# Patient Record
Sex: Female | Born: 1940 | Race: White | Hispanic: No | Smoking: Former smoker
Health system: Southern US, Community
[De-identification: ages and names within clinical notes are randomized; demographics above are authoritative.]

## PROBLEM LIST (undated history)

## (undated) DIAGNOSIS — I509 Heart failure, unspecified: Secondary | ICD-10-CM

## (undated) DIAGNOSIS — Z973 Presence of spectacles and contact lenses: Secondary | ICD-10-CM

## (undated) DIAGNOSIS — T8859XA Other complications of anesthesia, initial encounter: Secondary | ICD-10-CM

## (undated) DIAGNOSIS — K219 Gastro-esophageal reflux disease without esophagitis: Secondary | ICD-10-CM

## (undated) DIAGNOSIS — E119 Type 2 diabetes mellitus without complications: Secondary | ICD-10-CM

## (undated) DIAGNOSIS — K08109 Complete loss of teeth, unspecified cause, unspecified class: Secondary | ICD-10-CM

## (undated) DIAGNOSIS — K5792 Diverticulitis of intestine, part unspecified, without perforation or abscess without bleeding: Secondary | ICD-10-CM

## (undated) DIAGNOSIS — E039 Hypothyroidism, unspecified: Secondary | ICD-10-CM

## (undated) DIAGNOSIS — M7581 Other shoulder lesions, right shoulder: Secondary | ICD-10-CM

## (undated) DIAGNOSIS — L900 Lichen sclerosus et atrophicus: Secondary | ICD-10-CM

## (undated) DIAGNOSIS — I340 Nonrheumatic mitral (valve) insufficiency: Secondary | ICD-10-CM

## (undated) DIAGNOSIS — M1990 Unspecified osteoarthritis, unspecified site: Secondary | ICD-10-CM

## (undated) DIAGNOSIS — T4145XA Adverse effect of unspecified anesthetic, initial encounter: Secondary | ICD-10-CM

## (undated) DIAGNOSIS — M109 Gout, unspecified: Secondary | ICD-10-CM

## (undated) DIAGNOSIS — J449 Chronic obstructive pulmonary disease, unspecified: Secondary | ICD-10-CM

## (undated) DIAGNOSIS — Z972 Presence of dental prosthetic device (complete) (partial): Secondary | ICD-10-CM

## (undated) DIAGNOSIS — I10 Essential (primary) hypertension: Secondary | ICD-10-CM

## (undated) HISTORY — PX: KIDNEY SURGERY: SHX687

## (undated) HISTORY — PX: LAPAROTOMY: SHX154

## (undated) HISTORY — PX: TONSILLECTOMY: SUR1361

## (undated) HISTORY — PX: CHOLECYSTECTOMY: SHX55

## (undated) HISTORY — DX: Type 2 diabetes mellitus without complications: E11.9

## (undated) HISTORY — DX: Lichen sclerosus et atrophicus: L90.0

---

## 1965-07-02 HISTORY — PX: ABDOMINAL HYSTERECTOMY: SHX81

## 1972-07-02 HISTORY — PX: BREAST EXCISIONAL BIOPSY: SUR124

## 2004-07-02 HISTORY — PX: JOINT REPLACEMENT: SHX530

## 2006-07-02 HISTORY — PX: JOINT REPLACEMENT: SHX530

## 2010-07-02 HISTORY — PX: CORRECTION OVERLAPPING TOES: SHX6615

## 2013-07-02 HISTORY — PX: HERNIA REPAIR: SHX51

## 2014-03-31 DIAGNOSIS — K5790 Diverticulosis of intestine, part unspecified, without perforation or abscess without bleeding: Secondary | ICD-10-CM | POA: Insufficient documentation

## 2014-03-31 DIAGNOSIS — I10 Essential (primary) hypertension: Secondary | ICD-10-CM | POA: Insufficient documentation

## 2014-03-31 DIAGNOSIS — E039 Hypothyroidism, unspecified: Secondary | ICD-10-CM | POA: Insufficient documentation

## 2014-03-31 DIAGNOSIS — M109 Gout, unspecified: Secondary | ICD-10-CM | POA: Insufficient documentation

## 2014-03-31 DIAGNOSIS — K219 Gastro-esophageal reflux disease without esophagitis: Secondary | ICD-10-CM | POA: Insufficient documentation

## 2014-03-31 DIAGNOSIS — M8580 Other specified disorders of bone density and structure, unspecified site: Secondary | ICD-10-CM | POA: Insufficient documentation

## 2014-06-20 ENCOUNTER — Inpatient Hospital Stay: Payer: Self-pay | Admitting: Surgery

## 2014-06-20 LAB — URINALYSIS, COMPLETE
BACTERIA: NONE SEEN
BILIRUBIN, UR: NEGATIVE
Blood: NEGATIVE
Glucose,UR: NEGATIVE mg/dL (ref 0–75)
Hyaline Cast: 2
Nitrite: NEGATIVE
PH: 6 (ref 4.5–8.0)
Protein: 100
RBC,UR: 5 /HPF (ref 0–5)
Specific Gravity: 1.021 (ref 1.003–1.030)
Squamous Epithelial: 2
WBC UR: 27 /HPF (ref 0–5)

## 2014-06-20 LAB — COMPREHENSIVE METABOLIC PANEL
ANION GAP: 10 (ref 7–16)
Albumin: 2.4 g/dL — ABNORMAL LOW (ref 3.4–5.0)
Alkaline Phosphatase: 122 U/L — ABNORMAL HIGH
BUN: 6 mg/dL — ABNORMAL LOW (ref 7–18)
Bilirubin,Total: 0.8 mg/dL (ref 0.2–1.0)
CHLORIDE: 101 mmol/L (ref 98–107)
Calcium, Total: 8.2 mg/dL — ABNORMAL LOW (ref 8.5–10.1)
Co2: 26 mmol/L (ref 21–32)
Creatinine: 1.05 mg/dL (ref 0.60–1.30)
EGFR (African American): >60
GFR CALC NON AF AMER: 54 — AB
Glucose: 222 mg/dL — ABNORMAL HIGH (ref 65–99)
Osmolality: 278 (ref 275–301)
Potassium: 2.7 mmol/L — ABNORMAL LOW (ref 3.5–5.1)
SGOT(AST): 19 U/L (ref 15–37)
SGPT (ALT): 24 U/L
Sodium: 137 mmol/L (ref 136–145)
Total Protein: 6.7 g/dL (ref 6.4–8.2)

## 2014-06-20 LAB — CBC
HCT: 36.9 % (ref 35.0–47.0)
HGB: 11.8 g/dL — AB (ref 12.0–16.0)
MCH: 30.1 pg (ref 26.0–34.0)
MCHC: 32 g/dL (ref 32.0–36.0)
MCV: 94 fL (ref 80–100)
PLATELETS: 281 10*3/uL (ref 150–440)
RBC: 3.92 10*6/uL (ref 3.80–5.20)
RDW: 13.8 % (ref 11.5–14.5)
WBC: 21.2 10*3/uL — ABNORMAL HIGH (ref 3.6–11.0)

## 2014-06-20 LAB — LIPASE, BLOOD: LIPASE: 107 U/L (ref 73–393)

## 2014-06-21 LAB — CBC WITH DIFFERENTIAL/PLATELET
BASOS ABS: 0.1 10*3/uL (ref 0.0–0.1)
Basophil %: 0.3 %
Eosinophil #: 0 10*3/uL (ref 0.0–0.7)
Eosinophil %: 0.2 %
HCT: 32.8 % — ABNORMAL LOW (ref 35.0–47.0)
HGB: 10.8 g/dL — ABNORMAL LOW (ref 12.0–16.0)
LYMPHS ABS: 0.3 10*3/uL — AB (ref 1.0–3.6)
LYMPHS PCT: 1.8 %
MCH: 31.1 pg (ref 26.0–34.0)
MCHC: 32.8 g/dL (ref 32.0–36.0)
MCV: 95 fL (ref 80–100)
MONO ABS: 1.5 x10 3/mm — AB (ref 0.2–0.9)
Monocyte %: 8.4 %
NEUTROS PCT: 89.3 %
Neutrophil #: 16.2 10*3/uL — ABNORMAL HIGH (ref 1.4–6.5)
PLATELETS: 236 10*3/uL (ref 150–440)
RBC: 3.47 10*6/uL — ABNORMAL LOW (ref 3.80–5.20)
RDW: 14 % (ref 11.5–14.5)
WBC: 18.1 10*3/uL — AB (ref 3.6–11.0)

## 2014-06-21 LAB — BASIC METABOLIC PANEL
Anion Gap: 9 (ref 7–16)
BUN: 10 mg/dL (ref 7–18)
CHLORIDE: 105 mmol/L (ref 98–107)
CREATININE: 1.1 mg/dL (ref 0.60–1.30)
Calcium, Total: 7.7 mg/dL — ABNORMAL LOW (ref 8.5–10.1)
Co2: 24 mmol/L (ref 21–32)
EGFR (African American): >60
EGFR (Non-African Amer.): 52 — ABNORMAL LOW
Glucose: 175 mg/dL — ABNORMAL HIGH (ref 65–99)
Osmolality: 279 (ref 275–301)
Potassium: 3.4 mmol/L — ABNORMAL LOW (ref 3.5–5.1)
SODIUM: 138 mmol/L (ref 136–145)

## 2014-06-21 LAB — PROTIME-INR
INR: 1.4
Prothrombin Time: 16.9 secs — ABNORMAL HIGH (ref 11.5–14.7)

## 2014-06-21 LAB — APTT: Activated PTT: 35.4 secs (ref 23.6–35.9)

## 2014-06-21 LAB — CLOSTRIDIUM DIFFICILE(ARMC)

## 2014-06-23 LAB — BASIC METABOLIC PANEL
ANION GAP: 6 — AB (ref 7–16)
BUN: 8 mg/dL (ref 7–18)
CHLORIDE: 110 mmol/L — AB (ref 98–107)
Calcium, Total: 7.9 mg/dL — ABNORMAL LOW (ref 8.5–10.1)
Co2: 24 mmol/L (ref 21–32)
Creatinine: 0.94 mg/dL (ref 0.60–1.30)
EGFR (African American): >60
EGFR (Non-African Amer.): >60
GLUCOSE: 140 mg/dL — AB (ref 65–99)
Osmolality: 280 (ref 275–301)
POTASSIUM: 3.8 mmol/L (ref 3.5–5.1)
SODIUM: 140 mmol/L (ref 136–145)

## 2014-06-23 LAB — CBC WITH DIFFERENTIAL/PLATELET
BASOS ABS: 0 10*3/uL (ref 0.0–0.1)
BASOS PCT: 0.3 %
EOS ABS: 0.2 10*3/uL (ref 0.0–0.7)
Eosinophil %: 1.8 %
HCT: 29.6 % — ABNORMAL LOW (ref 35.0–47.0)
HGB: 9.7 g/dL — ABNORMAL LOW (ref 12.0–16.0)
Lymphocyte #: 0.9 10*3/uL — ABNORMAL LOW (ref 1.0–3.6)
Lymphocyte %: 9.9 %
MCH: 31 pg (ref 26.0–34.0)
MCHC: 32.8 g/dL (ref 32.0–36.0)
MCV: 95 fL (ref 80–100)
Monocyte #: 1 x10 3/mm — ABNORMAL HIGH (ref 0.2–0.9)
Monocyte %: 11.8 %
Neutrophil #: 6.6 10*3/uL — ABNORMAL HIGH (ref 1.4–6.5)
Neutrophil %: 76.2 %
PLATELETS: 212 10*3/uL (ref 150–440)
RBC: 3.13 10*6/uL — ABNORMAL LOW (ref 3.80–5.20)
RDW: 14.1 % (ref 11.5–14.5)
WBC: 8.7 10*3/uL (ref 3.6–11.0)

## 2014-06-24 LAB — BASIC METABOLIC PANEL
Anion Gap: 9 (ref 7–16)
BUN: 8 mg/dL (ref 7–18)
Calcium, Total: 7.8 mg/dL — ABNORMAL LOW (ref 8.5–10.1)
Chloride: 106 mmol/L (ref 98–107)
Co2: 26 mmol/L (ref 21–32)
Creatinine: 0.88 mg/dL (ref 0.60–1.30)
EGFR (African American): >60
EGFR (Non-African Amer.): >60
Glucose: 125 mg/dL — ABNORMAL HIGH (ref 65–99)
OSMOLALITY: 281 (ref 275–301)
Potassium: 2.9 mmol/L — ABNORMAL LOW (ref 3.5–5.1)
Sodium: 141 mmol/L (ref 136–145)

## 2014-06-24 LAB — CBC WITH DIFFERENTIAL/PLATELET
Basophil #: 0 10*3/uL (ref 0.0–0.1)
Basophil %: 0.4 %
EOS PCT: 1.5 %
Eosinophil #: 0.1 10*3/uL (ref 0.0–0.7)
HCT: 33.2 % — ABNORMAL LOW (ref 35.0–47.0)
HGB: 10.8 g/dL — ABNORMAL LOW (ref 12.0–16.0)
LYMPHS ABS: 0.8 10*3/uL — AB (ref 1.0–3.6)
Lymphocyte %: 10.5 %
MCH: 30.9 pg (ref 26.0–34.0)
MCHC: 32.5 g/dL (ref 32.0–36.0)
MCV: 95 fL (ref 80–100)
Monocyte #: 0.9 x10 3/mm (ref 0.2–0.9)
Monocyte %: 12.1 %
Neutrophil #: 5.5 10*3/uL (ref 1.4–6.5)
Neutrophil %: 75.5 %
Platelet: 231 10*3/uL (ref 150–440)
RBC: 3.49 10*6/uL — ABNORMAL LOW (ref 3.80–5.20)
RDW: 14.1 % (ref 11.5–14.5)
WBC: 7.2 10*3/uL (ref 3.6–11.0)

## 2014-06-24 LAB — MAGNESIUM: MAGNESIUM: 1.6 mg/dL — AB

## 2014-06-24 LAB — IRON AND TIBC
IRON SATURATION: 25 %
Iron Bind.Cap.(Total): 134 ug/dL — ABNORMAL LOW (ref 250–450)
Iron: 34 ug/dL — ABNORMAL LOW (ref 50–170)
Unbound Iron-Bind.Cap.: 100 ug/dL

## 2014-06-24 LAB — FERRITIN: FERRITIN (ARMC): 577 ng/mL — AB (ref 8–388)

## 2014-06-25 LAB — CBC WITH DIFFERENTIAL/PLATELET
Basophil #: 0.1 10*3/uL (ref 0.0–0.1)
Basophil %: 0.7 %
EOS ABS: 0.2 10*3/uL (ref 0.0–0.7)
Eosinophil %: 2.1 %
HCT: 35.6 % (ref 35.0–47.0)
HGB: 11.5 g/dL — AB (ref 12.0–16.0)
LYMPHS ABS: 1.3 10*3/uL (ref 1.0–3.6)
Lymphocyte %: 17.7 %
MCH: 30.9 pg (ref 26.0–34.0)
MCHC: 32.3 g/dL (ref 32.0–36.0)
MCV: 96 fL (ref 80–100)
MONO ABS: 1 x10 3/mm — AB (ref 0.2–0.9)
Monocyte %: 12.5 %
Neutrophil #: 5.1 10*3/uL (ref 1.4–6.5)
Neutrophil %: 67 %
PLATELETS: 275 10*3/uL (ref 150–440)
RBC: 3.72 10*6/uL — AB (ref 3.80–5.20)
RDW: 14.2 % (ref 11.5–14.5)
WBC: 7.6 10*3/uL (ref 3.6–11.0)

## 2014-06-25 LAB — URINALYSIS, COMPLETE
BILIRUBIN, UR: NEGATIVE
Glucose,UR: NEGATIVE mg/dL (ref 0–75)
KETONE: NEGATIVE
NITRITE: NEGATIVE
PH: 7 (ref 4.5–8.0)
Protein: NEGATIVE
RBC,UR: 5 /HPF (ref 0–5)
Specific Gravity: 1.002 (ref 1.003–1.030)
WBC UR: 9 /HPF (ref 0–5)

## 2014-06-25 LAB — COMPREHENSIVE METABOLIC PANEL
ALK PHOS: 115 U/L
ALT: 37 U/L
ANION GAP: 8 (ref 7–16)
Albumin: 1.9 g/dL — ABNORMAL LOW (ref 3.4–5.0)
BILIRUBIN TOTAL: 0.4 mg/dL (ref 0.2–1.0)
BUN: 8 mg/dL (ref 7–18)
CHLORIDE: 108 mmol/L — AB (ref 98–107)
CREATININE: 0.91 mg/dL (ref 0.60–1.30)
Calcium, Total: 8.4 mg/dL — ABNORMAL LOW (ref 8.5–10.1)
Co2: 29 mmol/L (ref 21–32)
EGFR (African American): >60
EGFR (Non-African Amer.): >60
Glucose: 110 mg/dL — ABNORMAL HIGH (ref 65–99)
Osmolality: 288 (ref 275–301)
Potassium: 4.4 mmol/L (ref 3.5–5.1)
SGOT(AST): 47 U/L — ABNORMAL HIGH (ref 15–37)
Sodium: 145 mmol/L (ref 136–145)
TOTAL PROTEIN: 5.8 g/dL — AB (ref 6.4–8.2)

## 2014-06-27 DIAGNOSIS — I340 Nonrheumatic mitral (valve) insufficiency: Secondary | ICD-10-CM

## 2014-06-27 LAB — CBC WITH DIFFERENTIAL/PLATELET
BASOS ABS: 0.1 10*3/uL (ref 0.0–0.1)
BASOS PCT: 0.8 %
Basophil #: 0.1 10*3/uL (ref 0.0–0.1)
Basophil %: 0.5 %
Eosinophil #: 0.2 10*3/uL (ref 0.0–0.7)
Eosinophil #: 0.2 10*3/uL (ref 0.0–0.7)
Eosinophil %: 1.9 %
Eosinophil %: 1.6 %
HCT: 36 % (ref 35.0–47.0)
HCT: 35.2 % (ref 35.0–47.0)
HGB: 11.6 g/dL — AB (ref 12.0–16.0)
HGB: 11.2 g/dL — ABNORMAL LOW (ref 12.0–16.0)
Lymphocyte #: 1.5 10*3/uL (ref 1.0–3.6)
Lymphocyte #: 0.9 10*3/uL — ABNORMAL LOW (ref 1.0–3.6)
Lymphocyte %: 12.8 %
Lymphocyte %: 8.4 %
MCH: 30.5 pg (ref 26.0–34.0)
MCH: 30.3 pg (ref 26.0–34.0)
MCHC: 32.2 g/dL (ref 32.0–36.0)
MCHC: 31.8 g/dL — ABNORMAL LOW (ref 32.0–36.0)
MCV: 95 fL (ref 80–100)
MCV: 95 fL (ref 80–100)
MONO ABS: 1.1 x10 3/mm — AB (ref 0.2–0.9)
Monocyte #: 1.2 x10 3/mm — ABNORMAL HIGH (ref 0.2–0.9)
Monocyte %: 10.4 %
Monocyte %: 10.4 %
NEUTROS PCT: 79.1 %
Neutrophil #: 8.6 10*3/uL — ABNORMAL HIGH (ref 1.4–6.5)
Neutrophil #: 8.3 10*3/uL — ABNORMAL HIGH (ref 1.4–6.5)
Neutrophil %: 74.1 %
Platelet: 309 10*3/uL (ref 150–440)
Platelet: 308 10*3/uL (ref 150–440)
RBC: 3.8 10*6/uL (ref 3.80–5.20)
RBC: 3.7 10*6/uL — ABNORMAL LOW (ref 3.80–5.20)
RDW: 14.4 % (ref 11.5–14.5)
RDW: 14.3 % (ref 11.5–14.5)
WBC: 11.6 10*3/uL — ABNORMAL HIGH (ref 3.6–11.0)
WBC: 10.5 10*3/uL (ref 3.6–11.0)

## 2014-06-27 LAB — COMPREHENSIVE METABOLIC PANEL
ALT: 30 U/L
ANION GAP: 9 (ref 7–16)
AST: 26 U/L (ref 15–37)
Albumin: 2 g/dL — ABNORMAL LOW (ref 3.4–5.0)
Alkaline Phosphatase: 106 U/L
BUN: 9 mg/dL (ref 7–18)
Bilirubin,Total: 0.4 mg/dL (ref 0.2–1.0)
CALCIUM: 8.5 mg/dL (ref 8.5–10.1)
CREATININE: 0.94 mg/dL (ref 0.60–1.30)
Chloride: 103 mmol/L (ref 98–107)
Co2: 27 mmol/L (ref 21–32)
EGFR (African American): >60
GLUCOSE: 104 mg/dL — AB (ref 65–99)
Osmolality: 277 (ref 275–301)
Potassium: 3.9 mmol/L (ref 3.5–5.1)
SODIUM: 139 mmol/L (ref 136–145)
Total Protein: 6.2 g/dL — ABNORMAL LOW (ref 6.4–8.2)

## 2014-06-29 LAB — URINE CULTURE

## 2014-06-30 LAB — MAGNESIUM: Magnesium: 2.4 mg/dL

## 2014-07-02 LAB — CREATININE, SERUM
Creatinine: 0.91 mg/dL (ref 0.60–1.30)
EGFR (African American): >60
EGFR (Non-African Amer.): >60

## 2014-07-03 HISTORY — PX: COLECTOMY WITH COLOSTOMY CREATION/HARTMANN PROCEDURE: SHX6598

## 2014-07-03 LAB — PLATELET COUNT: PLATELETS: 273 10*3/uL (ref 150–440)

## 2014-07-04 LAB — CBC WITH DIFFERENTIAL/PLATELET
Basophil #: 0 10*3/uL (ref 0.0–0.1)
Basophil %: 0.1 %
EOS ABS: 0 10*3/uL (ref 0.0–0.7)
Eosinophil %: 0 %
HCT: 30.5 % — ABNORMAL LOW (ref 35.0–47.0)
HGB: 9.6 g/dL — ABNORMAL LOW (ref 12.0–16.0)
Lymphocyte #: 0.5 10*3/uL — ABNORMAL LOW (ref 1.0–3.6)
Lymphocyte %: 2.9 %
MCH: 30.1 pg (ref 26.0–34.0)
MCHC: 31.6 g/dL — AB (ref 32.0–36.0)
MCV: 95 fL (ref 80–100)
MONO ABS: 1.2 x10 3/mm — AB (ref 0.2–0.9)
Monocyte %: 7 %
NEUTROS PCT: 90 %
Neutrophil #: 15.2 10*3/uL — ABNORMAL HIGH (ref 1.4–6.5)
Platelet: 263 10*3/uL (ref 150–440)
RBC: 3.21 10*6/uL — ABNORMAL LOW (ref 3.80–5.20)
RDW: 14.8 % — ABNORMAL HIGH (ref 11.5–14.5)
WBC: 16.9 10*3/uL — ABNORMAL HIGH (ref 3.6–11.0)

## 2014-07-04 LAB — COMPREHENSIVE METABOLIC PANEL
ALBUMIN: 1.8 g/dL — AB (ref 3.4–5.0)
ALK PHOS: 82 U/L
ALT: 19 U/L
Anion Gap: 5 — ABNORMAL LOW (ref 7–16)
BILIRUBIN TOTAL: 0.4 mg/dL (ref 0.2–1.0)
BUN: 17 mg/dL (ref 7–18)
CO2: 28 mmol/L (ref 21–32)
CREATININE: 0.86 mg/dL (ref 0.60–1.30)
Calcium, Total: 8.3 mg/dL — ABNORMAL LOW (ref 8.5–10.1)
Chloride: 105 mmol/L (ref 98–107)
EGFR (African American): >60
EGFR (Non-African Amer.): >60
Glucose: 153 mg/dL — ABNORMAL HIGH (ref 65–99)
Osmolality: 280 (ref 275–301)
Potassium: 4.9 mmol/L (ref 3.5–5.1)
SGOT(AST): 27 U/L (ref 15–37)
SODIUM: 138 mmol/L (ref 136–145)
TOTAL PROTEIN: 5.8 g/dL — AB (ref 6.4–8.2)

## 2014-07-05 LAB — CBC WITH DIFFERENTIAL/PLATELET
BASOS ABS: 0 10*3/uL (ref 0.0–0.1)
Basophil %: 0.1 %
Eosinophil #: 0 10*3/uL (ref 0.0–0.7)
Eosinophil %: 0 %
HCT: 28.9 % — ABNORMAL LOW (ref 35.0–47.0)
HGB: 9 g/dL — ABNORMAL LOW (ref 12.0–16.0)
LYMPHS ABS: 0.6 10*3/uL — AB (ref 1.0–3.6)
Lymphocyte %: 3.6 %
MCH: 29.5 pg (ref 26.0–34.0)
MCHC: 31 g/dL — AB (ref 32.0–36.0)
MCV: 95 fL (ref 80–100)
Monocyte #: 0.9 x10 3/mm (ref 0.2–0.9)
Monocyte %: 4.9 %
NEUTROS ABS: 16.1 10*3/uL — AB (ref 1.4–6.5)
Neutrophil %: 91.4 %
Platelet: 251 10*3/uL (ref 150–440)
RBC: 3.04 10*6/uL — ABNORMAL LOW (ref 3.80–5.20)
RDW: 14.7 % — AB (ref 11.5–14.5)
WBC: 17.6 10*3/uL — AB (ref 3.6–11.0)

## 2014-07-05 LAB — BASIC METABOLIC PANEL
ANION GAP: 5 — AB (ref 7–16)
BUN: 13 mg/dL (ref 7–18)
CO2: 32 mmol/L (ref 21–32)
Calcium, Total: 8.4 mg/dL — ABNORMAL LOW (ref 8.5–10.1)
Chloride: 101 mmol/L (ref 98–107)
Creatinine: 0.75 mg/dL (ref 0.60–1.30)
Glucose: 105 mg/dL — ABNORMAL HIGH (ref 65–99)
Osmolality: 276 (ref 275–301)
Potassium: 4.3 mmol/L (ref 3.5–5.1)
SODIUM: 138 mmol/L (ref 136–145)

## 2014-07-05 LAB — MAGNESIUM: MAGNESIUM: 1.9 mg/dL

## 2014-07-05 LAB — PHOSPHORUS: Phosphorus: 2.7 mg/dL (ref 2.5–4.9)

## 2014-07-06 LAB — TPN PANEL
ALBUMIN: 1.8 g/dL — AB (ref 3.4–5.0)
Activated PTT: 41.9 secs — ABNORMAL HIGH (ref 23.6–35.9)
Alkaline Phosphatase: 116 U/L
Anion Gap: 6 — ABNORMAL LOW (ref 7–16)
BUN: 13 mg/dL (ref 7–18)
CHLORIDE: 98 mmol/L (ref 98–107)
CHOLESTEROL: 147 mg/dL (ref 0–200)
Calcium, Total: 8.1 mg/dL — ABNORMAL LOW (ref 8.5–10.1)
Co2: 35 mmol/L — ABNORMAL HIGH (ref 21–32)
Creatinine: 0.93 mg/dL (ref 0.60–1.30)
Glucose: 131 mg/dL — ABNORMAL HIGH (ref 65–99)
HGB: 9.7 g/dL — ABNORMAL LOW (ref 12.0–16.0)
INR: 1.3
Magnesium: 1.9 mg/dL
Osmolality: 279 (ref 275–301)
PROTHROMBIN TIME: 15.9 s — AB (ref 11.5–14.7)
Phosphorus: 2.5 mg/dL (ref 2.5–4.9)
Platelet: 272 10*3/uL (ref 150–440)
Potassium: 4 mmol/L (ref 3.5–5.1)
SGOT(AST): 122 U/L — ABNORMAL HIGH (ref 15–37)
Sodium: 139 mmol/L (ref 136–145)
Total Protein: 5.3 g/dL — ABNORMAL LOW (ref 6.4–8.2)
Triglycerides: 194 mg/dL (ref 0–200)
WBC: 14.7 10*3/uL — ABNORMAL HIGH (ref 3.6–11.0)

## 2014-07-06 LAB — CBC WITH DIFFERENTIAL/PLATELET
BASOS ABS: 0 10*3/uL (ref 0.0–0.1)
Basophil %: 0.2 %
EOS PCT: 0.2 %
Eosinophil #: 0 10*3/uL (ref 0.0–0.7)
HCT: 30.3 % — AB (ref 35.0–47.0)
LYMPHS ABS: 1 10*3/uL (ref 1.0–3.6)
Lymphocyte %: 6.8 %
MCH: 30.4 pg (ref 26.0–34.0)
MCHC: 32.2 g/dL (ref 32.0–36.0)
MCV: 94 fL (ref 80–100)
MONOS PCT: 5 %
Monocyte #: 0.7 x10 3/mm (ref 0.2–0.9)
NEUTROS ABS: 12.9 10*3/uL — AB (ref 1.4–6.5)
Neutrophil %: 87.8 %
RBC: 3.21 10*6/uL — AB (ref 3.80–5.20)
RDW: 14.8 % — ABNORMAL HIGH (ref 11.5–14.5)

## 2014-07-07 LAB — BASIC METABOLIC PANEL
Anion Gap: 7 (ref 7–16)
BUN: 15 mg/dL (ref 7–18)
CALCIUM: 8.1 mg/dL — AB (ref 8.5–10.1)
CHLORIDE: 103 mmol/L (ref 98–107)
Co2: 32 mmol/L (ref 21–32)
Creatinine: 0.74 mg/dL (ref 0.60–1.30)
EGFR (African American): >60
EGFR (Non-African Amer.): >60
Glucose: 148 mg/dL — ABNORMAL HIGH (ref 65–99)
Osmolality: 287 (ref 275–301)
Potassium: 3.5 mmol/L (ref 3.5–5.1)
SODIUM: 142 mmol/L (ref 136–145)

## 2014-07-07 LAB — MAGNESIUM: Magnesium: 2 mg/dL

## 2014-07-07 LAB — PHOSPHORUS: Phosphorus: 2.7 mg/dL (ref 2.5–4.9)

## 2014-07-08 LAB — BASIC METABOLIC PANEL
Anion Gap: 6 — ABNORMAL LOW (ref 7–16)
BUN: 16 mg/dL (ref 7–18)
CHLORIDE: 102 mmol/L (ref 98–107)
CO2: 31 mmol/L (ref 21–32)
Calcium, Total: 8 mg/dL — ABNORMAL LOW (ref 8.5–10.1)
Creatinine: 0.96 mg/dL (ref 0.60–1.30)
EGFR (African American): >60
EGFR (Non-African Amer.): >60
Glucose: 185 mg/dL — ABNORMAL HIGH (ref 65–99)
OSMOLALITY: 284 (ref 275–301)
POTASSIUM: 3.6 mmol/L (ref 3.5–5.1)
Sodium: 139 mmol/L (ref 136–145)

## 2014-07-08 LAB — PHOSPHORUS: Phosphorus: 3 mg/dL (ref 2.5–4.9)

## 2014-07-08 LAB — MAGNESIUM: Magnesium: 2.1 mg/dL

## 2014-07-09 LAB — BASIC METABOLIC PANEL
ANION GAP: 5 — AB (ref 7–16)
BUN: 18 mg/dL (ref 7–18)
CHLORIDE: 106 mmol/L (ref 98–107)
CREATININE: 0.75 mg/dL (ref 0.60–1.30)
Calcium, Total: 8.1 mg/dL — ABNORMAL LOW (ref 8.5–10.1)
Co2: 31 mmol/L (ref 21–32)
EGFR (Non-African Amer.): >60
Glucose: 192 mg/dL — ABNORMAL HIGH (ref 65–99)
Osmolality: 290 (ref 275–301)
Potassium: 3.8 mmol/L (ref 3.5–5.1)
Sodium: 142 mmol/L (ref 136–145)

## 2014-07-09 LAB — PHOSPHORUS: Phosphorus: 2.9 mg/dL (ref 2.5–4.9)

## 2014-07-09 LAB — MAGNESIUM: Magnesium: 2.2 mg/dL

## 2014-07-10 LAB — BASIC METABOLIC PANEL
Anion Gap: 6 — ABNORMAL LOW (ref 7–16)
BUN: 19 mg/dL — AB (ref 7–18)
CO2: 29 mmol/L (ref 21–32)
Calcium, Total: 8.6 mg/dL (ref 8.5–10.1)
Chloride: 103 mmol/L (ref 98–107)
Creatinine: 0.8 mg/dL (ref 0.60–1.30)
EGFR (Non-African Amer.): >60
Glucose: 223 mg/dL — ABNORMAL HIGH (ref 65–99)
Osmolality: 285 (ref 275–301)
Potassium: 3.9 mmol/L (ref 3.5–5.1)
SODIUM: 138 mmol/L (ref 136–145)

## 2014-07-10 LAB — CBC WITH DIFFERENTIAL/PLATELET
BASOS PCT: 0.5 %
Basophil #: 0.1 10*3/uL (ref 0.0–0.1)
EOS ABS: 0.5 10*3/uL (ref 0.0–0.7)
Eosinophil %: 4.4 %
HCT: 27.9 % — AB (ref 35.0–47.0)
HGB: 9 g/dL — AB (ref 12.0–16.0)
Lymphocyte #: 1.1 10*3/uL (ref 1.0–3.6)
Lymphocyte %: 9.1 %
MCH: 30.3 pg (ref 26.0–34.0)
MCHC: 32.2 g/dL (ref 32.0–36.0)
MCV: 94 fL (ref 80–100)
Monocyte #: 1.1 x10 3/mm — ABNORMAL HIGH (ref 0.2–0.9)
Monocyte %: 9.5 %
NEUTROS ABS: 9.1 10*3/uL — AB (ref 1.4–6.5)
NEUTROS PCT: 76.5 %
Platelet: 291 10*3/uL (ref 150–440)
RBC: 2.96 10*6/uL — ABNORMAL LOW (ref 3.80–5.20)
RDW: 15 % — ABNORMAL HIGH (ref 11.5–14.5)
WBC: 11.9 10*3/uL — ABNORMAL HIGH (ref 3.6–11.0)

## 2014-07-10 LAB — PHOSPHORUS: Phosphorus: 2.7 mg/dL (ref 2.5–4.9)

## 2014-07-10 LAB — MAGNESIUM: MAGNESIUM: 2.1 mg/dL

## 2014-07-11 LAB — BASIC METABOLIC PANEL
ANION GAP: 6 — AB (ref 7–16)
BUN: 25 mg/dL — ABNORMAL HIGH (ref 7–18)
CO2: 28 mmol/L (ref 21–32)
CREATININE: 0.97 mg/dL (ref 0.60–1.30)
Calcium, Total: 7.9 mg/dL — ABNORMAL LOW (ref 8.5–10.1)
Chloride: 99 mmol/L (ref 98–107)
EGFR (African American): >60
EGFR (Non-African Amer.): 60 — ABNORMAL LOW
Glucose: 403 mg/dL — ABNORMAL HIGH (ref 65–99)
OSMOLALITY: 288 (ref 275–301)
POTASSIUM: 5 mmol/L (ref 3.5–5.1)
Sodium: 133 mmol/L — ABNORMAL LOW (ref 136–145)

## 2014-07-11 LAB — CBC WITH DIFFERENTIAL/PLATELET
BASOS ABS: 0.1 10*3/uL (ref 0.0–0.1)
Basophil %: 0.4 %
Eosinophil #: 0 10*3/uL (ref 0.0–0.7)
Eosinophil %: 0.1 %
HCT: 29.5 % — AB (ref 35.0–47.0)
HGB: 9.3 g/dL — ABNORMAL LOW (ref 12.0–16.0)
Lymphocyte #: 0.6 10*3/uL — ABNORMAL LOW (ref 1.0–3.6)
Lymphocyte %: 3 %
MCH: 29.7 pg (ref 26.0–34.0)
MCHC: 31.5 g/dL — AB (ref 32.0–36.0)
MCV: 94 fL (ref 80–100)
Monocyte #: 1.6 x10 3/mm — ABNORMAL HIGH (ref 0.2–0.9)
Monocyte %: 8.5 %
Neutrophil #: 16.6 10*3/uL — ABNORMAL HIGH (ref 1.4–6.5)
Neutrophil %: 88 %
PLATELETS: 310 10*3/uL (ref 150–440)
RBC: 3.13 10*6/uL — AB (ref 3.80–5.20)
RDW: 15.1 % — AB (ref 11.5–14.5)
WBC: 18.9 10*3/uL — AB (ref 3.6–11.0)

## 2014-07-11 LAB — PHOSPHORUS: Phosphorus: 3.8 mg/dL (ref 2.5–4.9)

## 2014-07-11 LAB — MAGNESIUM: Magnesium: 2.1 mg/dL

## 2014-07-11 LAB — POTASSIUM: Potassium: 4.5 mmol/L (ref 3.5–5.1)

## 2014-07-12 LAB — CBC WITH DIFFERENTIAL/PLATELET
BASOS ABS: 0 10*3/uL (ref 0.0–0.1)
Basophil %: 0.4 %
EOS PCT: 1.8 %
Eosinophil #: 0.2 10*3/uL (ref 0.0–0.7)
HCT: 26 % — AB (ref 35.0–47.0)
HGB: 8.2 g/dL — ABNORMAL LOW (ref 12.0–16.0)
LYMPHS PCT: 8.3 %
Lymphocyte #: 1.1 10*3/uL (ref 1.0–3.6)
MCH: 29.3 pg (ref 26.0–34.0)
MCHC: 31.4 g/dL — ABNORMAL LOW (ref 32.0–36.0)
MCV: 93 fL (ref 80–100)
MONOS PCT: 9.8 %
Monocyte #: 1.3 x10 3/mm — ABNORMAL HIGH (ref 0.2–0.9)
Neutrophil #: 10.5 10*3/uL — ABNORMAL HIGH (ref 1.4–6.5)
Neutrophil %: 79.7 %
Platelet: 284 10*3/uL (ref 150–440)
RBC: 2.79 10*6/uL — AB (ref 3.80–5.20)
RDW: 14.9 % — AB (ref 11.5–14.5)
WBC: 13.2 10*3/uL — AB (ref 3.6–11.0)

## 2014-07-12 LAB — BASIC METABOLIC PANEL
Anion Gap: 3 — ABNORMAL LOW (ref 7–16)
BUN: 24 mg/dL — ABNORMAL HIGH (ref 7–18)
CALCIUM: 8.4 mg/dL — AB (ref 8.5–10.1)
CHLORIDE: 104 mmol/L (ref 98–107)
Co2: 32 mmol/L (ref 21–32)
Creatinine: 0.82 mg/dL (ref 0.60–1.30)
EGFR (African American): >60
GLUCOSE: 199 mg/dL — AB (ref 65–99)
OSMOLALITY: 287 (ref 275–301)
POTASSIUM: 4.5 mmol/L (ref 3.5–5.1)
SODIUM: 139 mmol/L (ref 136–145)

## 2014-07-12 LAB — COMPREHENSIVE METABOLIC PANEL
ALBUMIN: 1.4 g/dL — AB (ref 3.4–5.0)
Alkaline Phosphatase: 135 U/L — ABNORMAL HIGH
BILIRUBIN TOTAL: 0.4 mg/dL (ref 0.2–1.0)
SGOT(AST): 26 U/L (ref 15–37)
SGPT (ALT): 43 U/L
Total Protein: 5.7 g/dL — ABNORMAL LOW (ref 6.4–8.2)

## 2014-07-12 LAB — PHOSPHORUS: PHOSPHORUS: 3.1 mg/dL (ref 2.5–4.9)

## 2014-07-12 LAB — MAGNESIUM: Magnesium: 2 mg/dL

## 2014-07-13 LAB — PHOSPHORUS: Phosphorus: 3.2 mg/dL (ref 2.5–4.9)

## 2014-07-13 LAB — CBC WITH DIFFERENTIAL/PLATELET
BASOS PCT: 0.6 %
Basophil #: 0.1 10*3/uL (ref 0.0–0.1)
EOS PCT: 3.5 %
Eosinophil #: 0.4 10*3/uL (ref 0.0–0.7)
HCT: 24.9 % — AB (ref 35.0–47.0)
HGB: 7.8 g/dL — AB (ref 12.0–16.0)
LYMPHS PCT: 9.5 %
Lymphocyte #: 1.2 10*3/uL (ref 1.0–3.6)
MCH: 29.4 pg (ref 26.0–34.0)
MCHC: 31.2 g/dL — ABNORMAL LOW (ref 32.0–36.0)
MCV: 94 fL (ref 80–100)
MONOS PCT: 9.6 %
Monocyte #: 1.2 x10 3/mm — ABNORMAL HIGH (ref 0.2–0.9)
NEUTROS ABS: 9.9 10*3/uL — AB (ref 1.4–6.5)
Neutrophil %: 76.8 %
Platelet: 276 10*3/uL (ref 150–440)
RBC: 2.65 10*6/uL — AB (ref 3.80–5.20)
RDW: 15.2 % — ABNORMAL HIGH (ref 11.5–14.5)
WBC: 12.8 10*3/uL — ABNORMAL HIGH (ref 3.6–11.0)

## 2014-07-13 LAB — BASIC METABOLIC PANEL
ANION GAP: 6 — AB (ref 7–16)
BUN: 20 mg/dL — AB (ref 7–18)
CO2: 29 mmol/L (ref 21–32)
CREATININE: 0.84 mg/dL (ref 0.60–1.30)
Calcium, Total: 7.8 mg/dL — ABNORMAL LOW (ref 8.5–10.1)
Chloride: 104 mmol/L (ref 98–107)
Glucose: 181 mg/dL — ABNORMAL HIGH (ref 65–99)
Osmolality: 285 (ref 275–301)
Potassium: 4.3 mmol/L (ref 3.5–5.1)
Sodium: 139 mmol/L (ref 136–145)

## 2014-07-13 LAB — MAGNESIUM: Magnesium: 1.8 mg/dL

## 2014-07-14 LAB — BASIC METABOLIC PANEL
Anion Gap: 6 — ABNORMAL LOW (ref 7–16)
BUN: 18 mg/dL (ref 7–18)
CALCIUM: 8.2 mg/dL — AB (ref 8.5–10.1)
CO2: 28 mmol/L (ref 21–32)
Chloride: 101 mmol/L (ref 98–107)
Creatinine: 0.82 mg/dL (ref 0.60–1.30)
GLUCOSE: 184 mg/dL — AB (ref 65–99)
OSMOLALITY: 277 (ref 275–301)
Potassium: 4.1 mmol/L (ref 3.5–5.1)
SODIUM: 135 mmol/L — AB (ref 136–145)

## 2014-07-14 LAB — HEMOGLOBIN: HGB: 8 g/dL — ABNORMAL LOW (ref 12.0–16.0)

## 2014-07-14 LAB — MAGNESIUM: Magnesium: 1.8 mg/dL

## 2014-07-14 LAB — PHOSPHORUS: Phosphorus: 3.3 mg/dL

## 2014-07-15 LAB — BASIC METABOLIC PANEL
ANION GAP: 8 (ref 7–16)
BUN: 16 mg/dL (ref 7–18)
CREATININE: 0.75 mg/dL (ref 0.60–1.30)
Calcium, Total: 8.4 mg/dL — ABNORMAL LOW (ref 8.5–10.1)
Chloride: 103 mmol/L (ref 98–107)
Co2: 26 mmol/L (ref 21–32)
EGFR (African American): >60
Glucose: 179 mg/dL — ABNORMAL HIGH (ref 65–99)
Osmolality: 279 (ref 275–301)
Potassium: 4.1 mmol/L (ref 3.5–5.1)
Sodium: 137 mmol/L (ref 136–145)

## 2014-07-15 LAB — CBC WITH DIFFERENTIAL/PLATELET
Eosinophil: 3 %
HCT: 25.5 % — ABNORMAL LOW (ref 35.0–47.0)
HGB: 8.1 g/dL — ABNORMAL LOW (ref 12.0–16.0)
LYMPHS PCT: 8 %
MCH: 29.6 pg (ref 26.0–34.0)
MCHC: 31.7 g/dL — ABNORMAL LOW (ref 32.0–36.0)
MCV: 93 fL (ref 80–100)
MONOS PCT: 4 %
MYELOCYTE: 2 %
Metamyelocyte: 2 %
Platelet: 329 10*3/uL (ref 150–440)
RBC: 2.73 10*6/uL — ABNORMAL LOW (ref 3.80–5.20)
RDW: 14.9 % — AB (ref 11.5–14.5)
SEGMENTED NEUTROPHILS: 81 %
WBC: 14.4 10*3/uL — AB (ref 3.6–11.0)

## 2014-07-15 LAB — MAGNESIUM: Magnesium: 2 mg/dL

## 2014-07-15 LAB — PHOSPHORUS: Phosphorus: 3.3 mg/dL (ref 2.5–4.9)

## 2014-07-16 LAB — MAGNESIUM: Magnesium: 2.1 mg/dL

## 2014-07-16 LAB — BASIC METABOLIC PANEL
Anion Gap: 6 — ABNORMAL LOW (ref 7–16)
BUN: 21 mg/dL — AB (ref 7–18)
Calcium, Total: 8.2 mg/dL — ABNORMAL LOW (ref 8.5–10.1)
Chloride: 105 mmol/L (ref 98–107)
Co2: 27 mmol/L (ref 21–32)
Creatinine: 0.78 mg/dL (ref 0.60–1.30)
GLUCOSE: 179 mg/dL — AB (ref 65–99)
OSMOLALITY: 283 (ref 275–301)
Potassium: 4.3 mmol/L (ref 3.5–5.1)
Sodium: 138 mmol/L (ref 136–145)

## 2014-07-16 LAB — PHOSPHORUS: Phosphorus: 3.5 mg/dL (ref 2.5–4.9)

## 2014-07-18 LAB — MAGNESIUM: MAGNESIUM: 2.1 mg/dL

## 2014-07-18 LAB — CALCIUM: CALCIUM: 8.8 mg/dL (ref 8.5–10.1)

## 2014-07-18 LAB — PHOSPHORUS: Phosphorus: 4.6 mg/dL (ref 2.5–4.9)

## 2014-07-18 LAB — POTASSIUM: Potassium: 4.2 mmol/L (ref 3.5–5.1)

## 2014-07-18 LAB — SODIUM: Sodium: 137 mmol/L (ref 136–145)

## 2014-07-19 LAB — BASIC METABOLIC PANEL
ANION GAP: 5 — AB (ref 7–16)
BUN: 22 mg/dL — ABNORMAL HIGH (ref 7–18)
CO2: 27 mmol/L (ref 21–32)
CREATININE: 0.74 mg/dL (ref 0.60–1.30)
Calcium, Total: 8.5 mg/dL (ref 8.5–10.1)
Chloride: 104 mmol/L (ref 98–107)
Glucose: 126 mg/dL — ABNORMAL HIGH (ref 65–99)
Osmolality: 277 (ref 275–301)
POTASSIUM: 4.5 mmol/L (ref 3.5–5.1)
SODIUM: 136 mmol/L (ref 136–145)

## 2014-07-19 LAB — CBC WITH DIFFERENTIAL/PLATELET
BASOS ABS: 0.1 10*3/uL (ref 0.0–0.1)
Basophil %: 0.8 %
EOS ABS: 0.5 10*3/uL (ref 0.0–0.7)
Eosinophil %: 3.7 %
HCT: 25.5 % — ABNORMAL LOW (ref 35.0–47.0)
HGB: 8.1 g/dL — AB (ref 12.0–16.0)
Lymphocyte #: 1.7 10*3/uL (ref 1.0–3.6)
Lymphocyte %: 13.9 %
MCH: 28.6 pg (ref 26.0–34.0)
MCHC: 31.8 g/dL — ABNORMAL LOW (ref 32.0–36.0)
MCV: 90 fL (ref 80–100)
Monocyte #: 1.2 x10 3/mm — ABNORMAL HIGH (ref 0.2–0.9)
Monocyte %: 10 %
NEUTROS ABS: 8.8 10*3/uL — AB (ref 1.4–6.5)
Neutrophil %: 71.6 %
Platelet: 392 10*3/uL (ref 150–440)
RBC: 2.83 10*6/uL — AB (ref 3.80–5.20)
RDW: 15.7 % — ABNORMAL HIGH (ref 11.5–14.5)
WBC: 12.4 10*3/uL — ABNORMAL HIGH (ref 3.6–11.0)

## 2014-10-23 NOTE — H&P (Signed)
History of Present Illness 28 yowf, retired Therapist, sports, who had some LLQ abdominal pain a week ago, which mostly resolved. Her pain markedly worsened last PM, though. She has been having small, soft BMs every day for the last week, including one this AM. She had one episode of diarrhea Wednesday. She has also experienced some bronchitis Sx over the last week, but she denmies, fever, and any productive cough. She has had chills.   Past Med/Surgical Hx:  gout:   arthritis:   Hypothyroidism:   hypertension:   Diverticulosis:   Cholecystectomy:   right kidney surgery:   Tonsillectomy:   Hysterectomy - Total:   ALLERGIES:  IVP Dye: Anaphylaxis  Shellfish: Anaphylaxis  Morphine: Resp Arrest  Macrodantin: N/V/Diarrhea  Other -Explain in Comment: Hypotension  Family and Social History:  Family History Non-Contributory   Social History negative tobacco, negative ETOH, negative Illicit drugs, widowed since 2002, retired from rehab nursing 4 years ago, quit smoking 15 years ago, no family, she does have friends from church who can support her, however.   + Tobacco Prior (greater than 1 year)   Place of Living Home  lives alone   Review of Systems:  Fever/Chills Yes   Cough Yes   Sputum No   Abdominal Pain Yes   Diarrhea Yes   Constipation No   Nausea/Vomiting No   SOB/DOE No   Chest Pain No   Dysuria No   Tolerating PT Yes   Tolerating Diet Yes   Medications/Allergies Reviewed Medications/Allergies reviewed   Physical Exam:  GEN well developed, well nourished, no acute distress, obese   HEENT pink conjunctivae, PERRL, hearing intact to voice, moist oral mucosa, Oropharynx clear   NECK supple  trachea midline   RESP normal resp effort  clear BS  no use of accessory muscles   CARD regular rate  no murmur  no JVD  no Rub   ABD positive tenderness  obese, exquisite LLQ tenderness   GU superpubic tenderness   EXTR negative cyanosis/clubbing, negative edema   SKIN  normal to palpation, skin turgor good   NEURO cranial nerves intact, negative tremor, follows commands, motor/sensory function intact   PSYCH alert, A+O to time, place, person, good insight   Lab Results:  Hepatic:  20-Dec-15 08:15   Bilirubin, Total 0.8  Alkaline Phosphatase  122 (46-116 NOTE: New Reference Range 01/19/14)  SGPT (ALT) 24 (14-63 NOTE: New Reference Range 01/19/14)  SGOT (AST) 19  Total Protein, Serum 6.7  Albumin, Serum  2.4  Routine Chem:  20-Dec-15 08:15   Lipase 107 (Result(s) reported on 20 Jun 2014 at 08:40AM.)  Glucose, Serum  222  BUN  6  Creatinine (comp) 1.05  Sodium, Serum 137  Potassium, Serum  2.7  Chloride, Serum 101  CO2, Serum 26  Calcium (Total), Serum  8.2  Osmolality (calc) 278  eGFR (African American) >60  eGFR (Non-African American)  54 (eGFR values <39m/min/1.73 m2 may be an indication of chronic kidney disease (CKD). Calculated eGFR, using the MRDR Study equation, is useful in  patients with stable renal function. The eGFR calculation will not be reliable in acutely ill patients when serum creatinine is changing rapidly. It is not useful in patients on dialysis. The eGFR calculation may not be applicable to patients at the low and high extremes of body sizes, pregnant women, and vegetarians.)  Anion Gap 10  Routine UA:  20-Dec-15 08:15   Color (UA) Amber  Clarity (UA) Hazy  Glucose (UA) Negative  Bilirubin (UA) Negative  Ketones (UA) 1+  Specific Gravity (UA) 1.021  Blood (UA) Negative  pH (UA) 6.0  Protein (UA) 100 mg/dL  Nitrite (UA) Negative  Leukocyte Esterase (UA) Trace (Result(s) reported on 20 Jun 2014 at 08:32AM.)  RBC (UA) 5 /HPF  WBC (UA) 27 /HPF  Bacteria (UA) NONE SEEN  Epithelial Cells (UA) 2 /HPF  Mucous (UA) PRESENT  Hyaline Cast (UA) 2 /LPF (Result(s) reported on 20 Jun 2014 at 08:32AM.)  Routine Hem:  20-Dec-15 08:15   WBC (CBC)  21.2  RBC (CBC) 3.92  Hemoglobin (CBC)  11.8  Hematocrit (CBC)  36.9  Platelet Count (CBC) 281 (Result(s) reported on 20 Jun 2014 at 08:41AM.)  MCV 94  MCH 30.1  MCHC 32.0  RDW 13.8   Radiology Results: LabUnknown:    20-Dec-15 10:42, CT Abdomen and Pelvis Without Contrast  PACS Image  CT:  CT Abdomen and Pelvis Without Contrast  REASON FOR EXAM:    (1) leukocytosis, llq pain; (2) leukocytosis, llq   pain;    NOTE: Nursing to Give  COMMENTS:       PROCEDURE: CT  - CT ABDOMEN AND PELVIS W0  - Jun 20 2014 10:42AM     CLINICAL DATA:  Abdominal pain for 1 week.  Brown urine.    EXAM:  CT ABDOMEN AND PELVIS WITHOUT CONTRAST    TECHNIQUE:  Multidetector CT imaging of the abdomen and pelvis was performed  following the standard protocol without IV contrast.  COMPARISON:  None.    FINDINGS:  Right basilar densities are most compatible with mild atelectasis.  No evidence for pleural effusions.    Unenhanced CT was performed per clinician order. Lack of IV contrast  limits sensitivity and specificity, especially for evaluation of  abdominal/pelvic solid viscera.    Gallbladder has been removed. No gross abnormality to the liver,  spleen, pancreas or adrenal glands. Negative for kidney stones or  hydronephrosis. No gross abnormality to the stomach or small bowel.    Small lymph nodes in the periaortic region are nonspecific.There is  extensive inflammation centered around the sigmoid colon with  extraluminal gas. There is a poorly defined gas-filled collection  between the sigmoid colon and the urinary bladder on sequence 2,  image 77. This collection roughly measures 4.2 x 3.3 cm and likely  represents an abscess. Additional gas-filled collections anterior to  the sigmoid colon are compatible with developing abscess  collections. These anterior collections are poorly defined and  mostly contain air.    There is no significant free fluid in the pelvis. Uterus has been  removed. No gross abnormality to the adnexal  regions.    Degenerative changes at the pubic symphysis. Degenerative facet  disease in lower lumbar spine. There appears to be a large Schmorl's  nodealong the inferior endplate of L2. There is endplate sclerosis  in the adjacent L3 vertebral body and marked disc space narrowing at  this level. There is no significant soft tissue swelling at the  level of L2-L3.     IMPRESSION:  Inflammation centered around the sigmoid colon with extraluminal gas  collections. Findings are most compatible with sigmoid  diverticulitis and pericolonic abscess collections. The most  well-formed collection is situated between the sigmoid colon and  urinary bladder.    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    These results were called by telephone at the time of interpretation  on 06/20/2014 at 11:03 am to Dr. Herbie Baltimore  Corky Downs , who verbally  acknowledged these results.      Electronically Signed    By: Markus Daft M.D.    On: 06/20/2014 11:06         Verified By: Burman Riis, M.D.,    Assessment/Admission Diagnosis Contained, perforated, sigmoid diverticulitis, with developing phlegmon Hyperglycemia Hypokalemia   Plan Admit, IVF, IV ABx Correct K+ Monitor BSs Clear liquid diet; Lactulose Pt understands that if she worsens clinically, or she develops an abscess that is undrainable, she may need a Hartmann's procedure, but that, ideally, her diverticulitis can be treated medically, such that she can undergo a one stage sigmoid colectomy.   Electronic Signatures: Consuela Mimes (MD)  (Signed 20-Dec-15 11:54)  Authored: CHIEF COMPLAINT and HISTORY, PAST MEDICAL/SURGIAL HISTORY, ALLERGIES, HOME MEDICATIONS, FAMILY AND SOCIAL HISTORY, REVIEW OF SYSTEMS, PHYSICAL EXAM, LABS, Radiology, ASSESSMENT AND PLAN   Last Updated: 20-Dec-15 11:54 by Consuela Mimes (MD)

## 2014-10-23 NOTE — Consult Note (Signed)
PATIENT NAME:  Martha Chung, Martha Chung MR#:  [id number] DATE OF BIRTH:  [birth year]  DATE OF CONSULTATION:  06/23/2014  REFERRING PHYSICIAN:   CONSULTING PHYSICIAN:  Ena Dawley. Clent Ridges, MD  CONSULTING PHYSICIAN:  Dr. Egbert Garibaldi.    REASON FOR CONSULTATION: Shortness of breath.   HISTORY OF PRESENT ILLNESS: This 74 year old Caucasian woman with past medical history of congestive heart failure, hypertension, was initially admitted on December 20 to the surgical service for treatment of contained perforated sigmoid diverticulitis with developing phlegmon. She is being treated conservatively with antibiotics in the hopes that she can avoid a Hartman procedure. Currently doing well with no fever, decreased leukocytosis and decreased abdominal pain. She has noted over the past few days increasing shortness of breath which has required increasing amounts of supplemental oxygen to maintain oxygen saturations. She is now on 3 liters via nasal cannula. At home she is not on supplemental oxygen. She reports that she has a history of congestive heart failure, does not know if this is systolic or diastolic. Reports that she has had a recent 2-D echocardiogram and stress test by her cardiologist in [place]. Hospitalist services are asked to follow for medical management.   PAST MEDICAL HISTORY:  1.  Congestive heart failure, unknown whether this is systolic or diastolic.  2.  Hypertension.  3.  Diverticulitis.  4.  Gout.  5.  Hypothyroidism.  6.  Osteoarthritis.  7.  Obesity.   PAST SURGICAL HISTORY:  1.  Cholecystectomy.  2.  Right kidney surgery.  3.  Tonsillectomy.  4.  Hysterectomy total.   ALLERGIES: IV DYE, SHELLFISH, MORPHINE, MACRODANTIN.   HOME MEDICATIONS:  1. Metoprolol tartrate 50 mg twice a day.  2. Meloxicam 7.5 mg twice a day.  3. Levothyroxine 75 mcg 1 tablet once a day.  4. Lasix 80 mg 1 tablet once a day.  5. Allopurinol 300 mg 1 tablet once a day.   SOCIAL HISTORY: The patient has just  moved to [place] from [place] 4 months ago. She lives alone. She does not have any family in the area. She does not smoke cigarettes or drink alcohol.   FAMILY MEDICAL HISTORY: Negative for heart disease, diabetes, and stroke.   REVIEW OF SYSTEMS: Negative for fevers. Positive for chills, positive for subjective weight gain, positive for shortness of breath. No coughing, sputum production, orthopnea, paroxysmal nocturnal dyspnea, hemoptysis, no nausea, vomiting, diarrhea, no dysuria or frequency, no joint pain or swelling, no focal numbness or weakness, no vision, change, no headache, no chest pain, palpitations or syncope.   PHYSICAL EXAMINATION:  VITAL SIGNS: Temperature 98.1, pulse 81, respirations 26, blood pressure 162/74, oxygenation 94% on 3 liters via nasal cannula.  GENERAL: No acute distress.  HEENT: Pupils equal, round, and reactive to light, conjunctivae clear, extraocular motion intact, mucous membranes pink and moist, good dentition, posterior oropharynx clear, no exudate, erythema, or edema.  NECK: No cervical lymphadenopathy, trachea is midline.  PULMONARY: There are bibasilar crackles about 1/4 of the way up the lung fields, good air movement, mild respiratory distress.  CARDIOVASCULAR: Regular rate and rhythm. No murmurs, rubs, or gallops, no peripheral edema., peripheral pulses are 2 +.  ABDOMEN: Obese, nontender, nondistended, no guarding, no hepatosplenomegaly, bowel sounds are normal.  MUSCULOSKELETAL: No joint effusions, range of motion normal, strength 5 out of 5 throughout.  NEUROLOGIC: Cranial nerves II through XII grossly intact. Sensation and strength intact. PSYCHIATRIC: The patient alert and oriented with good insight into her clinical condition, no uncontrolled anxiety or  depression.   LABORATORY DATA: Sodium 140, potassium 3.8, chloride 110, bicarbonate 24, BUN 8, creatinine 0.94, glucose 140. LFTs normal with the exception of a decreased serum albumin at 2.4  and an elevated alkaline phosphatase.   White blood cells 8.7, hemoglobin 9.7, hematocrit 29.6, platelets 212,000, MCV 95. Clostridium difficile is negative. UA is positive with 27 white blood cells per high-powered field.   IMAGING: CT scan of the abdomen December 20 shows inflammation centered around the sigmoid colon with extraluminal gas collections. Findings most compatible with sigmoid diverticulitis and pericolonic abscess collection. Most well formed collection is situated between the sigmoid colon and urinary bladder.   Chest x-ray on 06/20/2014 shows no active cardiopulmonary disease.   ASSESSMENT AND PLAN:  1.  Acute respiratory failure with hypoxia: I think this is likely due to a congestive heart failure exacerbation in the setting of increased fluid intake and missing several doses of Lasix. I will get a chest x-ray. She does not have leukocytosis or fever to suggest new infection. Agree with restarting Lasix at 80 mg daily. We will start low sodium diet. We will check daily weights, monitor Is and Os. If additional diuresis is needed could add 20 mg of Lasix IV tomorrow.  2.  Congestive heart failure: I have requested records from her former cardiologist. This will be useful in case she needs surgery. She reports that she recently had a stress test which was normal and a 2-D echocardiogram which showed heart failure. She is not sure if this is systolic or diastolic.   3.  Urinary tract infection: The patient is on clindamycin as well as Rocephin which should cover most common causes of urinary tract infection. I will not order a urine culture at this time as she has been on antibiotics for several days now.  4.  Perforated diverticula colitis with abscess: Per primary team. Currently on antibiotics and seems to be doing very well with decreasing pain, leukocytosis, and fever.  5.  Hypertension: Blood pressure is elevated today in the setting of volume overload. I suspect that this will  improve with diuresis.  6.  Anemia: She has had progressive anemia. I will add on iron studies. I suspect that the anemia is due to dilution with volume overload.   Thank you for this consultation. We will continue to follow along in the care of this patient.   TIME SPENT ON CONSULTATION: 40 minutes.     ____________________________ Ena Dawley. Clent Ridges, MD cpw:bu D: 06/23/2014 19:22:22 ET T: 06/23/2014 19:31:57 ET JOB#: [id number]  cc: Santina Evans P. Clent Ridges, MD, <Dictator> Gale Journey MD ELECTRONICALLY SIGNED 07/01/2014 23:32

## 2014-10-23 NOTE — Consult Note (Signed)
Brief Consult Note: Diagnosis: diverticulitis with microperf, esbl on UCX.   Patient was seen by consultant.   Consult note dictated.   Recommend further assessment or treatment.   Orders entered.   Comments: Cont meropenem Recheck ua and ucx If clinically improving could transition to orals or send home on IV meropenem.  Electronic Signatures: Dierdre Harness (MD)  (Signed 30-Dec-15 15:15)  Authored: Brief Consult Note   Last Updated: 30-Dec-15 15:15 by Dierdre Harness (MD)

## 2014-10-25 LAB — SURGICAL PATHOLOGY

## 2014-10-27 NOTE — Consult Note (Signed)
PATIENT NAME:  Martha Chung, INFANTINO MR#:  [id number] DATE OF BIRTH:  07-10-1940  DATE OF CONSULTATION:  06/30/2014  REFERRING PHYSICIAN:  Cristal Deer A. Lundquist, MD  CONSULTING PHYSICIAN:  Stann Mainland. 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 She was admitted December 20th to the surgical service with left lower quadrant abdominal pain for 1 week, markedly worsening the day prior to admission. She was found on CT scan to have a contained perforated sigmoid diverticulitis with developing phlegmon. The patient was treated with IV antibiotics. Blood cultures have been negative. She has defervesced. Her white count, which was initially quite elevated, has normalized. However, her abdominal pain has persisted. Follow-up CT showed actually some mild improvement of her findings. We are consulted for further antibiotic management. Of note, the patient had a urine culture done December 25th, which grew 25,000 colonies of many ESBL Escherichia coli, as well as 10,000 colonies of Candida. Urinalysis at that time only showed 9 white cells.   PAST MEDICAL HISTORY:  1. Prior diverticulosis and diverticulitis.  2. Gout.  3. Arthritis.  4. Hypothyroidism.  5. Hypertension.   PAST SURGICAL HISTORY: Cholecystectomy, right kidney surgery, tonsillectomy, hysterectomy.   SOCIAL HISTORY: She moved to [place] from [place]. She lives alone, does not have any family in the area, does not smoke or drink. She is a retired Astronomer.   FAMILY HISTORY: Noncontributory.   ALLERGIES: SHE IS ALLERGIC TO IV DYE SHELLFISH, MORPHINE, AND MACRODANTIN.   REVIEW OF SYSTEMS: Eleven systems reviewed and negative except as per HPI.    ANTIBIOTICS SINCE ADMISSION: Include ceftriaxone,  December 20th through the 26th; clindamycin given December 19th through the 27th; fluconazole, December 26th through the 30th; meropenem, begun December 26th through the 30th.   PHYSICAL EXAMINATION: VITAL SIGNS: Temperature is 98.1, pulse 73, blood pressure 115/66, respirations 18, sats 95% on room air.  GENERAL: She is obese, lying in bed, in no acute distress.  HEENT: Pupils equal, round, reactive to light and accommodation. Extraocular movements are intact. Sclerae are anicteric. Oropharynx is clear. Her neck is supple.  HEART: Regular.  LUNGS: Clear.  ABDOMEN: Obese, soft, tender to palpation, especially left lower quadrant.  EXTREMITIES: No clubbing, cyanosis or edema.  NEUROLOGIC: She is alert and oriented x 3, grossly nonfocal neuro exam.   DIAGNOSTIC DATA: Micro data. Urine culture December 25th showed an ESBL Escherichia coli 25,000 colony-forming units, sensitive to Macrobid, gentamicin, imipenem, cefoxitin, and ertapenem. There were also 10,000 colonies of Candida albicans. Clostridium difficile done December 21st was negative. White blood count on admission was 21,000. By the 25th, it was down to 7.6 and currently, it is 10.5, hemoglobin 11.2, platelets 308. Urinalysis December 25th showed 9 white cells. Clostridium difficile was negative. Renal function is normal with a creatinine 0.94. LFTs are normal, except low albumin at 2.0. Imaging: CT of his abdomen done December 20th showed inflammation centered around the sigmoid colon with extraluminal gas collections compatible with sigmoid diverticulitis pericolonic abscess collection. There is multilevel degenerative disease in the lumbar spine. Follow-up CT September 29th, shows acute sigmoid diverticulitis persisting, but decreased in size of pelvic abscesses.   IMPRESSION: A 74 year old female with history of recurrent diverticulosis and issues in the past with diverticulitis, admitted with perforated sigmoid diverticulitis. She has been  treated with IV  antibiotics. Initially, was on ceftriaxone and clindamycin, and her white blood count improved to normal from 20,000 and her fevers improved. When she grew an ESBL Escherichia coli in her urine, she was switched to meropenem. Clinically, she has had very slow improvement; however, I do not think this is due to failure of antibiotic coverage, more to the inflammation in the area. She may eventually need surgery.   RECOMMENDATIONS: 1.  We could either continue her on IV antibiotics as an outpatient, when she is ready to go, with meropenem; however, this would be q. 8. I would not suggest ertapenem in this case, since it will not cover enterococcus.  2.  Another option, if she continues to improve, has a normal white count, and no fevers, would be to treat her with oral Cipro and Flagyl for a prolonged course to allow healing prior to surgery as an outpatient. If she continues to improve in terms of symptoms, this would be my preferred option to avoid the issues with home IV antibiotics and risk of the PICC line.  3.  Thank you for the consult. I will be glad to follow with you    ____________________________ Stann Mainland. Sampson Goon, MD dpf:mw D: 07/01/2014 09:38:38 ET T: 07/01/2014 11:04:42 ET JOB#: [id number]  cc: Stann Mainland. Sampson Goon, MD, <Dictator> Arby Dahir Sampson Goon MD ELECTRONICALLY SIGNED 07/04/2014 21:28

## 2014-10-31 NOTE — Consult Note (Signed)
PATIENT NAME:  Martha Chung, Martha Chung MR#:  [id number] DATE OF BIRTH:  Apr 16, 1941  DATE OF CONSULTATION:  07/10/2014  REFERRING PHYSICIAN:     Loraine Leriche A. Egbert Garibaldi, MD CONSULTING PHYSICIAN:  Duane Lope. Judithann Sheen, MD  REASON FOR CONSULTATION: Postoperative medical management and vent management.   HISTORY OF PRESENT ILLNESS: The patient is a 74 year old female admitted 2 to 3 weeks ago with a diverticular abscess requiring colectomy and colostomy. She was taken back to the OR today for colostomy revision. In the Postanesthesia Care Unit, the patient was unable to wean from the ventilator. She is now admitted to the Intensive Care Unit on the ventilator for further care and evaluation.   PAST MEDICAL HISTORY:  1.  Diverticular abscess, status post colectomy with colostomy.  2.  Status post colostomy revision.  3.  Obesity.  4.  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  5.  Recent infection with ESBL.  6.  Depression.  7.  Hypokalemia.  8.  Chronic diastolic congestive heart failure.   MEDICATIONS:  1.  Dilaudid 1 mg IV q. 4 hours p.r.n.  2.  Lisinopril 2.5 mg p.o. daily.  3.  Remeron 30 mg p.o. at bedtime.  4.  Zofran 4 mg IV q. 4 hours p.r.n.  5.  Sliding scale insulin as directed.  6.  Lovenox 40 mg subcutaneous b.i.d.   ALLERGIES: IVP DYE, MACRODANTIN, MORPHINE, AND SHELLFISH.   SOCIAL HISTORY: Negative for alcohol or tobacco abuse.   FAMILY HISTORY: Negative for heart disease, diabetes, stroke.   REVIEW OF SYSTEMS: Unable to obtain due to the patient's intubation and sedation.   PHYSICAL EXAMINATION:  GENERAL: The patient is critically ill-appearing, on the ventilator.  VITAL SIGNS: Currently remarkable for a blood pressure of 150/66 with a heart rate of 110, respiratory rate of 23, saturation 93% on the vent, temperature 98.9.  HEENT: Pupils are equal. Oropharynx is clear.  NECK: Supple without JVD. No adenopathy.  LUNGS: Reveal basilar rales.  CARDIAC: Rapid rate with a regular  rhythm. Normal S1, S2.  ABDOMEN: Soft and nontender. No organomegaly or masses were appreciated.  EXTREMITIES: Revealed 1+ edema.  NEUROLOGIC: Grossly nonfocal.   ASSESSMENT:  1.  Acute respiratory failure requiring mechanical ventilation.  2.  Postoperative from colostomy revision due to diverticular abscess.  3.  Obesity.  4.  Type 2 diabetes.  5.  Recent extended-spectrum beta-lactamase infection.  6.  History of colovaginal fistula.   PLAN: The patient will be maintained on the vent. We will give IV Lasix once now. We will rest her overnight and followup blood gas and chest x-ray in the morning. We will consider a breathing trial in the morning to see if extubation is possible. We will consult Dr. Belia Heman,  pulmonology in the morning for this reason. We will follow her sugars with Accu-Cheks q. 6 hours and use sliding scale insulin as needed as she is currently n.p.o. Followup routine labs in the morning.   Thank you for the consultation. We will continue to follow this patient with you while in the hospital.    ____________________________ Duane Lope. Judithann Sheen, MD jds:ts D: 07/10/2014 17:21:23 ET T: 07/10/2014 17:39:45 ET JOB#: [id number]  cc: Duane Lope. Judithann Sheen, MD, <Dictator> Felicita Nuncio Rodena Medin MD ELECTRONICALLY SIGNED 07/10/2014 20:46

## 2014-10-31 NOTE — Op Note (Signed)
PATIENT NAME:  Martha Chung, Martha Chung MR#:  [id number] DATE OF BIRTH:  August 31, 1940  DATE OF PROCEDURE:  07/03/2014  PREOPERATIVE DIAGNOSIS: Sigmoid diverticulitis with abscess, colovaginal fistula.   POSTOPERATIVE DIAGNOSIS: Sigmoid diverticulitis with abscess, colovaginal fistula.   PROCEDURE PERFORMED: Sigmoid colectomy with end colostomy and Hartman pouch.   ANESTHESIA: General.   ESTIMATED BLOOD LOSS: 200 mL.   COMPLICATIONS: None.   SPECIMENS: Colon.   INDICATION FOR SURGERY: [name] is a pleasant 74 year old who has persistent abdominal pain and now has begun having stool out of her vagina with a recent diagnosis of diverticulitis with abscess. She was brought to the operating room suite for definitive treatment of colovaginal fistula and diverticulitis with abscess.   DETAILS OF PROCEDURE: Informed consent was obtained. [name] was brought to the operating room suite. She was induced. Endotracheal tube was placed, general anesthesia was administered. Her abdomen was prepped and draped in standard surgical fashion. A timeout was then performed, correctly identifying the patient name, operative site, and procedure to be performed. A midline incision was made and was deepened down to the fascia. The fascia was incised. The peritoneum was entered. The left colon was mobilized up to the splenic flexure. There was a large area of inflammation with purulence down in the pelvis. This was freed up. I was not able to find a specific tract, but did find a lot of abscess cavity. Was not able to close her vaginal opening or identify it. When the sigmoid colon was mobilized, the colon was transected proximally to the inflamed area with a GIA 75 and distally, the contour stapler. Harmonic scalpel was used to transect the sigmoid mesocolon. The specimen was sent off. Next, the abdomen was irrigated with large amounts of saline. The sigmoid stump was tagged with 3-0 Prolene. The ostomy was then brought out  through her lower quadrant ostomy site with some tension that appeared to be viable. Next, the drapes were changed and the setup was changed and the wound was closed with a looped #1 PDS, ran from both directions. The wound was then stapled and there were Penrose drain sutured into the incision to allow it to drain. Next, a dressing was placed on the midline wound and her colostomy was then matured with 4-0 Monocryl sutures. The ostomy appliance was placed on the wound. The patient was then awoken, extubated, and brought to the postanesthesia care unit. There were no immediate complications. Needle, sponge, and instrument counts were correct at the end of the procedure.    ____________________________ Si Raider. Raford Brissett, MD cal:mw D: 07/09/2014 13:01:51 ET T: 07/09/2014 13:44:20 ET JOB#: [id number]  cc: Cristal Deer A. Ida Uppal, MD, <Dictator> Jarvis Newcomer MD ELECTRONICALLY SIGNED 07/13/2014 20:17

## 2014-10-31 NOTE — Discharge Summary (Signed)
PATIENT NAME:  Martha Chung, Martha Chung MR#:  [id number] DATE OF BIRTH:  1940-08-28  DATE OF ADMISSION:  06/20/2014 DATE OF DISCHARGE:  07/20/2014   PRINCIPLE DIAGNOSIS: Perforated sigmoid diverticulitis with phlegmon.   OTHER DIAGNOSES:  1.  Gout. 2.  Hypothyroidism. 3.  Hypertension. 4.  Status post cholecystectomy.  5.  Status post right kidney surgery. 6.  Status post total hysterectomy. 7.  Status post tonsillectomy. 8.  Necrotic postoperative ostomy.   PRINCIPAL PROCEDURES PERFORMED DURING THIS ADMISSION: On 07/03/2014 Hartmann's procedure, 07/10/2014 laparotomy with an ostomy revision and wound VAC placement.   HOSPITAL COURSE: The patient was admitted to the hospital and given IV antibiotics and initially n.p.o. and clear liquid diet, but she never progressed and therefore was taken to the Operating Room where she underwent the above-mentioned procedure. Ultimately, she developed a superficial wound dehiscence, and her colostomy was necrotic and therefore she required the second above-mentioned procedure and postoperative from that she has done well and initially her nutrition was cared for with total parenteral nutrition, but this was converted to a diet which she was tolerating and her ostomy was functioning and she was not requiring any analgesics for pain or any other controlled substances.  At the time of discharge to a skilled nursing facility, her medications were as follows: Metoprolol 50 mg b.i.d., levothyroxine 75 mcg daily, allopurinol 300 mg daily, Pepcid 20 mg b.i.d., vitamin A and D topical ointment (zinc oxide plus nystatin) b.i.d., Lantus insulin 13 units subcutaneous daily, Remeron 15 mg at bedtime.  She was scheduled to return to see Dr. Juliann Pulse in a week or 2 in follow-up and to call the office in the interim for any surgical problems.      ____________________________ Claude Manges, MD wfm:DT D: 07/20/2014 16:33:42 ET T: 07/20/2014 17:14:28  ET JOB#: [id number]  cc: Claude Manges, MD, <Dictator> Claude Manges MD ELECTRONICALLY SIGNED 07/23/2014 13:53

## 2014-10-31 NOTE — Op Note (Signed)
PATIENT NAME:  Martha Chung, Martha Chung MR#:  [id number] DATE OF BIRTH:  Oct 06, 1940  DATE OF PROCEDURE:  07/10/2014  PREOPERATIVE DIAGNOSIS:  Retracted necrotic and detached end colostomy.   POSTOPERATIVE DIAGNOSES:  Retracted necrotic and detached end colostomy with ischemic colon.   PROCEDURES PERFORMED: 1. Reopen recent laparotomy.  2. Partial colectomy.  3. Mobilization of hepatic flexure.  4. Construction of new end colostomy in the right upper quadrant.  5. Initial placement of wound VAC assisted closure device.   SURGEON:  Atreus Hasz A. Egbert Garibaldi, MD   ASSISTANT:  Scrub tech  ANESTHESIA:  General endotracheal.   FINDINGS:  As described above.   ESTIMATED BLOOD LOSS:  100 mL.   DRAINS:  None.   LAP and needle count correct x2.    DESCRIPTION OF PROCEDURE:  With informed consent obtained from the patient, she was brought to the operating room and positioned supine. General endotracheal anesthesia was induced. Percutaneous monitoring lines and an A line was placed by anesthesia. The patient's abdomen was widely prepped and draped, utilizing chlorhexidine solution. A timeout was observed.   Existing staples were removed. The abdomen was entered carefully and division of PDS sutures allowed entrance into the abdomen. In the left mid abdomen was the ostomy. Gentle dissection revealed adhesions of the omentum to the small bowel, which were taken down with both sharp and finger fracture technique with no evidence of serosal injury. The colostomy was encircled with blunt technique and, in doing so, it was obvious at this point that the colostomy was ischemic, necrotic, all the way down to the abdominal fascia. A small amount of stool was spilled at this point and then immediately aspirated.   The omentum was then taken off the transverse colon after the end of the bowel was stapled with a GIA 75 stapler. Remaining attachments of omentum to the mesentery of the splenic flexure were taken down with cautery  and LigaSure apparatus. The portion of the ostomy that had been brought up was unusable. In addition, the distal transverse colon and what appeared to be the splenic flexure was markedly contracted and foreshortened due to an inflammatory postoperative process in the left upper quadrant. I elected to transect the mid transverse colon, preserving the middle colic artery in the process and resecting approximately 8 inches of unusable colon, utilizing the LigaSure. The specimen was handed off the field.   The hepatic flexure was then delivered by dividing lateral attachments utilizing electrocautery and LigaSure apparatus.  Omentum was reflected off the mesocolon in avascular plane as well.   The decision at this point was made to place a new ostomy site in the right upper quadrant. Skin incision was fashioned. A cruciate incision was made in the anterior and posterior fascia with division of a few muscle fibers, but the rectus muscle was split bluntly. The colon was somewhat edematous; however, removing some epiploic fat and the division of a few branches of the mesentery allowed the colon to be brought up through the separate ostomy incision. Two stitches were then placed of 3-0 silk through the peritoneum and epiploic fat on the abdominal side and several stitches on the anterior fascia, connecting the fascia to the seromuscular layer of the bowel. The ostomy lay without any undue tension or twisting. At this point, the abdomen was copiously irrigated with several liters of warm normal saline and with lap count correct x2, the fascia was reapproximated utilizing looped running #1 PDS suture. The ostomy site was closed with interrupted  simple and figure-of-eight #1 Vicryl sutures as well. Subcutaneous tissues were irrigated. Skin staples used to reapproximate the skin edges. The ostomy site was left open. At this point, the ostomy was matured, utilizing 3-0 chromic suture. The ostomy was viable.   A wound VAC was  applied in the following manner with Ioban covering the entire abdomen, opening the Ioban over the incision in the left lower quadrant ostomy site. Adaptic was then placed over the incision. Black foam was placed into the ostomy site as well as over the abdominal incision and bridged. Track pad was applied. Negative pressure was obtained.   Ostomy appliance was then placed by cutting a hole in the Kimberly and obtaining a seal. At this point, the patient was taken directly to the intensive care unit on the ventilatory circuit by [facility] in stable condition.    ____________________________ Redge Gainer Egbert Garibaldi, MD mab:ap D: 07/11/2014 08:52:00 ET T: 07/11/2014 09:10:59 ET JOB#: [id number]  cc: Loraine Leriche A. Egbert Garibaldi, MD, <Dictator> Raynald Kemp MD ELECTRONICALLY SIGNED 07/11/2014 17:55

## 2014-10-31 NOTE — Consult Note (Signed)
Chief Complaint:  Subjective/Chief Complaint Pt s/p colostomy revision now sedated on vent. Consulted for medical and vent management.   VITAL SIGNS/ANCILLARY NOTES: **Vital Signs.:   09-Jan-16 00:07  Temperature Temperature (F) 98.5  Temperature Source oral  Pulse Pulse 94  Systolic BP Systolic BP 062  Diastolic BP (mmHg) Diastolic BP (mmHg) 67  Pulse Ox % Pulse Ox % 90  Oxygen Delivery 2L    07:29  Temperature Temperature (F) 98.6  Temperature Source oral  Pulse Pulse 78  Systolic BP Systolic BP 694  Diastolic BP (mmHg) Diastolic BP (mmHg) 68  Pulse Ox % Pulse Ox % 90  Oxygen Delivery 2L    16:43  Temperature Temperature (F) 98.9  Temperature Source oral  Pulse Pulse 854  Systolic BP Systolic BP 627  Diastolic BP (mmHg) Diastolic BP (mmHg) 99  Pulse Ox % Pulse Ox % 93  Oxygen Delivery Ventilator Assisted   Brief Assessment:  GEN critically ill appearing   Cardiac tachy   Respiratory crackles   Gastrointestinal details normal distended with hypoactive bowel sounds   EXTR positive edema   Lab Results: Routine Chem:  20-Dec-15 08:15   Glucose, Serum  222  BUN  6  Creatinine (comp) 1.05  Sodium, Serum 137  Potassium, Serum  2.7  Chloride, Serum 101  CO2, Serum 26  eGFR (Non-African American)  54 (eGFR values <52m/min/1.73 m2 may be an indication of chronic kidney disease (CKD). Calculated eGFR, using the MRDR Study equation, is useful in  patients with stable renal function. The eGFR calculation will not be reliable in acutely ill patients when serum creatinine is changing rapidly. It is not useful in patients on dialysis. The eGFR calculation may not be applicable to patients at the low and high extremes of body sizes, pregnant women, and vegetarians.)  21-Dec-15 05:24   Glucose, Serum  175  BUN 10  Creatinine (comp) 1.10  Sodium, Serum 138  Potassium, Serum  3.4  Chloride, Serum 105  CO2, Serum 24  eGFR (Non-African American)  52 (eGFR values  <612mmin/1.73 m2 may be an indication of chronic kidney disease (CKD). Calculated eGFR, using the MRDR Study equation, is useful in  patients with stable renal function. The eGFR calculation will not be reliable in acutely ill patients when serum creatinine is changing rapidly. It is not useful in patients on dialysis. The eGFR calculation may not be applicable to patients at the low and high extremes of body sizes, pregnant women, and vegetarians.)  23-Dec-15 04:18   Glucose, Serum  140  BUN 8  Creatinine (comp) 0.94  Sodium, Serum 140  Potassium, Serum 3.8  Chloride, Serum  110  CO2, Serum 24  eGFR (Non-African American) >60 (eGFR values <601min/1.73 m2 may be an indication of chronic kidney disease (CKD). Calculated eGFR, using the MRDR Study equation, is useful in  patients with stable renal function. The eGFR calculation will not be reliable in acutely ill patients when serum creatinine is changing rapidly. It is not useful in patients on dialysis. The eGFR calculation may not be applicable to patients at the low and high extremes of body sizes, pregnant women, and vegetarians.)  24-Dec-15 04:24   Glucose, Serum  125  BUN 8  Creatinine (comp) 0.88  Sodium, Serum 141  Potassium, Serum  2.9  Chloride, Serum 106  CO2, Serum 26  eGFR (Non-African American) >60 (eGFR values <38m71mn/1.73 m2 may be an indication of chronic kidney disease (CKD). Calculated eGFR, using the MRDR Study equation, is useful  in  patients with stable renal function. The eGFR calculation will not be reliable in acutely ill patients when serum creatinine is changing rapidly. It is not useful in patients on dialysis. The eGFR calculation may not be applicable to patients at the low and high extremes of body sizes, pregnant women, and vegetarians.)  25-Dec-15 04:28   Glucose, Serum  110  BUN 8  Creatinine (comp) 0.91  Sodium, Serum 145  Potassium, Serum 4.4  Chloride, Serum  108  CO2, Serum  29  eGFR (Non-African American) >60 (eGFR values <79m/min/1.73 m2 may be an indication of chronic kidney disease (CKD). Calculated eGFR, using the MRDR Study equation, is useful in  patients with stable renal function. The eGFR calculation will not be reliable in acutely ill patients when serum creatinine is changing rapidly. It is not useful in patients on dialysis. The eGFR calculation may not be applicable to patients at the low and high extremes of body sizes, pregnant women, and vegetarians.)  27-Dec-15 04:48   Glucose, Serum  104  BUN 9  Creatinine (comp) 0.94  Sodium, Serum 139  Potassium, Serum 3.9  Chloride, Serum 103  CO2, Serum 27  eGFR (Non-African American) >60 (eGFR values <670mmin/1.73 m2 may be an indication of chronic kidney disease (CKD). Calculated eGFR, using the MRDR Study equation, is useful in  patients with stable renal function. The eGFR calculation will not be reliable in acutely ill patients when serum creatinine is changing rapidly. It is not useful in patients on dialysis. The eGFR calculation may not be applicable to patients at the low and high extremes of body sizes, pregnant women, and vegetarians.)  01-Jan-16 05:22   Creatinine (comp) 0.91  eGFR (Non-African American) >60 (eGFR values <6090min/1.73 m2 may be an indication of chronic kidney disease (CKD). Calculated eGFR, using the MRDR Study equation, is useful in  patients with stable renal function. The eGFR calculation will not be reliable in acutely ill patients when serum creatinine is changing rapidly. It is not useful in patients on dialysis. The eGFR calculation may not be applicable to patients at the low and high extremes of body sizes, pregnant women, and vegetarians.)  03-Jan-16 08:25   Glucose, Serum  153  BUN 17  Creatinine (comp) 0.86  Sodium, Serum 138  Potassium, Serum 4.9  Chloride, Serum 105  CO2, Serum 28  eGFR (Non-African American) >60 (eGFR values <58m47mn/1.73  m2 may be an indication of chronic kidney disease (CKD). Calculated eGFR, using the MRDR Study equation, is useful in  patients with stable renal function. The eGFR calculation will not be reliable in acutely ill patients when serum creatinine is changing rapidly. It is not useful in patients on dialysis. The eGFR calculation may not be applicable to patients at the low and high extremes of body sizes, pregnant women, and vegetarians.)  04-Jan-16 08:27   Glucose, Serum  105  BUN 13  Creatinine (comp) 0.75  Sodium, Serum 138  Potassium, Serum 4.3  Chloride, Serum 101  CO2, Serum 32  eGFR (Non-African American) >60 (eGFR values <58mL53m/1.73 m2 may be an indication of chronic kidney disease (CKD). Calculated eGFR, using the MRDR Study equation, is useful in  patients with stable renal function. The eGFR calculation will not be reliable in acutely ill patients when serum creatinine is changing rapidly. It is not useful in patients on dialysis. The eGFR calculation may not be applicable to patients at the low and high extremes of body sizes, pregnant women, and vegetarians.)  05-Jan-16  04:56   Glucose, Serum  131  BUN 13  Creatinine (comp) 0.93  Sodium, Serum 139  Potassium, Serum 4.0  Chloride, Serum 98  CO2, Serum  35  eGFR (Non-African American) >60 (eGFR values <50m/min/1.73 m2 may be an indication of chronic kidney disease (CKD). Calculated eGFR, using the MRDR Study equation, is useful in  patients with stable renal function. The eGFR calculation will not be reliable in acutely ill patients when serum creatinine is changing rapidly. It is not useful in patients on dialysis. The eGFR calculation may not be applicable to patients at the low and high extremes of body sizes, pregnant women, and vegetarians.)  06-Jan-16 06:42   Glucose, Serum  148  BUN 15  Creatinine (comp) 0.74  Sodium, Serum 142  Potassium, Serum 3.5  Chloride, Serum 103  CO2, Serum 32  eGFR  (Non-African American) >60 (eGFR values <621mmin/1.73 m2 may be an indication of chronic kidney disease (CKD). Calculated eGFR, using the MRDR Study equation, is useful in  patients with stable renal function. The eGFR calculation will not be reliable in acutely ill patients when serum creatinine is changing rapidly. It is not useful in patients on dialysis. The eGFR calculation may not be applicable to patients at the low and high extremes of body sizes, pregnant women, and vegetarians.)  07-Jan-16 06:07   Glucose, Serum  185  BUN 16  Creatinine (comp) 0.96  Sodium, Serum 139  Potassium, Serum 3.6  Chloride, Serum 102  CO2, Serum 31  eGFR (Non-African American) >60 (eGFR values <6033min/1.73 m2 may be an indication of chronic kidney disease (CKD). Calculated eGFR, using the MRDR Study equation, is useful in  patients with stable renal function. The eGFR calculation will not be reliable in acutely ill patients when serum creatinine is changing rapidly. It is not useful in patients on dialysis. The eGFR calculation may not be applicable to patients at the low and high extremes of body sizes, pregnant women, and vegetarians.)  08-Jan-16 03:50   Glucose, Serum  192  BUN 18  Creatinine (comp) 0.75  Sodium, Serum 142  Potassium, Serum 3.8  Chloride, Serum 106  CO2, Serum 31  eGFR (Non-African American) >60 (eGFR values <45m40mn/1.73 m2 may be an indication of chronic kidney disease (CKD). Calculated eGFR, using the MRDR Study equation, is useful in  patients with stable renal function. The eGFR calculation will not be reliable in acutely ill patients when serum creatinine is changing rapidly. It is not useful in patients on dialysis. The eGFR calculation may not be applicable to patients at the low and high extremes of body sizes, pregnant women, and vegetarians.)  09-Jan-16 04:32   Glucose, Serum  223  BUN  19  Creatinine (comp) 0.80  Sodium, Serum 138  Potassium, Serum  3.9  Chloride, Serum 103  CO2, Serum 29  eGFR (Non-African American) >60 (eGFR values <45mL82m/1.73 m2 may be an indication of chronic kidney disease (CKD). Calculated eGFR, using the MRDR Study equation, is useful in  patients with stable renal function. The eGFR calculation will not be reliable in acutely ill patients when serum creatinine is changing rapidly. It is not useful in patients on dialysis. The eGFR calculation may not be applicable to patients at the low and high extremes of body sizes, pregnant women, and vegetarians.)  Routine Hem:  20-Dec-15 08:15   WBC (CBC)  21.2  Hemoglobin (CBC)  11.8  Platelet Count (CBC) 281 (Result(s) reported on 20 Jun 2014 at 08:41AM.)  21-Dec-15 05:24  WBC (CBC)  18.1  Hemoglobin (CBC)  10.8  Platelet Count (CBC) 236  23-Dec-15 04:18   WBC (CBC) 8.7  Hemoglobin (CBC)  9.7  Platelet Count (CBC) 212  24-Dec-15 04:24   WBC (CBC) 7.2  Hemoglobin (CBC)  10.8  Platelet Count (CBC) 231  25-Dec-15 04:28   WBC (CBC) 7.6  Hemoglobin (CBC)  11.5  Platelet Count (CBC) 275  27-Dec-15 04:48   WBC (CBC)  11.6  Hemoglobin (CBC)  11.6  Platelet Count (CBC) 309    10:11   WBC (CBC) 10.5  Hemoglobin (CBC)  11.2  Platelet Count (CBC) 308  02-Jan-16 05:00   Platelet Count (CBC) 273  03-Jan-16 08:25   WBC (CBC)  16.9  Hemoglobin (CBC)  9.6  Platelet Count (CBC) 263  04-Jan-16 08:27   WBC (CBC)  17.6  Hemoglobin (CBC)  9.0  Platelet Count (CBC) 251  05-Jan-16 04:56   WBC (CBC)  14.7 (Result(s) reported on 06 Jul 2014 at 05:43AM.)  Hemoglobin (CBC)  9.7 (Result(s) reported on 06 Jul 2014 at 05:43AM.)  Platelet Count (CBC) 272 (Result(s) reported on 06 Jul 2014 at 05:43AM.)  09-Jan-16 04:32   WBC (CBC)  11.9  Hemoglobin (CBC)  9.0  Platelet Count (CBC) 291   Assessment/Plan:  Assessment/Plan:  Plan IV Lasix x 1 tonight. Rest on vent overnight. ABG and CXR in AM. Consult Dr. Mortimer Fries in AM. Follow sugars. NPO for now.   Electronic  Signatures: Idelle Crouch (MD)  (Signed 09-Jan-16 17:15)  Authored: Chief Complaint, VITAL SIGNS/ANCILLARY NOTES, Brief Assessment, Lab Results, Assessment/Plan   Last Updated: 09-Jan-16 17:15 by Idelle Crouch (MD)

## 2014-11-23 ENCOUNTER — Encounter: Payer: Self-pay | Admitting: *Deleted

## 2014-11-23 NOTE — Anesthesia Preprocedure Evaluation (Addendum)
Anesthesia Evaluation  Patient identified by MRN, date of birth, ID band Patient awake    Reviewed: Allergy & Precautions, NPO status , Patient's Chart, lab work & pertinent test results  Airway Mallampati: II  TM Distance: >3 FB Neck ROM: Full    Dental   Pulmonary COPDformer smoker,    Pulmonary exam normal       Cardiovascular hypertension, +CHF Normal cardiovascular exam Per report, had "heart failure" briefly after redux several years ago.  No issues since.  Occurred while living in [place]- no echo on file but per report, active with no symptoms, reaching 4 mets of functional capacity.   Neuro/Psych    GI/Hepatic GERD-  ,  Endo/Other  Hypothyroidism   Renal/GU      Musculoskeletal  (+) Arthritis -,   Abdominal   Peds  Hematology   Anesthesia Other Findings   Reproductive/Obstetrics                           Anesthesia Physical Anesthesia Plan  ASA: III  Anesthesia Plan: MAC   Post-op Pain Management:    Induction: Intravenous  Airway Management Planned: Nasal Cannula  Additional Equipment:   Intra-op Plan:   Post-operative Plan:   Informed Consent: I have reviewed the patients History and Physical, chart, labs and discussed the procedure including the risks, benefits and alternatives for the proposed anesthesia with the patient or authorized representative who has indicated his/her understanding and acceptance.     Plan Discussed with: CRNA  Anesthesia Plan Comments:         Anesthesia Quick Evaluation

## 2014-11-24 NOTE — Discharge Instructions (Signed)

## 2014-11-25 ENCOUNTER — Encounter
Admission: RE | Disposition: A | Discharge status: Home / Self Care | Payer: Self-pay | Source: Ambulatory Visit | Attending: Gastroenterology

## 2014-11-25 ENCOUNTER — Ambulatory Visit
Admission: RE | Admit: 2014-11-25 | Discharge: 2014-11-25 | Disposition: A | Discharge status: Home / Self Care | Payer: Medicare HMO | Source: Ambulatory Visit | Attending: Gastroenterology | Admitting: Gastroenterology

## 2014-11-25 ENCOUNTER — Other Ambulatory Visit: Payer: Self-pay | Admitting: Gastroenterology

## 2014-11-25 ENCOUNTER — Ambulatory Visit: Payer: Medicare HMO | Admitting: Anesthesiology

## 2014-11-25 DIAGNOSIS — K5730 Diverticulosis of large intestine without perforation or abscess without bleeding: Secondary | ICD-10-CM | POA: Insufficient documentation

## 2014-11-25 DIAGNOSIS — M1990 Unspecified osteoarthritis, unspecified site: Secondary | ICD-10-CM | POA: Insufficient documentation

## 2014-11-25 DIAGNOSIS — J449 Chronic obstructive pulmonary disease, unspecified: Secondary | ICD-10-CM | POA: Insufficient documentation

## 2014-11-25 DIAGNOSIS — I509 Heart failure, unspecified: Secondary | ICD-10-CM | POA: Insufficient documentation

## 2014-11-25 DIAGNOSIS — I10 Essential (primary) hypertension: Secondary | ICD-10-CM | POA: Diagnosis not present

## 2014-11-25 DIAGNOSIS — K635 Polyp of colon: Secondary | ICD-10-CM | POA: Diagnosis not present

## 2014-11-25 DIAGNOSIS — Z87891 Personal history of nicotine dependence: Secondary | ICD-10-CM | POA: Diagnosis not present

## 2014-11-25 DIAGNOSIS — E039 Hypothyroidism, unspecified: Secondary | ICD-10-CM | POA: Diagnosis not present

## 2014-11-25 DIAGNOSIS — K5792 Diverticulitis of intestine, part unspecified, without perforation or abscess without bleeding: Secondary | ICD-10-CM | POA: Diagnosis present

## 2014-11-25 DIAGNOSIS — Z791 Long term (current) use of non-steroidal anti-inflammatories (NSAID): Secondary | ICD-10-CM | POA: Diagnosis not present

## 2014-11-25 DIAGNOSIS — Z79899 Other long term (current) drug therapy: Secondary | ICD-10-CM | POA: Diagnosis not present

## 2014-11-25 DIAGNOSIS — K219 Gastro-esophageal reflux disease without esophagitis: Secondary | ICD-10-CM | POA: Diagnosis not present

## 2014-11-25 DIAGNOSIS — M109 Gout, unspecified: Secondary | ICD-10-CM | POA: Diagnosis not present

## 2014-11-25 HISTORY — DX: Other complications of anesthesia, initial encounter: T88.59XA

## 2014-11-25 HISTORY — DX: Adverse effect of unspecified anesthetic, initial encounter: T41.45XA

## 2014-11-25 HISTORY — DX: Gastro-esophageal reflux disease without esophagitis: K21.9

## 2014-11-25 HISTORY — DX: Essential (primary) hypertension: I10

## 2014-11-25 HISTORY — DX: Hypothyroidism, unspecified: E03.9

## 2014-11-25 HISTORY — DX: Heart failure, unspecified: I50.9

## 2014-11-25 HISTORY — DX: Complete loss of teeth, unspecified cause, unspecified class: Z97.2

## 2014-11-25 HISTORY — DX: Complete loss of teeth, unspecified cause, unspecified class: K08.109

## 2014-11-25 HISTORY — DX: Other shoulder lesions, right shoulder: M75.81

## 2014-11-25 HISTORY — DX: Presence of spectacles and contact lenses: Z97.3

## 2014-11-25 HISTORY — DX: Nonrheumatic mitral (valve) insufficiency: I34.0

## 2014-11-25 HISTORY — PX: POLYPECTOMY: SHX149

## 2014-11-25 HISTORY — DX: Gout, unspecified: M10.9

## 2014-11-25 HISTORY — DX: Unspecified osteoarthritis, unspecified site: M19.90

## 2014-11-25 HISTORY — DX: Diverticulitis of intestine, part unspecified, without perforation or abscess without bleeding: K57.92

## 2014-11-25 HISTORY — DX: Chronic obstructive pulmonary disease, unspecified: J44.9

## 2014-11-25 HISTORY — PX: COLONOSCOPY: SHX5424

## 2014-11-25 SURGERY — COLONOSCOPY
Anesthesia: Monitor Anesthesia Care | Wound class: Contaminated

## 2014-11-25 MED ORDER — LIDOCAINE HCL (CARDIAC) 20 MG/ML IV SOLN
INTRAVENOUS | Status: DC | PRN
  Administered 2014-11-25: 30 mg via INTRAVENOUS

## 2014-11-25 MED ORDER — PROPOFOL 10 MG/ML IV BOLUS
INTRAVENOUS | Status: DC | PRN
  Administered 2014-11-25: 30 mg via INTRAVENOUS
  Administered 2014-11-25: 70 mg via INTRAVENOUS
  Administered 2014-11-25: 100 mg via INTRAVENOUS
  Administered 2014-11-25: 80 mg via INTRAVENOUS
  Administered 2014-11-25: 20 mg via INTRAVENOUS

## 2014-11-25 MED ORDER — ACETAMINOPHEN 325 MG PO TABS: 325.0000 mg | ORAL_TABLET | ORAL | Status: DC | PRN

## 2014-11-25 MED ORDER — LACTATED RINGERS IV SOLN
INTRAVENOUS | Status: DC
  Administered 2014-11-25: 09:00:00 via INTRAVENOUS
  Administered 2014-11-25: 10 mL/h via INTRAVENOUS

## 2014-11-25 MED ORDER — ACETAMINOPHEN 160 MG/5ML PO SOLN: 325.0000 mg | ORAL | Status: DC | PRN

## 2014-11-25 MED ORDER — PROPOFOL 10 MG/ML IV BOLUS: INTRAVENOUS | Status: DC | PRN

## 2014-11-25 MED ORDER — ONDANSETRON HCL 4 MG/2ML IJ SOLN: 4.0000 mg | Freq: Once | INTRAMUSCULAR | Status: DC | PRN

## 2014-11-25 SURGICAL SUPPLY — 27 items
CANISTER SUCT 1200ML W/VALVE (MISCELLANEOUS) ×3 IMPLANT
FCP ESCP3.2XJMB 240X2.8X (MISCELLANEOUS)
FORCEPS BIOP RAD 4 LRG CAP 4 (CUTTING FORCEPS) ×3 IMPLANT
FORCEPS BIOP RJ4 240 W/NDL (MISCELLANEOUS)
FORCEPS ESCP3.2XJMB 240X2.8X (MISCELLANEOUS) IMPLANT
GOWN CVR UNV OPN BCK APRN NK (MISCELLANEOUS) ×4 IMPLANT
GOWN ISOL THUMB LOOP REG UNIV (MISCELLANEOUS) ×2
HEMOCLIP INSTINCT (CLIP) IMPLANT
INJECTOR VARIJECT VIN23 (MISCELLANEOUS) IMPLANT
KIT CO2 TUBING (TUBING) ×3 IMPLANT
KIT DEFENDO VALVE AND CONN (KITS) IMPLANT
KIT ENDO PROCEDURE OLY (KITS) ×3 IMPLANT
LIGATOR MULTIBAND 6SHOOTER MBL (MISCELLANEOUS) IMPLANT
MARKER SPOT ENDO TATTOO 5ML (MISCELLANEOUS) IMPLANT
PAD GROUND ADULT SPLIT (MISCELLANEOUS) IMPLANT
SNARE SHORT THROW 13M SML OVAL (MISCELLANEOUS) ×3 IMPLANT
SNARE SHORT THROW 30M LRG OVAL (MISCELLANEOUS) IMPLANT
SPOT EX ENDOSCOPIC TATTOO (MISCELLANEOUS)
SUCTION POLY TRAP 4CHAMBER (MISCELLANEOUS) IMPLANT
TRAP SUCTION POLY (MISCELLANEOUS) ×3 IMPLANT
TUBING CONN 6MMX3.1M (TUBING)
TUBING SUCTION CONN 0.25 STRL (TUBING) IMPLANT
UNDERPAD 30X60 958B10 (PK) (MISCELLANEOUS) IMPLANT
VALVE BIOPSY ENDO (VALVE) IMPLANT
VARIJECT INJECTOR VIN23 (MISCELLANEOUS)
WATER AUXILLARY (MISCELLANEOUS) IMPLANT
WATER STERILE IRR 500ML POUR (IV SOLUTION) ×3 IMPLANT

## 2014-11-25 NOTE — H&P (Signed)
[facility]  [address], [address] [place], [place] [address] Phone: [phone] Fax : [phone]  Primary Care Physician:  No PCP Per Patient Primary Gastroenterologist:  Dr. Servando Snare  Pre-Procedure History & Physical: HPI:  Martha Chung is a 74 y.o. female is here for an colonoscopy.   Past Medical History  Diagnosis Date  . Hypertension   . Diverticulitis   . Hypothyroidism   . Gout   . Arthritis     hands, feet, "all over"  . GERD (gastroesophageal reflux disease)   . CHF (congestive heart failure)     acute, after use of Redux  . Mitral regurgitation     after use of redux  . COPD (chronic obstructive pulmonary disease)     chronic bronchitis - no meds  . Wears contact lenses   . Full dentures     upper and lower  . Right rotator cuff tendonitis   . Complication of anesthesia     on vent after colostomy revision    Past Surgical History  Procedure Laterality Date  . Tonsillectomy    . Kidney surgery Right   . Cholecystectomy    . Colectomy with colostomy creation/hartmann procedure  07/03/14  . Laparotomy      construct new colostomy and place wound VAC  . Abdominal hysterectomy  1967    Prior to Admission medications   Medication Sig Start Date End Date Taking? Authorizing Provider  allopurinol (ZYLOPRIM) 100 MG tablet Take 100 mg by mouth daily. AM   Yes Historical Provider, MD  CALCIUM PO Take by mouth.   Yes Historical Provider, MD  clonazePAM (KLONOPIN) 0.5 MG tablet Take 0.5 mg by mouth at bedtime.   Yes Historical Provider, MD  famotidine (PEPCID) 20 MG tablet Take 20 mg by mouth 2 (two) times daily.   Yes Historical Provider, MD  furosemide (LASIX) 20 MG tablet Take 20 mg by mouth. AM   Yes Historical Provider, MD  levothyroxine (SYNTHROID, LEVOTHROID) 88 MCG tablet Take 88 mcg by mouth daily before breakfast.   Yes Historical Provider, MD  meloxicam (MOBIC) 7.5 MG tablet Take 7.5 mg by mouth 2 (two) times daily.   Yes Historical  Provider, MD  metoprolol (LOPRESSOR) 50 MG tablet Take 50 mg by mouth 2 (two) times daily.   Yes Historical Provider, MD  Multiple Vitamin (MULTIVITAMIN) capsule Take 1 capsule by mouth daily.   Yes Historical Provider, MD  POTASSIUM PO Take by mouth.   Yes Historical Provider, MD    Allergies as of 11/23/2014 - Review Complete 11/23/2014  Allergen Reaction Noted  . Ivp dye [iodinated diagnostic agents] Anaphylaxis 11/23/2014  . Morphine and related Anaphylaxis 11/23/2014  . Shellfish allergy Anaphylaxis 11/23/2014  . Macrodantin [nitrofurantoin] Nausea And Vomiting 11/23/2014  . Nubain [nalbuphine hcl] Other (See Comments) 11/23/2014    History reviewed. No pertinent family history.  History   Social History  . Marital Status: Unknown    Spouse Name: N/A  . Number of Children: N/A  . Years of Education: N/A   Occupational History  . Not on file.   Social History Main Topics  . Smoking status: Former Smoker    Quit date: 07/03/1999  . Smokeless tobacco: Not on file  . Alcohol Use: No  . Drug Use: Not on file  . Sexual Activity: Not on file   Other Topics Concern  . Not on file   Social History Narrative  . No narrative on file    Review  of Systems: See HPI, otherwise negative ROS  Physical Exam: BP 137/75 mmHg  Pulse 59  Temp(Src) 98.1 F (36.7 C) (Temporal)  Resp 16  Ht 5\' 3"  (1.6 m)  Wt 217 lb (98.431 kg)  BMI 38.45 kg/m2  SpO2 96% General:   Alert,  pleasant and cooperative in NAD Head:  Normocephalic and atraumatic. Neck:  Supple; no masses or thyromegaly. Lungs:  Clear throughout to auscultation.    Heart:  Regular rate and rhythm. Abdomen:  Soft, nontender and nondistended. Normal bowel sounds, without guarding, and without rebound.   Neurologic:  Alert and  oriented x4;  grossly normal neurologically.  Impression/Plan: Martha Chung is here for an colonoscopy to be performed for Diverticulitis  Risks, benefits, limitations, and alternatives  regarding  colonoscopy have been reviewed with the patient.  Questions have been answered.  All parties agreeable.   [facility], MD  11/25/2014, 8:38 AM

## 2014-11-25 NOTE — Op Note (Signed)
[facility] Patient Name: Martha Chung Procedure Date: 11/25/2014 9:06 AM MRN: [id number] Account #: [id number] Date of Birth: June 20, 1941 Admit Type: Outpatient Age: 74 Room: [facility] OR ROOM 01 Gender: Female Note Status: Finalized Procedure:         Colonoscopy Providers:         Midge Minium, MD Referring MD:      Teena Irani. Terance Hart, MD (Referring MD) Medicines:         Propofol per Anesthesia Complications:     No immediate complications. Procedure:         Pre-Anesthesia Assessment:                    - Prior to the procedure, a History and Physical was                     performed, and patient medications and allergies were                     reviewed. The patient's tolerance of previous anesthesia                     was also reviewed. The risks and benefits of the procedure                     and the sedation options and risks were discussed with the                     patient. All questions were answered, and informed consent                     was obtained. Prior Anticoagulants: The patient has taken                     no previous anticoagulant or antiplatelet agents. ASA                     Grade Assessment: II - A patient with mild systemic                     disease. After reviewing the risks and benefits, the                     patient was deemed in satisfactory condition to undergo                     the procedure.                    After obtaining informed consent, the colonoscope was                     passed under direct vision. Throughout the procedure, the                     patient's blood pressure, pulse, and oxygen saturations                     were monitored continuously. The Olympus Colonoscope                     PCF-160AL (S# [id number]) was introduced through the                     transverse colostomy and advanced to the the cecum,  identified by appendiceal orifice and ileocecal valve. The                      Olympus Colonoscope PCF-160AL (S# [id number]) was introduced                     through the anus and advanced to the the sigmoid colon to                     examine an anastomosis. This was the intended extent. The                     colonoscopy was performed without difficulty. The patient                     tolerated the procedure well. The quality of the bowel                     preparation was good. Findings:      The perianal and digital rectal examinations were normal.      Many small-mouthed diverticula were found in the sigmoid colon.      A 4 mm polyp was found in the ascending colon. The polyp was sessile.       The polyp was removed with a cold biopsy forceps. Resection and       retrieval were complete.      A few small-mouthed diverticula were found in the ascending colon. Impression:        - Diverticulosis in the sigmoid colon.                    - One 4 mm polyp in the ascending colon. Resected and                     retrieved.                    - Diverticulosis in the ascending colon.                    - Colostomy. Recommendation:    - Await pathology results.                    - Repeat colonoscopy in 5 years if polyp adenoma and 10                     years if hyperplastic Procedure Code(s): --- Professional ---                    [phone], Colonoscopy through stoma; with biopsy, single or                     multiple Diagnosis Code(s): --- Professional ---                    D12.2, Benign neoplasm of ascending colon                    K57.30, Diverticulosis of large intestine without                     perforation or abscess without bleeding CPT copyright 2014 American Medical Association. All rights reserved. The codes documented in this report are preliminary and upon coder review may  be revised to meet current compliance requirements. Midge Minium,  MD 11/25/2014 9:38:28 AM This report has been signed electronically. Number of Addenda:  0 Note Initiated On: 11/25/2014 9:06 AM Scope Withdrawal Time: 0 hours 12 minutes 44 seconds  Total Procedure Duration: 0 hours 17 minutes 19 seconds       [facility]

## 2014-11-25 NOTE — Transfer of Care (Signed)
Immediate Anesthesia Transfer of Care Note  Patient: Martha Chung  Procedure(s) Performed: Procedure(s): COLONOSCOPY (N/A) POLYPECTOMY INTESTINAL  Patient Location: PACU  Anesthesia Type: MAC  Level of Consciousness: awake, alert  and patient cooperative  Airway and Oxygen Therapy: Patient Spontanous Breathing and Patient connected to supplemental oxygen  Post-op Assessment: Post-op Vital signs reviewed, Patient's Cardiovascular Status Stable, Respiratory Function Stable, Patent Airway and No signs of Nausea or vomiting  Post-op Vital Signs: Reviewed and stable  Complications: No apparent anesthesia complications

## 2014-11-25 NOTE — Anesthesia Postprocedure Evaluation (Signed)
  Anesthesia Post-op Note  Patient: Martha Chung  Procedure(s) Performed: Procedure(s): COLONOSCOPY (N/A) POLYPECTOMY INTESTINAL  Anesthesia type:MAC  Patient location: PACU  Post pain: Pain level controlled  Post assessment: Post-op Vital signs reviewed, Patient's Cardiovascular Status Stable, Respiratory Function Stable, Patent Airway and No signs of Nausea or vomiting  Post vital signs: Reviewed and stable  Last Vitals:  Filed Vitals:   11/25/14 0936  BP: 98/45  Pulse: 66  Temp: 36.3 C  Resp:     Level of consciousness: awake, alert  and patient cooperative  Complications: No apparent anesthesia complications

## 2014-11-30 ENCOUNTER — Encounter: Payer: Self-pay | Admitting: Gastroenterology

## 2014-12-01 ENCOUNTER — Ambulatory Visit: Payer: Self-pay | Admitting: Surgery

## 2014-12-07 ENCOUNTER — Ambulatory Visit (INDEPENDENT_AMBULATORY_CARE_PROVIDER_SITE_OTHER): Payer: Medicare HMO | Admitting: Surgery

## 2014-12-07 ENCOUNTER — Encounter: Payer: Self-pay | Admitting: Surgery

## 2014-12-07 VITALS — BP 149/70 | HR 67 | Temp 97.9°F | Ht 63.0 in | Wt 225.0 lb

## 2014-12-07 DIAGNOSIS — K5720 Diverticulitis of large intestine with perforation and abscess without bleeding: Secondary | ICD-10-CM | POA: Diagnosis not present

## 2014-12-07 DIAGNOSIS — K5792 Diverticulitis of intestine, part unspecified, without perforation or abscess without bleeding: Secondary | ICD-10-CM | POA: Insufficient documentation

## 2014-12-07 NOTE — Patient Instructions (Signed)
Follow-up in [facility] Office the week of June 18th with Dr. Egbert Garibaldi.  Please call with any questions or concerns.

## 2014-12-07 NOTE — Progress Notes (Signed)
Subjective:     Patient ID: Martha Chung, female   DOB: [birth year], 74 y.o.   MRN: [id number]  HPI  74 year-old white female status post Hartman's procedure with revision of colostomy in January of this year (2016). She is interested in colostomy reversal. Recently, a colonoscopy was performed demonstrating no signs of malignancy. A hyperplastic polyp was noted. She had extensive diverticular changes in the remaining sigmoid colon. She would like to have surgery in late July or early August. She's having no current issues. She is getting more active at the [facility].  Review of Systems  Constitutional: Negative for chills, activity change and appetite change.  HENT: Negative.   Eyes: Negative.   Respiratory: Negative.   Gastrointestinal: Negative for nausea, vomiting, abdominal pain, constipation and abdominal distention.  Endocrine: Negative.   All other systems reviewed and are negative.      Objective:   Physical Exam  Constitutional: She is oriented to person, place, and time. She appears well-developed.  HENT:  Head: Normocephalic and atraumatic.  Eyes: Conjunctivae are normal. Pupils are equal, round, and reactive to light.  Neck: Normal range of motion. Neck supple. No thyromegaly present.  Pulmonary/Chest: Effort normal and breath sounds normal.  Abdominal:  Right sided ostomy present. No hernia present.  Neurological: She is alert and oriented to person, place, and time.  Skin: Skin is warm and dry.  Psychiatric: She has a normal mood and affect. Her behavior is normal. Judgment and thought content normal.       Assessment:     74 year-old white female with Candida diverticulitis and desire for colostomy reversal.    Plan:     We discussed in the office colostomy reversal with interest. Length of stay and recovery time to be done in early August. She is in agreement. We described wound infection ureteral injury and anastomotic dehiscence. I quoted her a 20% risk of  wound infection and a 25% risk of incisional hernia in the first year. I will see her back prior to surgery.

## 2014-12-08 ENCOUNTER — Telehealth: Payer: Self-pay

## 2014-12-08 NOTE — Telephone Encounter (Signed)
-----   Message from Midge Minium, MD sent at 12/03/2014 11:21 AM EDT ----- Let the patient know the polyp was hyperplastic and she needs a repeat colonoscopy in 10 years or sooner is she is having bleeding or other lower GI problems.

## 2014-12-08 NOTE — Telephone Encounter (Signed)
LVM for pt to return my call.

## 2014-12-16 NOTE — Telephone Encounter (Signed)
Pt notified of results. Added to recall list. 

## 2015-01-07 ENCOUNTER — Telehealth: Payer: Self-pay | Admitting: Surgery

## 2015-01-19 ENCOUNTER — Ambulatory Visit: Payer: Medicare HMO | Admitting: Surgery

## 2015-01-20 ENCOUNTER — Encounter: Payer: Self-pay | Admitting: Surgery

## 2015-01-20 ENCOUNTER — Ambulatory Visit (INDEPENDENT_AMBULATORY_CARE_PROVIDER_SITE_OTHER): Payer: Medicare HMO | Admitting: Surgery

## 2015-01-20 VITALS — BP 129/81 | HR 70 | Temp 97.8°F | Ht 63.0 in | Wt 223.0 lb

## 2015-01-20 DIAGNOSIS — K5720 Diverticulitis of large intestine with perforation and abscess without bleeding: Secondary | ICD-10-CM

## 2015-01-20 DIAGNOSIS — R1030 Lower abdominal pain, unspecified: Secondary | ICD-10-CM

## 2015-01-20 MED ORDER — BISACODYL 5 MG PO TBEC: 20.0000 mg | DELAYED_RELEASE_TABLET | Freq: Once | ORAL | Status: DC

## 2015-01-20 MED ORDER — POLYETHYLENE GLYCOL 3350 17 GM/SCOOP PO POWD: 1.0000 | Freq: Once | ORAL | Status: DC

## 2015-01-20 MED ORDER — FLEET ENEMA 7-19 GM/118ML RE ENEM: 1.0000 | ENEMA | Freq: Once | RECTAL | Status: DC

## 2015-01-20 NOTE — Patient Instructions (Addendum)
Please give your urine sample at [facility] today. We will call with results as soon as they are available.  We will arrange to have your Colostomy takedown completed on 01/31/15 at [facility]. Our office will call you with the details of this surgery and pre-op appointment.  Please call with any questions or concerns.  Please refer to your Surgery Prep Instructions. Your medicines have been sent over to the [facility] on [address].

## 2015-01-22 NOTE — Progress Notes (Signed)
Patient ID: Martha Chung, female   DOB: 10/29/1940, 74 y.o.   MRN: [id number]  Chief Complaint  Patient presents with  . Discussion    Colostomy Reversal    HPI Location, Quality, Duration, Severity, Timing, Context, Modifying Factors, Associated Signs and Symptoms.  Martha Chung is a 74 y.o. female.  who underwent a Hartmann's procedure in January of this year for perforated sigmoid diverticulitis. 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 She is now prepared for colostomy reversal.  A recent colonoscopy was unremarkable except for diverticulosis. She's had no interval issues with her ostomy.  Past Medical History  Diagnosis Date  . Hypertension   . Diverticulitis   . Hypothyroidism   . Gout   . Arthritis     hands, feet, "all over"  . GERD (gastroesophageal reflux disease)   . CHF (congestive heart failure)     acute, after use of Redux  . Mitral regurgitation     after use of redux  . COPD (chronic obstructive pulmonary disease)     chronic bronchitis - no meds  . Wears contact lenses   . Full dentures     upper and lower  . Right rotator cuff tendonitis   . Complication of anesthesia     on vent after colostomy revision  . Lichen sclerosus     Vaginal Area    Past Surgical History  Procedure Laterality Date  . Tonsillectomy    . Kidney surgery Right   . Cholecystectomy    . Colectomy with colostomy creation/hartmann procedure  07/03/14  . Laparotomy      construct new colostomy and place wound VAC  . Colonoscopy N/A 11/25/2014    Procedure: COLONOSCOPY;  Surgeon: Midge Minium, MD;  Location: [facility];  Service: Gastroenterology;  Laterality: N/A;  . Polypectomy  11/25/2014    Procedure: POLYPECTOMY INTESTINAL;  Surgeon: Midge Minium, MD;  Location: [facility];  Service: Gastroenterology;;  . Abdominal hysterectomy  1967   Partial    Family History  Problem Relation Age of Onset  . Hypertension Mother   . COPD Mother   . Heart disease Mother   . Cancer Mother     Breast  . Heart disease Father   . COPD Father   . Cancer Brother 53    Lung  . Cancer Daughter 7    Brain    Social History History  Substance Use Topics  . Smoking status: Former Smoker    Quit date: 07/03/1999  . Smokeless tobacco: Never Used  . Alcohol Use: No    Allergies  Allergen Reactions  . Ivp Dye [Iodinated Diagnostic Agents] Anaphylaxis  . Morphine And Related Anaphylaxis    Respiratory Arrest  . Shellfish Allergy Anaphylaxis  . Macrodantin [Nitrofurantoin] Nausea And Vomiting    And diarrhea  . Nubain [Nalbuphine Hcl] Other (See Comments)    BP "bottoms out"    Current Outpatient Prescriptions  Medication Sig Dispense Refill  . allopurinol (ZYLOPRIM) 100 MG tablet Take 100 mg by mouth daily. AM    . CALCIUM PO Take 600 mg by mouth 2 (two) times daily.     Marland Kitchen EPINEPHrine 0.3 mg/0.3 mL IJ SOAJ injection Inject 0.3 mg into the muscle as needed.    . famotidine (PEPCID) 20 MG tablet Take 20 mg by mouth 2 (two) times daily.    . furosemide (LASIX) 20 MG tablet Take  20 mg by mouth. AM    . levothyroxine (SYNTHROID, LEVOTHROID) 88 MCG tablet Take 75 mcg by mouth daily before breakfast.     . meloxicam (MOBIC) 7.5 MG tablet Take 7.5 mg by mouth 2 (two) times daily.    . metoprolol (LOPRESSOR) 50 MG tablet Take 50 mg by mouth 2 (two) times daily.    . Multiple Vitamin (MULTIVITAMIN) capsule Take 1 capsule by mouth daily.    Marland Kitchen POTASSIUM PO Take 20 mEq by mouth daily.     . bisacodyl (DULCOLAX) 5 MG EC tablet Take 4 tablets (20 mg total) by mouth once. Please follow surgery prep directions. 4 tablet 0  . polyethylene glycol powder (GLYCOLAX/MIRALAX) powder Take 255 g by mouth once. Please follow Surgery Prep Instructions given at appointment. 255 g 0  . sodium phosphate (FLEET) 7-19 GM/118ML ENEM Place 133 mLs (1 enema  total) rectally once. Please Follow Surgery Prep Instructions given at appointment. 1 Bottle 0   No current facility-administered medications for this visit.    Blood pressure 129/81, pulse 70, temperature 97.8 F (36.6 C), temperature source Oral, height  (1.6 m), weight 223 lb (101.152 kg).   Review of Systems  Constitutional: Negative for fever and chills.  Respiratory: Negative for cough.   Cardiovascular: Negative for chest pain.  Gastrointestinal: Negative for heartburn.  Genitourinary: Negative for dysuria, urgency and frequency.  Musculoskeletal: Negative.   Skin: Negative for rash.  Neurological: Negative.   Psychiatric/Behavioral: Negative.   All other systems reviewed and are negative.   Physical Exam  Constitutional: She is oriented to person, place, and time and well-developed, well-nourished, and in no distress. No distress.  HENT:  Head: Normocephalic.  Neck: Normal range of motion.  Cardiovascular: Normal rate and regular rhythm.   Pulmonary/Chest: Effort normal and breath sounds normal. No respiratory distress. She has no wheezes. She has no rales. She exhibits no tenderness.  Abdominal: Soft. There is no tenderness. There is no rebound and no guarding.  Right upper quadrant ostomy.  Musculoskeletal: Normal range of motion.  Neurological: She is oriented to person, place, and time.  Skin: Skin is warm and dry.  Psychiatric: Mood, memory, affect and judgment normal.     Data Reviewed Anoscopy report reviewed personally.  I have personally reviewed the patient's imaging, laboratory findings and medical records.    Assessment    Perforated sigmoid diverticulitis with desire for possible reversal.    Plan    I discussed with her in detail the procedure for reversal of ileostomy. I went over with her the risk of surgery including that of bleeding infection and anastomotic leak need for temporary loop diverting ileostomy and ureteral injury all her  questions were answered she wishes to proceed.       [name] MD, FACS 01/22/2015, 8:00 PM

## 2015-01-24 NOTE — Telephone Encounter (Signed)
Pt advised of pre op date/time and sx date. Sx: 01/31/15 with Dr Egbert Garibaldi, Dr Thelma Barge assisting.  Pre op: Office 01/25/15 @ 1:00pm.

## 2015-01-25 ENCOUNTER — Other Ambulatory Visit: Payer: Self-pay

## 2015-01-25 ENCOUNTER — Encounter
Admission: RE | Admit: 2015-01-25 | Discharge: 2015-01-25 | Disposition: A | Discharge status: Home / Self Care | Payer: Medicare HMO | Source: Ambulatory Visit | Attending: Surgery | Admitting: Surgery

## 2015-01-25 DIAGNOSIS — Z01812 Encounter for preprocedural laboratory examination: Secondary | ICD-10-CM | POA: Diagnosis present

## 2015-01-25 LAB — TYPE AND SCREEN
ABO/RH(D): A POS
ANTIBODY SCREEN: NEGATIVE

## 2015-01-25 LAB — BASIC METABOLIC PANEL
ANION GAP: 8 (ref 5–15)
BUN: 21 mg/dL — ABNORMAL HIGH (ref 6–20)
CHLORIDE: 102 mmol/L (ref 101–111)
CO2: 29 mmol/L (ref 22–32)
Calcium: 9 mg/dL (ref 8.9–10.3)
Creatinine, Ser: 1.03 mg/dL — ABNORMAL HIGH (ref 0.44–1.00)
GFR calc Af Amer: >60 mL/min (ref >60)
GFR calc non Af Amer: 52 mL/min — ABNORMAL LOW (ref >60)
Glucose, Bld: 108 mg/dL — ABNORMAL HIGH (ref 65–99)
POTASSIUM: 4.5 mmol/L (ref 3.5–5.1)
SODIUM: 139 mmol/L (ref 135–145)

## 2015-01-25 LAB — CBC
HEMATOCRIT: 39.4 % (ref 35.0–47.0)
HEMOGLOBIN: 13.2 g/dL (ref 12.0–16.0)
MCH: 30.9 pg (ref 26.0–34.0)
MCHC: 33.4 g/dL (ref 32.0–36.0)
MCV: 92.6 fL (ref 80.0–100.0)
Platelets: 172 10*3/uL (ref 150–440)
RBC: 4.25 MIL/uL (ref 3.80–5.20)
RDW: 15 % — ABNORMAL HIGH (ref 11.5–14.5)
WBC: 8.5 10*3/uL (ref 3.6–11.0)

## 2015-01-25 NOTE — Patient Instructions (Signed)
  Your procedure is scheduled on: Monday January 31, 2015. Report to Same Day Surgery. To find out your arrival time please call [phone] between 1PM - 3PM on Friday January 28, 2015.  Remember: Instructions that are not followed completely may result in serious medical risk, up to and including death, or upon the discretion of your surgeon and anesthesiologist your surgery may need to be rescheduled.    __x__ 1. Do not eat food or drink liquids after midnight. No gum chewing or hard candies.     ____ 2. No Alcohol for 24 hours before or after surgery.   ____ 3. Bring all medications with you on the day of surgery if instructed.    __x_ 4. Notify your doctor if there is any change in your medical condition     (cold, fever, infections).     Do not wear jewelry, make-up, hairpins, clips or nail polish.  Do not wear lotions, powders, or perfumes. You may wear deodorant.  Do not shave 48 hours prior to surgery. Men may shave face and neck.  Do not bring valuables to the hospital.    [facility] is not responsible for any belongings or valuables.               Contacts, dentures or bridgework may not be worn into surgery.  Leave your suitcase in the car. After surgery it may be brought to your room.  For patients admitted to the hospital, discharge time is determined by your treatment team.   Patients discharged the day of surgery will not be allowed to drive home.    Please read over the following fact sheets that you were given:   [facility] Preparing for Surgery  _x___ Take these medicines the morning of surgery with A SIP OF WATER:    1. famotidine (PEPCID)  2. levothyroxine (SYNTHROID, LEVOTHROID)  3. metoprolol (LOPRESSOR)   ____ Fleet Enema (as directed)   _x___ Use CHG Soap as directed  ____ Use inhalers on the day of surgery  ____ Stop metformin 2 days prior to surgery    ____ Take 1/2 of usual insulin dose the night before surgery and none on the morning of  surgery.   ____ Stop Coumadin/Plavix/aspirin on DOES NOT APPLY.  _x__ Stop Anti-inflammatories Mobic on today.    ____ Stop supplements until after surgery.    ____ Bring C-Pap to the hospital.

## 2015-01-25 NOTE — Pre-Procedure Instructions (Signed)
No preop orders written or in epic found for pt PAT visit.  Called Angie at Dr. Molinda Bailiff office, verbal order received to do T+S.

## 2015-01-26 ENCOUNTER — Telehealth: Payer: Self-pay

## 2015-01-26 LAB — ABO/RH: ABO/RH(D): A POS

## 2015-01-26 MED ORDER — FLEET ENEMA 7-19 GM/118ML RE ENEM: 1.0000 | ENEMA | Freq: Once | RECTAL | Status: DC

## 2015-01-26 MED ORDER — POLYETHYLENE GLYCOL 3350 17 GM/SCOOP PO POWD: 1.0000 | Freq: Once | ORAL | Status: DC

## 2015-01-26 MED ORDER — BISACODYL 5 MG PO TBEC: 20.0000 mg | DELAYED_RELEASE_TABLET | Freq: Once | ORAL | Status: DC

## 2015-01-26 NOTE — Telephone Encounter (Signed)
Called patient and explained that medications have been sent to [facility] on [address] and to call with any further problems.

## 2015-01-26 NOTE — Telephone Encounter (Signed)
Patient called in and states that [facility] does not have prescriptions for Bowel Prep.  After looking into this, it looks like the medications were sent to [facility].  [facility] and explained situation to them. They said that medications have been mailed out this morning. I told them that the medications were ordered on 7/21 with intentions that they would arrive to patient by now as surgery is scheduled for 8/1. I explained that these would be sent to [facility] on [address] as patient will not receive in time for prep to be completed. I was given a number for mail order over ride [phone] at this time.  Medications sent to [facility] at this time.

## 2015-01-28 ENCOUNTER — Other Ambulatory Visit: Payer: Self-pay | Admitting: *Deleted

## 2015-01-30 NOTE — Addendum Note (Signed)
Addended by: [name] on: 01/30/2015 07:52 PM   Modules accepted: Orders

## 2015-01-31 ENCOUNTER — Inpatient Hospital Stay: Payer: Medicare HMO | Admitting: Anesthesiology

## 2015-01-31 ENCOUNTER — Encounter
Admission: RE | Disposition: A | Discharge status: Home / Self Care | Payer: Self-pay | Source: Ambulatory Visit | Attending: Surgery

## 2015-01-31 ENCOUNTER — Inpatient Hospital Stay
Admission: RE | Admit: 2015-01-31 | Discharge: 2015-02-07 | DRG: 330 | Disposition: A | Discharge status: Home / Self Care | Payer: Medicare HMO | Source: Ambulatory Visit | Attending: Surgery | Admitting: Surgery

## 2015-01-31 ENCOUNTER — Encounter: Payer: Self-pay | Admitting: *Deleted

## 2015-01-31 DIAGNOSIS — Z79899 Other long term (current) drug therapy: Secondary | ICD-10-CM

## 2015-01-31 DIAGNOSIS — M13849 Other specified arthritis, unspecified hand: Secondary | ICD-10-CM | POA: Diagnosis present

## 2015-01-31 DIAGNOSIS — Z9889 Other specified postprocedural states: Secondary | ICD-10-CM | POA: Diagnosis not present

## 2015-01-31 DIAGNOSIS — I10 Essential (primary) hypertension: Secondary | ICD-10-CM | POA: Diagnosis present

## 2015-01-31 DIAGNOSIS — Z91041 Radiographic dye allergy status: Secondary | ICD-10-CM

## 2015-01-31 DIAGNOSIS — Z8249 Family history of ischemic heart disease and other diseases of the circulatory system: Secondary | ICD-10-CM

## 2015-01-31 DIAGNOSIS — Z888 Allergy status to other drugs, medicaments and biological substances status: Secondary | ICD-10-CM | POA: Diagnosis not present

## 2015-01-31 DIAGNOSIS — J449 Chronic obstructive pulmonary disease, unspecified: Secondary | ICD-10-CM | POA: Diagnosis present

## 2015-01-31 DIAGNOSIS — Z933 Colostomy status: Secondary | ICD-10-CM | POA: Diagnosis not present

## 2015-01-31 DIAGNOSIS — Z801 Family history of malignant neoplasm of trachea, bronchus and lung: Secondary | ICD-10-CM

## 2015-01-31 DIAGNOSIS — Z90710 Acquired absence of both cervix and uterus: Secondary | ICD-10-CM | POA: Diagnosis not present

## 2015-01-31 DIAGNOSIS — Z87891 Personal history of nicotine dependence: Secondary | ICD-10-CM

## 2015-01-31 DIAGNOSIS — K5720 Diverticulitis of large intestine with perforation and abscess without bleeding: Secondary | ICD-10-CM | POA: Diagnosis not present

## 2015-01-31 DIAGNOSIS — M1990 Unspecified osteoarthritis, unspecified site: Secondary | ICD-10-CM | POA: Diagnosis present

## 2015-01-31 DIAGNOSIS — Z825 Family history of asthma and other chronic lower respiratory diseases: Secondary | ICD-10-CM

## 2015-01-31 DIAGNOSIS — M109 Gout, unspecified: Secondary | ICD-10-CM | POA: Diagnosis present

## 2015-01-31 DIAGNOSIS — Z808 Family history of malignant neoplasm of other organs or systems: Secondary | ICD-10-CM | POA: Diagnosis not present

## 2015-01-31 DIAGNOSIS — K660 Peritoneal adhesions (postprocedural) (postinfection): Secondary | ICD-10-CM | POA: Diagnosis present

## 2015-01-31 DIAGNOSIS — E039 Hypothyroidism, unspecified: Secondary | ICD-10-CM | POA: Diagnosis present

## 2015-01-31 DIAGNOSIS — Z803 Family history of malignant neoplasm of breast: Secondary | ICD-10-CM

## 2015-01-31 DIAGNOSIS — K219 Gastro-esophageal reflux disease without esophagitis: Secondary | ICD-10-CM | POA: Diagnosis present

## 2015-01-31 DIAGNOSIS — K5732 Diverticulitis of large intestine without perforation or abscess without bleeding: Secondary | ICD-10-CM | POA: Diagnosis present

## 2015-01-31 DIAGNOSIS — R630 Anorexia: Secondary | ICD-10-CM | POA: Diagnosis present

## 2015-01-31 DIAGNOSIS — Z9049 Acquired absence of other specified parts of digestive tract: Secondary | ICD-10-CM | POA: Diagnosis present

## 2015-01-31 HISTORY — PX: COLOSTOMY TAKEDOWN: SHX5783

## 2015-01-31 LAB — CBC
HEMATOCRIT: 39.4 % (ref 35.0–47.0)
HEMOGLOBIN: 12.6 g/dL (ref 12.0–16.0)
MCH: 30.2 pg (ref 26.0–34.0)
MCHC: 32.1 g/dL (ref 32.0–36.0)
MCV: 94 fL (ref 80.0–100.0)
PLATELETS: 167 10*3/uL (ref 150–440)
RBC: 4.19 MIL/uL (ref 3.80–5.20)
RDW: 14.9 % — ABNORMAL HIGH (ref 11.5–14.5)
WBC: 15.1 10*3/uL — AB (ref 3.6–11.0)

## 2015-01-31 LAB — CREATININE, SERUM
CREATININE: 0.76 mg/dL (ref 0.44–1.00)
GFR calc non Af Amer: >60 mL/min (ref >60)

## 2015-01-31 SURGERY — CLOSURE, COLOSTOMY
Anesthesia: General | Wound class: Clean Contaminated

## 2015-01-31 MED ORDER — SODIUM CHLORIDE 0.9 % IV SOLN
1.0000 g | INTRAVENOUS | Status: AC
  Administered 2015-01-31: 1 g via INTRAVENOUS
  Filled 2015-01-31: qty 1

## 2015-01-31 MED ORDER — LEVOTHYROXINE SODIUM 75 MCG PO TABS: 75.0000 ug | ORAL_TABLET | Freq: Every day | ORAL | Status: DC

## 2015-01-31 MED ORDER — NALOXONE HCL 0.4 MG/ML IJ SOLN
0.4000 mg | INTRAMUSCULAR | Status: DC | PRN
  Filled 2015-01-31: qty 1

## 2015-01-31 MED ORDER — DIPHENHYDRAMINE HCL 50 MG/ML IJ SOLN: 12.5000 mg | Freq: Four times a day (QID) | INTRAMUSCULAR | Status: DC | PRN

## 2015-01-31 MED ORDER — 0.9 % SODIUM CHLORIDE (POUR BTL) OPTIME
TOPICAL | Status: DC | PRN
  Administered 2015-01-31: 1500 mL

## 2015-01-31 MED ORDER — LIDOCAINE HCL (CARDIAC) 20 MG/ML IV SOLN
INTRAVENOUS | Status: DC | PRN
  Administered 2015-01-31: 100 mg via INTRAVENOUS

## 2015-01-31 MED ORDER — ENOXAPARIN SODIUM 40 MG/0.4ML ~~LOC~~ SOLN
40.0000 mg | Freq: Once | SUBCUTANEOUS | Status: AC
  Administered 2015-01-31: 40 mg via SUBCUTANEOUS
  Filled 2015-01-31: qty 0.4

## 2015-01-31 MED ORDER — ALLOPURINOL 100 MG PO TABS
100.0000 mg | ORAL_TABLET | Freq: Every day | ORAL | Status: DC
  Administered 2015-01-31 – 2015-02-07 (×8): 100 mg via ORAL
  Filled 2015-01-31 (×8): qty 1

## 2015-01-31 MED ORDER — PROMETHAZINE HCL 25 MG/ML IJ SOLN: 6.2500 mg | INTRAMUSCULAR | Status: DC | PRN

## 2015-01-31 MED ORDER — FENTANYL CITRATE (PF) 100 MCG/2ML IJ SOLN
25.0000 ug | INTRAMUSCULAR | Status: DC | PRN
  Administered 2015-01-31 (×4): 25 ug via INTRAVENOUS

## 2015-01-31 MED ORDER — DEXAMETHASONE SODIUM PHOSPHATE 4 MG/ML IJ SOLN
INTRAMUSCULAR | Status: DC | PRN
  Administered 2015-01-31: 5 mg via INTRAVENOUS

## 2015-01-31 MED ORDER — PROPOFOL 10 MG/ML IV BOLUS
INTRAVENOUS | Status: DC | PRN
  Administered 2015-01-31: 120 mg via INTRAVENOUS

## 2015-01-31 MED ORDER — FAMOTIDINE 20 MG PO TABS
20.0000 mg | ORAL_TABLET | Freq: Once | ORAL | Status: DC
  Filled 2015-01-31: qty 1

## 2015-01-31 MED ORDER — MIDAZOLAM HCL 2 MG/2ML IJ SOLN
INTRAMUSCULAR | Status: DC | PRN
  Administered 2015-01-31: 1 mg via INTRAVENOUS

## 2015-01-31 MED ORDER — FENTANYL 10 MCG/ML IV SOLN
INTRAVENOUS | Status: DC
  Administered 2015-01-31: 15:00:00 via INTRAVENOUS
  Administered 2015-01-31: 60 ug via INTRAVENOUS
  Administered 2015-02-01 (×2): 90 ug via INTRAVENOUS
  Filled 2015-01-31 (×2): qty 50

## 2015-01-31 MED ORDER — LEVOTHYROXINE SODIUM 75 MCG PO TABS
75.0000 ug | ORAL_TABLET | Freq: Every day | ORAL | Status: DC
  Administered 2015-02-01 – 2015-02-07 (×7): 75 ug via ORAL
  Filled 2015-01-31 (×7): qty 1

## 2015-01-31 MED ORDER — ENOXAPARIN SODIUM 40 MG/0.4ML ~~LOC~~ SOLN
40.0000 mg | SUBCUTANEOUS | Status: DC
  Administered 2015-01-31 – 2015-02-03 (×4): 40 mg via SUBCUTANEOUS
  Filled 2015-01-31 (×4): qty 0.4

## 2015-01-31 MED ORDER — HYDROMORPHONE HCL 1 MG/ML IJ SOLN
INTRAMUSCULAR | Status: DC | PRN
Start: 2015-01-31 — End: 2015-01-31
  Administered 2015-01-31: .4 mg via INTRAVENOUS

## 2015-01-31 MED ORDER — ALVIMOPAN 12 MG PO CAPS
12.0000 mg | ORAL_CAPSULE | Freq: Two times a day (BID) | ORAL | Status: DC
  Administered 2015-02-01 – 2015-02-02 (×3): 12 mg via ORAL
  Filled 2015-01-31 (×4): qty 1

## 2015-01-31 MED ORDER — SODIUM CHLORIDE 0.9 % IJ SOLN: 9.0000 mL | INTRAMUSCULAR | Status: DC | PRN

## 2015-01-31 MED ORDER — ALVIMOPAN 12 MG PO CAPS
12.0000 mg | ORAL_CAPSULE | Freq: Once | ORAL | Status: AC
  Administered 2015-01-31: 12 mg via ORAL

## 2015-01-31 MED ORDER — DIPHENHYDRAMINE HCL 12.5 MG/5ML PO ELIX
12.5000 mg | ORAL_SOLUTION | Freq: Four times a day (QID) | ORAL | Status: DC | PRN
  Filled 2015-01-31: qty 5

## 2015-01-31 MED ORDER — FENTANYL CITRATE (PF) 100 MCG/2ML IJ SOLN
INTRAMUSCULAR | Status: AC
  Administered 2015-01-31: 25 ug via INTRAVENOUS
  Filled 2015-01-31: qty 2

## 2015-01-31 MED ORDER — PROPOFOL 10 MG/ML IV BOLUS: INTRAVENOUS | Status: DC | PRN

## 2015-01-31 MED ORDER — GLYCOPYRROLATE 0.2 MG/ML IJ SOLN
INTRAMUSCULAR | Status: DC | PRN
  Administered 2015-01-31: .8 mg via INTRAVENOUS

## 2015-01-31 MED ORDER — PROMETHAZINE HCL 25 MG PO TABS
12.5000 mg | ORAL_TABLET | Freq: Four times a day (QID) | ORAL | Status: DC | PRN
  Administered 2015-02-05 – 2015-02-06 (×2): 12.5 mg via ORAL
  Filled 2015-01-31 (×2): qty 1

## 2015-01-31 MED ORDER — METHYLENE BLUE 1 % INJ SOLN
INTRAMUSCULAR | Status: AC
  Filled 2015-01-31: qty 10

## 2015-01-31 MED ORDER — METOPROLOL TARTRATE 50 MG PO TABS
50.0000 mg | ORAL_TABLET | Freq: Two times a day (BID) | ORAL | Status: DC
  Administered 2015-02-02 – 2015-02-07 (×9): 50 mg via ORAL
  Filled 2015-01-31 (×11): qty 1

## 2015-01-31 MED ORDER — ALVIMOPAN 12 MG PO CAPS
ORAL_CAPSULE | ORAL | Status: AC
  Filled 2015-01-31: qty 1

## 2015-01-31 MED ORDER — DEXTROSE IN LACTATED RINGERS 5 % IV SOLN
INTRAVENOUS | Status: DC
  Administered 2015-01-31 (×2): via INTRAVENOUS
  Administered 2015-02-01: 1 mL via INTRAVENOUS
  Administered 2015-02-01 – 2015-02-06 (×9): via INTRAVENOUS

## 2015-01-31 MED ORDER — LACTATED RINGERS IV SOLN
INTRAVENOUS | Status: DC
  Administered 2015-01-31: 08:00:00 via INTRAVENOUS

## 2015-01-31 MED ORDER — ONDANSETRON HCL 4 MG/2ML IJ SOLN
INTRAMUSCULAR | Status: DC | PRN
  Administered 2015-01-31: 4 mg via INTRAVENOUS

## 2015-01-31 MED ORDER — PHENYLEPHRINE HCL 10 MG/ML IJ SOLN
INTRAMUSCULAR | Status: DC | PRN
Start: 2015-01-31 — End: 2015-01-31
  Administered 2015-01-31 (×8): 100 ug via INTRAVENOUS

## 2015-01-31 MED ORDER — ACETAMINOPHEN 650 MG RE SUPP: 650.0000 mg | Freq: Four times a day (QID) | RECTAL | Status: DC | PRN

## 2015-01-31 MED ORDER — FENTANYL CITRATE (PF) 100 MCG/2ML IJ SOLN
INTRAMUSCULAR | Status: DC | PRN
  Administered 2015-01-31: 100 ug via INTRAVENOUS

## 2015-01-31 MED ORDER — ROCURONIUM BROMIDE 100 MG/10ML IV SOLN
INTRAVENOUS | Status: DC | PRN
  Administered 2015-01-31 (×3): 10 mg via INTRAVENOUS
  Administered 2015-01-31: 40 mg via INTRAVENOUS

## 2015-01-31 MED ORDER — ALUM & MAG HYDROXIDE-SIMETH 200-200-20 MG/5ML PO SUSP: 30.0000 mL | Freq: Four times a day (QID) | ORAL | Status: DC | PRN

## 2015-01-31 MED ORDER — EPHEDRINE SULFATE 50 MG/ML IJ SOLN
INTRAMUSCULAR | Status: DC | PRN
  Administered 2015-01-31 (×2): 10 mg via INTRAVENOUS
  Administered 2015-01-31: 5 mg via INTRAVENOUS

## 2015-01-31 MED ORDER — ACETAMINOPHEN 325 MG PO TABS
650.0000 mg | ORAL_TABLET | Freq: Four times a day (QID) | ORAL | Status: DC | PRN
Start: 2015-01-31 — End: 2015-02-07
  Filled 2015-01-31: qty 2

## 2015-01-31 MED ORDER — NEOSTIGMINE METHYLSULFATE 10 MG/10ML IV SOLN
INTRAVENOUS | Status: DC | PRN
  Administered 2015-01-31: 4 mg via INTRAVENOUS

## 2015-01-31 MED ORDER — ONDANSETRON HCL 4 MG/2ML IJ SOLN
4.0000 mg | Freq: Four times a day (QID) | INTRAMUSCULAR | Status: DC | PRN
  Administered 2015-01-31 – 2015-02-01 (×3): 4 mg via INTRAVENOUS
  Filled 2015-01-31 (×3): qty 2

## 2015-01-31 SURGICAL SUPPLY — 61 items
CANISTER SUCT 1200ML W/VALVE (MISCELLANEOUS) ×2 IMPLANT
CANISTER SUCT 3000ML PPV (MISCELLANEOUS) ×2 IMPLANT
CATH TRAY 16F METER LATEX (MISCELLANEOUS) ×2 IMPLANT
DRAIN PENROSE 5/8X18 LTX STRL (WOUND CARE) ×2 IMPLANT
DRAPE INCISE IOBAN 66X45 STRL (DRAPES) ×2 IMPLANT
DRAPE LAP W/FLUID (DRAPES) IMPLANT
DRAPE LAPAROTOMY 100X77 ABD (DRAPES) ×2 IMPLANT
DRAPE LEGGINS SURG 28X43 STRL (DRAPES) ×2 IMPLANT
DRAPE TABLE BACK 80X90 (DRAPES) ×2 IMPLANT
DRAPE UNDER BUTTOCK W/FLU (DRAPES) ×2 IMPLANT
DRAPE UTILITY 15X26 TOWEL STRL (DRAPES) ×4 IMPLANT
DRSG OPSITE POSTOP 4X14 (GAUZE/BANDAGES/DRESSINGS) ×2 IMPLANT
DRSG OPSITE POSTOP 4X6 (GAUZE/BANDAGES/DRESSINGS) ×2 IMPLANT
ELECT BLADE 6.5 EXT (BLADE) ×2 IMPLANT
ELECT CAUTERY BLADE 6.4 (BLADE) ×2 IMPLANT
EVICEL 5ML SEALNT HUMAN B (Miscellaneous) ×2 IMPLANT
GAUZE SPONGE 4X4 12PLY STRL (GAUZE/BANDAGES/DRESSINGS) IMPLANT
GLOVE BIO SURGEON STRL SZ7.5 (GLOVE) ×20 IMPLANT
GLOVE INDICATOR 8.0 STRL GRN (GLOVE) ×10 IMPLANT
GOWN STRL REUS W/ TWL LRG LVL3 (GOWN DISPOSABLE) ×6 IMPLANT
GOWN STRL REUS W/ TWL XL LVL3 (GOWN DISPOSABLE) ×3 IMPLANT
GOWN STRL REUS W/TWL LRG LVL3 (GOWN DISPOSABLE) ×6
GOWN STRL REUS W/TWL XL LVL3 (GOWN DISPOSABLE) ×3
HANDLE YANKAUER SUCT BULB TIP (MISCELLANEOUS) IMPLANT
LABEL OR SOLS (LABEL) ×2 IMPLANT
LIGASURE 5MM LAPAROSCOPIC (INSTRUMENTS) ×2 IMPLANT
NS IRRIG 1000ML POUR BTL (IV SOLUTION) ×2 IMPLANT
PACK BASIN MAJOR ARMC (MISCELLANEOUS) ×2 IMPLANT
PACK COLON CLEAN CLOSURE (MISCELLANEOUS) ×2 IMPLANT
PAD GROUND ADULT SPLIT (MISCELLANEOUS) ×2 IMPLANT
RETAINER VISCERA MED (MISCELLANEOUS) ×2 IMPLANT
SEPRAFILM MEMBRANE 5X6 (MISCELLANEOUS) ×4 IMPLANT
SOL PREP PVP 2OZ (MISCELLANEOUS) ×2
SOLUTION PREP PVP 2OZ (MISCELLANEOUS) ×1 IMPLANT
SPONGE LAP 18X18 5 PK (GAUZE/BANDAGES/DRESSINGS) ×2 IMPLANT
STAPLER PROXIMATE 75MM BLUE (STAPLE) ×2 IMPLANT
STAPLER SKIN PROX 35W (STAPLE) ×2 IMPLANT
STRIP CLOSURE SKIN 1/2X4 (GAUZE/BANDAGES/DRESSINGS) IMPLANT
SUT CHROMIC 3 0 SH 27 (SUTURE) IMPLANT
SUT ETHILON 3-0 FS-10 30 BLK (SUTURE)
SUT ETHILON 4-0 (SUTURE)
SUT ETHILON 4-0 FS2 18XMFL BLK (SUTURE)
SUT MAXON ABS #0 GS21 30IN (SUTURE) IMPLANT
SUT NYLON 2-0 (SUTURE) IMPLANT
SUT PDS AB 0 CT1 27 (SUTURE) IMPLANT
SUT PDS AB 1 TP1 96 (SUTURE) ×4 IMPLANT
SUT SILK 2 0 SH (SUTURE) ×2 IMPLANT
SUT SILK 3 0 SH 30 (SUTURE) ×38 IMPLANT
SUT SILK 3-0 (SUTURE) ×2 IMPLANT
SUT SILK 3-0 SH-1 18XCR BRD (SUTURE)
SUT VIC AB 1 CT1 36 (SUTURE) IMPLANT
SUT VIC AB 1 CTX 27 (SUTURE) ×8 IMPLANT
SUT VIC AB 3-0 SH 27 (SUTURE) ×1
SUT VIC AB 3-0 SH 27X BRD (SUTURE) ×1 IMPLANT
SUT VIC AB 4-0 FS2 27 (SUTURE) IMPLANT
SUT VICRYL PLUS ABS 0 54 (SUTURE) ×2 IMPLANT
SUTURE EHLN 3-0 FS-10 30 BLK (SUTURE) IMPLANT
SUTURE ETHLN 4-0 FS2 18XMF BLK (SUTURE) IMPLANT
SUTURE SILK 3-0 SH-1 18XCR BRD (SUTURE) IMPLANT
SYR BULB IRRIG 60ML STRL (SYRINGE) ×2 IMPLANT
WATER STERILE IRR 1000ML POUR (IV SOLUTION) IMPLANT

## 2015-01-31 NOTE — Anesthesia Procedure Notes (Signed)
Procedure Name: Intubation Date/Time: 01/31/2015 7:40 AM Performed by: Almeta Monas Pre-anesthesia Checklist: Patient identified, Emergency Drugs available, Suction available, Patient being monitored and Timeout performed Patient Re-evaluated:Patient Re-evaluated prior to inductionOxygen Delivery Method: Circle system utilized Preoxygenation: Pre-oxygenation with 100% oxygen Intubation Type: IV induction Ventilation: Mask ventilation without difficulty and Oral airway inserted - appropriate to patient size Laryngoscope Size: Mac and 3 Grade View: Grade I Tube type: Oral Tube size: 7.0 mm Number of attempts: 1 Airway Equipment and Method: Stylet and Oral airway Placement Confirmation: ETT inserted through vocal cords under direct vision,  positive ETCO2,  CO2 detector and breath sounds checked- equal and bilateral Secured at: 19 cm Tube secured with: Tape Dental Injury: Teeth and Oropharynx as per pre-operative assessment

## 2015-01-31 NOTE — Transfer of Care (Signed)
Immediate Anesthesia Transfer of Care Note  Patient: Martha Chung  Procedure(s) Performed: Procedure(s): COLOSTOMY TAKEDOWN, partial colectomy (N/A)  Patient Location: PACU  Anesthesia Type:General  Level of Consciousness: awake, alert  and oriented  Airway & Oxygen Therapy: Patient Spontanous Breathing  Post-op Assessment: Report given to RN and Post -op Vital signs reviewed and stable  Post vital signs: Reviewed and stable  Last Vitals:  Filed Vitals:   01/31/15 1134  BP: 140/58  Pulse: 62  Temp: 36.3 C  Resp: 26    Complications: No apparent anesthesia complications

## 2015-01-31 NOTE — Progress Notes (Signed)
Attempt to contact both sons with numbers in chart unsuccessful.  Either VM full or not accepting calls.  Will try back later.

## 2015-01-31 NOTE — H&P (View-Only) (Signed)
Patient ID: Martha Chung, female   DOB: 10/29/1940, 74 y.o.   MRN: [id number]  Chief Complaint  Patient presents with  . Discussion    Colostomy Reversal    HPI Location, Quality, Duration, Severity, Timing, Context, Modifying Factors, Associated Signs and Symptoms.  Martha Chung is a 74 y.o. female.  who underwent a Hartmann's procedure in January of this year for perforated sigmoid diverticulitis. 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 She is now prepared for colostomy reversal.  A recent colonoscopy was unremarkable except for diverticulosis. She's had no interval issues with her ostomy.  Past Medical History  Diagnosis Date  . Hypertension   . Diverticulitis   . Hypothyroidism   . Gout   . Arthritis     hands, feet, "all over"  . GERD (gastroesophageal reflux disease)   . CHF (congestive heart failure)     acute, after use of Redux  . Mitral regurgitation     after use of redux  . COPD (chronic obstructive pulmonary disease)     chronic bronchitis - no meds  . Wears contact lenses   . Full dentures     upper and lower  . Right rotator cuff tendonitis   . Complication of anesthesia     on vent after colostomy revision  . Lichen sclerosus     Vaginal Area    Past Surgical History  Procedure Laterality Date  . Tonsillectomy    . Kidney surgery Right   . Cholecystectomy    . Colectomy with colostomy creation/hartmann procedure  07/03/14  . Laparotomy      construct new colostomy and place wound VAC  . Colonoscopy N/A 11/25/2014    Procedure: COLONOSCOPY;  Surgeon: Midge Minium, MD;  Location: [facility];  Service: Gastroenterology;  Laterality: N/A;  . Polypectomy  11/25/2014    Procedure: POLYPECTOMY INTESTINAL;  Surgeon: Midge Minium, MD;  Location: [facility];  Service: Gastroenterology;;  . Abdominal hysterectomy  1967   Partial    Family History  Problem Relation Age of Onset  . Hypertension Mother   . COPD Mother   . Heart disease Mother   . Cancer Mother     Breast  . Heart disease Father   . COPD Father   . Cancer Brother 53    Lung  . Cancer Daughter 7    Brain    Social History History  Substance Use Topics  . Smoking status: Former Smoker    Quit date: 07/03/1999  . Smokeless tobacco: Never Used  . Alcohol Use: No    Allergies  Allergen Reactions  . Ivp Dye [Iodinated Diagnostic Agents] Anaphylaxis  . Morphine And Related Anaphylaxis    Respiratory Arrest  . Shellfish Allergy Anaphylaxis  . Macrodantin [Nitrofurantoin] Nausea And Vomiting    And diarrhea  . Nubain [Nalbuphine Hcl] Other (See Comments)    BP "bottoms out"    Current Outpatient Prescriptions  Medication Sig Dispense Refill  . allopurinol (ZYLOPRIM) 100 MG tablet Take 100 mg by mouth daily. AM    . CALCIUM PO Take 600 mg by mouth 2 (two) times daily.     Marland Kitchen EPINEPHrine 0.3 mg/0.3 mL IJ SOAJ injection Inject 0.3 mg into the muscle as needed.    . famotidine (PEPCID) 20 MG tablet Take 20 mg by mouth 2 (two) times daily.    . furosemide (LASIX) 20 MG tablet Take  20 mg by mouth. AM    . levothyroxine (SYNTHROID, LEVOTHROID) 88 MCG tablet Take 75 mcg by mouth daily before breakfast.     . meloxicam (MOBIC) 7.5 MG tablet Take 7.5 mg by mouth 2 (two) times daily.    . metoprolol (LOPRESSOR) 50 MG tablet Take 50 mg by mouth 2 (two) times daily.    . Multiple Vitamin (MULTIVITAMIN) capsule Take 1 capsule by mouth daily.    . POTASSIUM PO Take 20 mEq by mouth daily.     . bisacodyl (DULCOLAX) 5 MG EC tablet Take 4 tablets (20 mg total) by mouth once. Please follow surgery prep directions. 4 tablet 0  . polyethylene glycol powder (GLYCOLAX/MIRALAX) powder Take 255 g by mouth once. Please follow Surgery Prep Instructions given at appointment. 255 g 0  . sodium phosphate (FLEET) 7-19 GM/118ML ENEM Place 133 mLs (1 enema  total) rectally once. Please Follow Surgery Prep Instructions given at appointment. 1 Bottle 0   No current facility-administered medications for this visit.    Blood pressure 129/81, pulse 70, temperature 97.8 F (36.6 C), temperature source Oral, height 5' 3" (1.6 m), weight 223 lb (101.152 kg).   Review of Systems  Constitutional: Negative for fever and chills.  Respiratory: Negative for cough.   Cardiovascular: Negative for chest pain.  Gastrointestinal: Negative for heartburn.  Genitourinary: Negative for dysuria, urgency and frequency.  Musculoskeletal: Negative.   Skin: Negative for rash.  Neurological: Negative.   Psychiatric/Behavioral: Negative.   All other systems reviewed and are negative.   Physical Exam  Constitutional: She is oriented to person, place, and time and well-developed, well-nourished, and in no distress. No distress.  HENT:  Head: Normocephalic.  Neck: Normal range of motion.  Cardiovascular: Normal rate and regular rhythm.   Pulmonary/Chest: Effort normal and breath sounds normal. No respiratory distress. She has no wheezes. She has no rales. She exhibits no tenderness.  Abdominal: Soft. There is no tenderness. There is no rebound and no guarding.  Right upper quadrant ostomy.  Musculoskeletal: Normal range of motion.  Neurological: She is oriented to person, place, and time.  Skin: Skin is warm and dry.  Psychiatric: Mood, memory, affect and judgment normal.     Data Reviewed Anoscopy report reviewed personally.  I have personally reviewed the patient's imaging, laboratory findings and medical records.    Assessment    Perforated sigmoid diverticulitis with desire for possible reversal.    Plan    I discussed with her in detail the procedure for reversal of ileostomy. I went over with her the risk of surgery including that of bleeding infection and anastomotic leak need for temporary loop diverting ileostomy and ureteral injury all her  questions were answered she wishes to proceed.       Lovetta Condie MD, FACS 01/22/2015, 8:00 PM      

## 2015-01-31 NOTE — Interval H&P Note (Signed)
History and Physical Interval Note:  01/31/2015 7:24 AM  Martha Chung  has presented today for surgery, with the diagnosis of LOWER ABDOMINAL PAIN  The various methods of treatment have been discussed with the patient and family. After consideration of risks, benefits and other options for treatment, the patient has consented to  Procedure(s): COLOSTOMY TAKEDOWN (N/A) as a surgical intervention .  The patient's history has been reviewed, patient examined, no change in status, stable for surgery.  I have reviewed the patient's chart and labs.  Questions were answered to the patient's satisfaction.     [name]   Date of Initial H&P: 01/22/2015  History reviewed, patient examined, no change in status, stable for surgery.

## 2015-01-31 NOTE — Anesthesia Postprocedure Evaluation (Signed)
  Anesthesia Post-op Note  Patient: Martha Chung  Procedure(s) Performed: Procedure(s): COLOSTOMY TAKEDOWN, partial colectomy (N/A)  Anesthesia type:General ETT  Patient location: PACU  Post pain: Pain level controlled  Post assessment: Post-op Vital signs reviewed, Patient's Cardiovascular Status Stable, Respiratory Function Stable, Patent Airway and No signs of Nausea or vomiting  Post vital signs: Reviewed and stable  Last Vitals:  Filed Vitals:   01/31/15 1239  BP: 101/46  Pulse: 50  Temp:   Resp: 18    Level of consciousness: awake, alert  and patient cooperative  Complications: No apparent anesthesia complications

## 2015-01-31 NOTE — Progress Notes (Signed)
Patient ID: Martha Chung, female   DOB: [birth year], 74 y.o.   MRN: [id number]   Explained operative findings Comfortable with fentanyl PCA  Urine green c/w prior methylene blue administration. Stable pos-op course thus far. Ice chips for now.

## 2015-01-31 NOTE — Op Note (Signed)
01/31/2015  11:28 AM  PATIENT:  Martha Chung  74 y.o. female  PRE-OPERATIVE DIAGNOSIS:  s/p perforated sigmoid diverticulitis  POST-OPERATIVE DIAGNOSIS:  s/p perforated sigmoid diverticulitis  PROCEDURE:  Procedure(s): COLOSTOMY TAKEDOWN, partial colectomy (N/A)  SURGEON:  Surgeon(s) and Role:    * [name], MD - Primary  ASSISTANTS: Hulda Marin, MD FACS  ANESTHESIA: general  SPECIMEN: portion of colon  EBL: 100 cc  Description of procedure:    The patient in supine position general endotracheal anesthesia was induced. She was padded and positioned dorsal lithotomy.   Foley catheter was placed.   Patient's existing colostomy was closed to the skin utilizing a #0 silk suture. The abdomen was sterilely prepped and draped with chlor prep solution and the Betadine on the perineum.   Timeout was observed.   The prior midline skin incision was reopened sharply through muscular fascial layers with electrocautery. I immediately encountered a large amount of omental adhesions as well as small bowel adhesions to the anterior abdominal wall which were taken down with sharp dissection. No enterotomies were noted. A self-retaining abdominal wall retractor was placed. The small bowel was then packed into the upper abdomen with laparotomy tapes. The distal staple line was identified. The remaining colon was very thickened. It was taken down off the left pelvic sidewall utilizing blunt technique sharp dissection and point cautery. The staple line was incised and the distal colon was opened. A stapled anastomosis was not feasible due to the thickened nature of the bowel. The patient was given methylene blue at this point in time and a clean laparotomy tape was placed into the pelvis.    An elliptical incision at the mucocutaneous junction of the right sided colostomy site was fashion with scalpel and the ostomy was then delivered into the abdominal cavity utilizing both cautery and sharp  dissection. Further dissection along the hepatic flexure liberated further omental attachments. The portion of the colon transversing the abdominal wall and fascia was amputated and submitted to pathology as specimen dividing it with a GIA-75 stapler. The intervening mesentery was sequentially clamped cut and tied utilizing #000 Vicryl suture.   A handsewn single layer end to end anastomosis was fashioned between the two sections of intestine utilizing #000 silk suture. A posterior single layer was fashioned and sequentially tied. An anterior layer was sequentially placed and then tied. 5 cc's of fibrin glue was instilled over the staple line and omentum was draped over the entire area.   With the anastomosis appeared in good order lap and needle count was correct 2 the abdomen was copiously irrigated.  The ostomy fascial defect was reapproximated utilizing figure-of-eight #0 Vicryl suture along the posterior fascial layer. The deep space of the wound was obliterated with similar #000 Vicryls suture.   Looped #1 PDS suture was used to reapproximate fascial edges from the extremes of the wound. Skin edges were closed with skin stapler with a 1 inch Penrose in the subcutaneous fat. Sterile dressings were then applied. The patient was then returned supine extubated and taken to the recovery room in stable condition.

## 2015-01-31 NOTE — Anesthesia Preprocedure Evaluation (Signed)
Anesthesia Evaluation  Patient identified by MRN, date of birth, ID band Patient awake    Reviewed: Allergy & Precautions, H&P , NPO status , Patient's Chart, lab work & pertinent test results, reviewed documented beta blocker date and time   History of Anesthesia Complications Negative for: history of anesthetic complications  Airway Mallampati: II  TM Distance: >3 FB Neck ROM: full    Dental  (+) Edentulous Upper, Edentulous Lower, Upper Dentures, Lower Dentures   Pulmonary neg shortness of breath, neg sleep apnea, COPDneg recent URI, former smoker,  breath sounds clear to auscultation  Pulmonary exam normal       Cardiovascular Exercise Tolerance: Good hypertension, +CHF - CAD, - Past MI and - CABG negative cardio ROS  Rhythm:regular Rate:Normal     Neuro/Psych negative neurological ROS  negative psych ROS   GI/Hepatic Neg liver ROS, GERD-  Controlled,  Endo/Other  neg diabetesHypothyroidism   Renal/GU negative Renal ROS  negative genitourinary   Musculoskeletal   Abdominal   Peds  Hematology negative hematology ROS (+)   Anesthesia Other Findings Past Medical History:   Hypertension                                                 Diverticulitis                                               Hypothyroidism                                               Gout                                                         Arthritis                                                      Comment:hands, feet, "all over"   GERD (gastroesophageal reflux disease)                       CHF (congestive heart failure)                                 Comment:acute, after use of Redux   Mitral regurgitation                                           Comment:after use of redux   COPD (chronic obstructive pulmonary disease)                   Comment:chronic bronchitis - no meds   Wears  contact lenses                                          Full dentures                                                  Comment:upper and lower   Right rotator cuff tendonitis                                Lichen sclerosus                                               Comment:Vaginal Area   Complication of anesthesia                                     Comment:on vent after colostomy revision   Reproductive/Obstetrics negative OB ROS                             Anesthesia Physical Anesthesia Plan  ASA: III  Anesthesia Plan: General ETT   Post-op Pain Management:    Induction:   Airway Management Planned:   Additional Equipment:   Intra-op Plan:   Post-operative Plan:   Informed Consent: I have reviewed the patients History and Physical, chart, labs and discussed the procedure including the risks, benefits and alternatives for the proposed anesthesia with the patient or authorized representative who has indicated his/her understanding and acceptance.   Dental Advisory Given  Plan Discussed with: CRNA and Anesthesiologist  Anesthesia Plan Comments:         Anesthesia Quick Evaluation

## 2015-02-01 LAB — BASIC METABOLIC PANEL
ANION GAP: 6 (ref 5–15)
BUN: 10 mg/dL (ref 6–20)
CO2: 24 mmol/L (ref 22–32)
Calcium: 8 mg/dL — ABNORMAL LOW (ref 8.9–10.3)
Chloride: 110 mmol/L (ref 101–111)
Creatinine, Ser: 0.82 mg/dL (ref 0.44–1.00)
Glucose, Bld: 150 mg/dL — ABNORMAL HIGH (ref 65–99)
Potassium: 4.1 mmol/L (ref 3.5–5.1)
Sodium: 140 mmol/L (ref 135–145)

## 2015-02-01 LAB — CBC
HCT: 34.4 % — ABNORMAL LOW (ref 35.0–47.0)
Hemoglobin: 11.2 g/dL — ABNORMAL LOW (ref 12.0–16.0)
MCH: 30.6 pg (ref 26.0–34.0)
MCHC: 32.5 g/dL (ref 32.0–36.0)
MCV: 94.1 fL (ref 80.0–100.0)
Platelets: 154 10*3/uL (ref 150–440)
RBC: 3.65 MIL/uL — ABNORMAL LOW (ref 3.80–5.20)
RDW: 15.1 % — ABNORMAL HIGH (ref 11.5–14.5)
WBC: 13.5 10*3/uL — AB (ref 3.6–11.0)

## 2015-02-01 LAB — SURGICAL PATHOLOGY

## 2015-02-01 MED ORDER — IBUPROFEN 400 MG PO TABS
400.0000 mg | ORAL_TABLET | Freq: Four times a day (QID) | ORAL | Status: DC | PRN
  Administered 2015-02-01 – 2015-02-03 (×4): 400 mg via ORAL
  Filled 2015-02-01 (×5): qty 1

## 2015-02-01 MED ORDER — FAMOTIDINE 20 MG PO TABS
20.0000 mg | ORAL_TABLET | Freq: Every day | ORAL | Status: DC
  Administered 2015-02-01 – 2015-02-07 (×7): 20 mg via ORAL
  Filled 2015-02-01 (×7): qty 1

## 2015-02-01 MED ORDER — SODIUM CHLORIDE 0.9 % IV BOLUS (SEPSIS)
1000.0000 mL | Freq: Once | INTRAVENOUS | Status: AC
  Administered 2015-02-01: 1000 mL via INTRAVENOUS

## 2015-02-01 MED ORDER — LACTATED RINGERS IV BOLUS (SEPSIS)
500.0000 mL | Freq: Once | INTRAVENOUS | Status: AC
  Administered 2015-02-01: 500 mL via INTRAVENOUS

## 2015-02-01 MED ORDER — FENTANYL 10 MCG/ML IV SOLN
INTRAVENOUS | Status: DC
  Administered 2015-02-01: 15 ug via INTRAVENOUS
  Administered 2015-02-01: 112.5 ug via INTRAVENOUS
  Administered 2015-02-01: 45 ug via INTRAVENOUS
  Administered 2015-02-01 – 2015-02-02 (×3): 15 ug via INTRAVENOUS
  Administered 2015-02-02: 0 ug via INTRAVENOUS
  Administered 2015-02-02: 7.5 ug via INTRAVENOUS
  Administered 2015-02-02: 30 ug via INTRAVENOUS
  Administered 2015-02-02: 0 ug via INTRAVENOUS
  Filled 2015-02-01 (×2): qty 50

## 2015-02-01 NOTE — Progress Notes (Signed)
Patient ID: BRONNIE FANTA, female   DOB: 1940/07/17, 74 y.o.   MRN: [id number]   Postoperative day #1.  She states that she passed some flatus this morning. She did require some IV fluids last night.  Pain is well-controlled and the fentanyl PCA was cut in half of dose last night.  Filed Vitals:   02/01/15 0843 02/01/15 1038 02/01/15 1202 02/01/15 1206  BP:  106/50 108/43   Pulse:   94   Temp:   98.6 F (37 C)   TempSrc:   Oral   Resp: 27  25 22   Height:      Weight:      SpO2: 91%  96% 93%    CBC Latest Ref Rng 02/01/2015 01/31/2015 01/25/2015  WBC 3.6 - 11.0 K/uL 13.5(H) 15.1(H) 8.5  Hemoglobin 12.0 - 16.0 g/dL 11.2(L) 12.6 13.2  Hematocrit 35.0 - 47.0 % 34.4(L) 39.4 39.4  Platelets 150 - 440 K/uL 154 167 172    BMP Latest Ref Rng 02/01/2015 01/31/2015 01/25/2015  Glucose 65 - 99 mg/dL 902(I) - 097(D)  BUN 6 - 20 mg/dL 10 - 53(G)  Creatinine 0.44 - 1.00 mg/dL 9.92 4.26 8.34(H)  Sodium 135 - 145 mmol/L 140 - 139  Potassium 3.5 - 5.1 mmol/L 4.1 - 4.5  Chloride 101 - 111 mmol/L 110 - 102  CO2 22 - 32 mmol/L 24 - 29  Calcium 8.9 - 10.3 mg/dL 8.0(L) - 9.0  I/O last 3 completed shifts: In: 4201 [I.V.:4201] Out: 1075 [Urine:1050; Blood:25] Total I/O In: 1847 [I.V.:1847] Out: 300 [Urine:300]    Physical examination demonstrates a soft abdomen. Dressings intact. Lungs are clear. She is alert and oriented 4.  Impression doing well.  Plan out of bed to chair. Await return of bowel function. Continue intravenous fluids and Foley.

## 2015-02-01 NOTE — Progress Notes (Signed)
Called Dr. Excell Seltzer @ [phone] in regards to patient request for home medication- pepcid and mobic.  Doctor put in order for pepcid 20 mg daily and the other medication is not suitable for pt at this time.  Martha Chung  02/01/2015 10:45 PM

## 2015-02-01 NOTE — Progress Notes (Signed)
Called Dr. Excell Seltzer @ [phone] in regards to patient's low blood pressure.  Doctor ordered a 500 ml bolus of lactated ringers and cut the fentanyl pca dose in half.  Arturo Morton  02/01/2015  5:50 AM

## 2015-02-02 MED ORDER — KETOROLAC TROMETHAMINE 30 MG/ML IJ SOLN
30.0000 mg | Freq: Three times a day (TID) | INTRAMUSCULAR | Status: AC
  Administered 2015-02-02 – 2015-02-03 (×3): 30 mg via INTRAVENOUS
  Filled 2015-02-02 (×3): qty 1

## 2015-02-02 MED ORDER — FUROSEMIDE 20 MG PO TABS
20.0000 mg | ORAL_TABLET | Freq: Every day | ORAL | Status: DC
  Administered 2015-02-02: 20 mg via ORAL
  Filled 2015-02-02: qty 1

## 2015-02-02 MED ORDER — FUROSEMIDE 20 MG PO TABS
10.0000 mg | ORAL_TABLET | Freq: Every day | ORAL | Status: DC
  Administered 2015-02-03: 10 mg via ORAL
  Filled 2015-02-02: qty 1

## 2015-02-02 MED ORDER — FENTANYL CITRATE (PF) 100 MCG/2ML IJ SOLN: 12.5000 ug | INTRAMUSCULAR | Status: DC | PRN

## 2015-02-02 NOTE — Progress Notes (Signed)
Patient ID: Martha Chung, female   DOB: 1940/08/01, 74 y.o.   MRN: [id number] Surgery  POD 1  S/P status post takedown colostomy with partial colectomy.  She is passing more flatus. Her pain is well-controlled she is not using her fentanyl PCA. He is out of bed to a chair.  Filed Vitals:   02/02/15 0800 02/02/15 1057 02/02/15 1200 02/02/15 1557  BP:  120/60    Pulse:      Temp:      TempSrc:      Resp: 19  26 20   Height:      Weight:      SpO2: 98%  93% 93%    PE:   there some moderate amount of drainage on the lower aspect of the honeycomb dressing. Her abdomen is soft and nontender.    Affect is normal alert and oriented 4.  Labs  CBC Latest Ref Rng 02/01/2015 01/31/2015 01/25/2015  WBC 3.6 - 11.0 K/uL 13.5(H) 15.1(H) 8.5  Hemoglobin 12.0 - 16.0 g/dL 11.2(L) 12.6 13.2  Hematocrit 35.0 - 47.0 % 34.4(L) 39.4 39.4  Platelets 150 - 440 K/uL 154 167 172   CMP Latest Ref Rng 02/01/2015 01/31/2015 01/25/2015  Glucose 65 - 99 mg/dL 381(O) - 175(Z)  BUN 6 - 20 mg/dL 10 - 02(H)  Creatinine 0.44 - 1.00 mg/dL 8.52 7.78 2.42(P)  Sodium 135 - 145 mmol/L 140 - 139  Potassium 3.5 - 5.1 mmol/L 4.1 - 4.5  Chloride 101 - 111 mmol/L 110 - 102  CO2 22 - 32 mmol/L 24 - 29  Calcium 8.9 - 10.3 mg/dL 8.0(L) - 9.0  Total Protein 6.4-8.2 g/dL - - -  Total Bilirubin 0.2-1.0 mg/dL - - -  Alkaline Phos - - - -  AST 15-37 Unit/L - - -  ALT - - - -     IMP: Stable postoperative day #2.  Plan:  Discontinue Foley in morning. Clear liquids. Continue Entereg.

## 2015-02-02 NOTE — Progress Notes (Signed)
Foley Discontinued at 1645. Urine was yellow and clear. There were no complaints of pain or burning during foley removal. Caleen Essex, RN

## 2015-02-02 NOTE — Care Management (Signed)
Spoke with patient for discharge planning. Patient was open to [facility] previously and would like to continue if needed at discharge. May benefit for PT evaluation.  Colostomy reversal. Stated that she was doing well at home prior to admission. Needs PCP as Dr Chilton Si has retired. Prefers Female provider.  Remains on NPO and complains headache nothing seems to help. Will inform nurse. Needs PCP continue to follow.

## 2015-02-02 NOTE — Progress Notes (Signed)
Asked pt if she wanted to walk tonight but refused due to pain. Lennon Alstrom N  02/02/2015  4:04 AM

## 2015-02-02 NOTE — Care Management Important Message (Signed)
Important Message  Patient Details  Name: Martha Chung MRN: [id number] Date of Birth: Nov 05, 1940   Medicare Important Message Given:  Yes-second notification given    [name] 02/02/2015, 9:46 AM

## 2015-02-03 LAB — BASIC METABOLIC PANEL
Anion gap: 7 (ref 5–15)
BUN: 7 mg/dL (ref 6–20)
CALCIUM: 7.8 mg/dL — AB (ref 8.9–10.3)
CO2: 25 mmol/L (ref 22–32)
CREATININE: 0.71 mg/dL (ref 0.44–1.00)
Chloride: 110 mmol/L (ref 101–111)
GFR calc Af Amer: >60 mL/min (ref >60)
GFR calc non Af Amer: >60 mL/min (ref >60)
GLUCOSE: 110 mg/dL — AB (ref 65–99)
Potassium: 3.4 mmol/L — ABNORMAL LOW (ref 3.5–5.1)
SODIUM: 142 mmol/L (ref 135–145)

## 2015-02-03 LAB — CBC
HEMATOCRIT: 32.4 % — AB (ref 35.0–47.0)
Hemoglobin: 10.5 g/dL — ABNORMAL LOW (ref 12.0–16.0)
MCH: 31.1 pg (ref 26.0–34.0)
MCHC: 32.6 g/dL (ref 32.0–36.0)
MCV: 95.6 fL (ref 80.0–100.0)
Platelets: 121 10*3/uL — ABNORMAL LOW (ref 150–440)
RBC: 3.39 MIL/uL — ABNORMAL LOW (ref 3.80–5.20)
RDW: 15.1 % — ABNORMAL HIGH (ref 11.5–14.5)
WBC: 9.4 10*3/uL (ref 3.6–11.0)

## 2015-02-03 MED ORDER — ENOXAPARIN SODIUM 40 MG/0.4ML ~~LOC~~ SOLN
40.0000 mg | Freq: Two times a day (BID) | SUBCUTANEOUS | Status: DC
  Administered 2015-02-04 – 2015-02-07 (×7): 40 mg via SUBCUTANEOUS
  Filled 2015-02-03 (×7): qty 0.4

## 2015-02-03 MED ORDER — FUROSEMIDE 20 MG PO TABS
20.0000 mg | ORAL_TABLET | Freq: Every day | ORAL | Status: DC
  Administered 2015-02-04 – 2015-02-06 (×3): 20 mg via ORAL
  Filled 2015-02-03 (×3): qty 1

## 2015-02-03 MED ORDER — TRAMADOL HCL 50 MG PO TABS
50.0000 mg | ORAL_TABLET | Freq: Four times a day (QID) | ORAL | Status: DC | PRN
  Administered 2015-02-03 – 2015-02-06 (×4): 50 mg via ORAL
  Filled 2015-02-03 (×4): qty 1

## 2015-02-03 NOTE — Progress Notes (Signed)
[name], no noted distress. Slept well during shift. Patient voided x2 on shift with RN. Urine, yellow-greenish. Administered IV 22 g, right hand by Tresa Endo, RN. Staff will continue to monitor and meet needs. Patient had x2 BM 02/02/15.

## 2015-02-03 NOTE — Progress Notes (Signed)
Patient ID: Martha Chung, female   DOB: 01-18-1941, 74 y.o.   MRN: [id number]   Surgery  POD 3   S/P takedown colostomy with partial colectomy.  She is feeling much better. She's had 3 bowel movements. She has passed flatus. Pain is well-controlled with oral Toradol. She is hungry.  Filed Vitals:   02/03/15 0735 02/03/15 1324 02/03/15 1356 02/03/15 1607  BP: 129/50 129/39 120/70 157/64  Pulse: 89 83  85  Temp: 98.5 F (36.9 C) 97.9 F (36.6 C)  98.1 F (36.7 C)  TempSrc: Oral Oral  Oral  Resp: 20 18  20   Height:      Weight:      SpO2: 94% 95%  96%    PE:  Her abdomen is soft and nontender  her lungs are clear  wound was inspected.  Penrose drain remains in place with moderate drainage.  Labs  CBC Latest Ref Rng 02/03/2015 02/01/2015 01/31/2015  WBC 3.6 - 11.0 K/uL 9.4 13.5(H) 15.1(H)  Hemoglobin 12.0 - 16.0 g/dL 10.5(L) 11.2(L) 12.6  Hematocrit 35.0 - 47.0 % 32.4(L) 34.4(L) 39.4  Platelets 150 - 440 K/uL 121(L) 154 167   CMP Latest Ref Rng 02/03/2015 02/01/2015 01/31/2015  Glucose 65 - 99 mg/dL 111(B) 520(E) -  BUN 6 - 20 mg/dL 7 10 -  Creatinine 0.22 - 1.00 mg/dL 3.36 1.22 4.49  Sodium 135 - 145 mmol/L 142 140 -  Potassium 3.5 - 5.1 mmol/L 3.4(L) 4.1 -  Chloride 101 - 111 mmol/L 110 110 -  CO2 22 - 32 mmol/L 25 24 -  Calcium 8.9 - 10.3 mg/dL 7.8(L) 8.0(L) -  Total Protein 6.4-8.2 g/dL - - -  Total Bilirubin 0.2-1.0 mg/dL - - -  Alkaline Phos - - - -  AST 15-37 Unit/L - - -  ALT - - - -     IMP:  Stable postoperative day #3. Doing well.  Plan advance diet. Decrease IV fluids. Increase Lasix. Anticipate discharge home Monday.

## 2015-02-04 LAB — CBC
HEMATOCRIT: 29.5 % — AB (ref 35.0–47.0)
HEMOGLOBIN: 9.6 g/dL — AB (ref 12.0–16.0)
MCH: 30.7 pg (ref 26.0–34.0)
MCHC: 32.5 g/dL (ref 32.0–36.0)
MCV: 94.3 fL (ref 80.0–100.0)
Platelets: 152 10*3/uL (ref 150–440)
RBC: 3.13 MIL/uL — ABNORMAL LOW (ref 3.80–5.20)
RDW: 14.9 % — ABNORMAL HIGH (ref 11.5–14.5)
WBC: 8.6 10*3/uL (ref 3.6–11.0)

## 2015-02-04 LAB — BASIC METABOLIC PANEL
ANION GAP: 5 (ref 5–15)
BUN: 10 mg/dL (ref 6–20)
CO2: 31 mmol/L (ref 22–32)
Calcium: 8 mg/dL — ABNORMAL LOW (ref 8.9–10.3)
Chloride: 109 mmol/L (ref 101–111)
Creatinine, Ser: 0.81 mg/dL (ref 0.44–1.00)
GFR calc Af Amer: >60 mL/min (ref >60)
GFR calc non Af Amer: >60 mL/min (ref >60)
Glucose, Bld: 121 mg/dL — ABNORMAL HIGH (ref 65–99)
Potassium: 3.4 mmol/L — ABNORMAL LOW (ref 3.5–5.1)
Sodium: 145 mmol/L (ref 135–145)

## 2015-02-04 NOTE — Evaluation (Signed)
Physical Therapy Evaluation Patient Details Name: Martha Chung MRN: [id number] DOB: [birth year] Today's Date: 02/04/2015   History of Present Illness  Pt post-op colostomy takedown with partial colestomy, pt presents with abdomen weakness  Clinical Impression  Pt reports feeling better than previous days in hospital and reports no pain with any movements/ambulation, does report 6/10 pain abdomen pain with direct touch so avoided placing gait belt close to abdomen. Pt was able to stand and ambulate for 60ft with CGA/min assist using a RW for safety purposes, pt reported min fatigue in abdomen after ambulation.  Pt was able to complete seated therex with no UE/back support, reported abdomen fatigue at the end of session (HR at 100bpm after therex, 82bpm before). PT recommends attempting stairs in next session. Pt would benefit from skilled PT in order to improve with standing balance and increasing ambulation distance with less abdomen fatigue.      Follow Up Recommendations Home health PT (Could progress to outpatient)    Equipment Recommendations  Rolling walker with 5" wheels    Recommendations for Other Services       Precautions / Restrictions Precautions Precautions: Fall Restrictions Weight Bearing Restrictions: No      Mobility  Bed Mobility                  Transfers Overall transfer level: Needs assistance Equipment used: Rolling walker (2 wheeled) Transfers: Sit to/from Stand Sit to Stand: Min guard         General transfer comment: Min assist/RW used for safety purposes. Pt didn't require verbal cuing and displayed proper technique for standin (flexed knees/trunk/proper hand placement).   Ambulation/Gait Ambulation/Gait assistance: Min assist Ambulation Distance (Feet): 75 Feet Assistive device: Rolling walker (2 wheeled) Gait Pattern/deviations: WFL(Within Functional Limits);Step-through pattern     General Gait Details: Pt was [facility] with min  assist/RW for safety purposes.  Initially, pt ambulated outside of RW, but was easily corrected with verbal cuing.   Stairs            Wheelchair Mobility    Modified Rankin (Stroke Patients Only)       Balance Overall balance assessment: Needs assistance Sitting-balance support: No upper extremity supported;Feet unsupported Sitting balance-Leahy Scale: Good Sitting balance - Comments: Pt was able to complete alternating contralateral hip marches/shoulder flex with no UE/back support.    Standing balance support: Bilateral upper extremity supported;Single extremity supported;No upper extremity supported Standing balance-Leahy Scale: Good Standing balance comment: Pt was able to wash hands at sink with no UE support, min assist from PT for safety purposes.                              Pertinent Vitals/Pain Pain Assessment: 0-10 Pain Score: 0-No pain (0/10 resting and with therex, 6/10 with touch.) Pain Location: Abdomen Pain Descriptors / Indicators: Burning;Discomfort Pain Intervention(s):  (Abdomen pain didn't inhibit therex/abmulation)    Home Living Family/patient expects to be discharged to:: Private residence Living Arrangements: Alone Available Help at Discharge: Friend(s) ([employer]po[employer] that church members are available as discharge, but not on a consistent basis.  ) Type of Home: Apartment Home Access: Stairs to enter Entrance Stairs-Rails: None Entrance Stairs-Number of Steps: 1 step Home Layout: One level Home Equipment: None      Prior Function Level of Independence: Independent         Comments: required no assist device and was able to ambulate in community/at home  Hand Dominance        Extremity/Trunk Assessment   Upper Extremity Assessment: Overall WFL for tasks assessed (At least 4/5 for UE)           Lower Extremity Assessment: Overall WFL for tasks assessed (At least 4/5 for LE MMT)      Cervical / Trunk Assessment:  Normal  Communication   Communication: No difficulties  Cognition Arousal/Alertness: Awake/alert Behavior During Therapy: WFL for tasks assessed/performed Overall Cognitive Status: Within Functional Limits for tasks assessed                      General Comments General comments (skin integrity, edema, etc.): Pt didn't present with any additional drainage after therex/ambulation.     Exercises Other Exercises Other Exercises: Seated bilat hip marches/contralateral hip marches with shoulder flex/bilat shoulder abd with resisted isometric hip abd, 1 x 10 ( )  Other Exercises: Sitting/standing transfer at toilet with no UE assist, pt required min assist for safety purposes, x1      Assessment/Plan    PT Assessment Patient needs continued PT services  PT Diagnosis Abnormality of gait   PT Problem List Decreased strength;Decreased balance;Decreased mobility;Decreased activity tolerance;Decreased safety awareness  PT Treatment Interventions DME instruction;Gait training;Stair training;Functional mobility training;Therapeutic activities;Therapeutic exercise;Balance training;Patient/family education   PT Goals (Current goals can be found in the Care Plan section) Acute Rehab PT Goals Patient Stated Goal: to return to working out at [facility] PT Goal Formulation: With patient Time For Goal Achievement: 02/18/15 Potential to Achieve Goals: Good    Frequency Min 2X/week   Barriers to discharge Inaccessible home environment 1 step    Co-evaluation               End of Session Equipment Utilized During Treatment: Gait belt Activity Tolerance: Patient tolerated treatment well Patient left: in chair;with call bell/phone within reach (Notified RN about lack of chair alarm.) Nurse Communication: Mobility status         Time: 7829-5621 PT Time Calculation (min) (ACUTE ONLY): 38 min   Charges:         PT G CodesAngie Fava, SPT 02/04/2015 12:04  PM

## 2015-02-04 NOTE — Progress Notes (Signed)
Initial Nutrition Assessment       INTERVENTION:  Meals and snacks: Cater to pt preferences    NUTRITION DIAGNOSIS:   Inadequate oral intake related to altered GI function as evidenced by per patient/family report.    GOAL:   Patient will meet greater than or equal to 90% of their needs    MONITOR:    (Energy intake, Digestive system)  REASON FOR ASSESSMENT:   LOS    ASSESSMENT:      Pt s/p colostomy reversal  Past Medical History  Diagnosis Date  . Hypertension   . Diverticulitis   . Hypothyroidism   . Gout   . Arthritis     hands, feet, "all over"  . GERD (gastroesophageal reflux disease)   . CHF (congestive heart failure)     acute, after use of Redux  . Mitral regurgitation     after use of redux  . COPD (chronic obstructive pulmonary disease)     chronic bronchitis - no meds  . Wears contact lenses   . Full dentures     upper and lower  . Right rotator cuff tendonitis   . Lichen sclerosus     Vaginal Area  . Complication of anesthesia     on vent after colostomy revision    Current Nutrition: ate 2/3rd of half of grilled cheese sandwich for lunch today and few chips.  Ate most of breakfast this am per pt  Food/Nutrition-Related History: pt reports appetite has not been that good following surgery in Jan of this year   Medications: lasix, D5 at 51ml/hr  Electrolyte/Renal Profile and Glucose Profile:   Recent Labs Lab 02/01/15 0445 02/03/15 0442 02/04/15 0426  NA 140 142 145  K 4.1 3.4* 3.4*  CL 110 110 109  CO2 BUN CREATININE 0.82 0.71 0.81  CALCIUM 8.0* 7.8* 8.0*  GLUCOSE 150* 110* 121*     Last BM:8/05   Nutrition-Focused Physical Exam Findings: Nutrition-Focused physical exam completed. Findings are WDL for fat depletion, muscle depletion, and edema.     Weight Change: Initial weight loss during admission in December and Jan but has gained 20 pounds since that admission.      Diet Order:  Diet  regular Room service appropriate?: Yes; Fluid consistency:: Thin    Height:   Ht Readings from Last 1 Encounters:  02/04/15  (1.6 m)    Weight:   Wt Readings from Last 1 Encounters:  02/04/15 227 lb 8 oz (103.193 kg)    BMI:  Body mass index is 40.31 kg/(m^2).   EDUCATION NEEDS:   No education needs identified at this time  LOW Care Level  Kou Gucciardo B. Freida Busman, RD, LDN [phone] (pager)

## 2015-02-04 NOTE — Care Management Important Message (Signed)
Important Message  Patient Details  Name: Martha Chung MRN: [id number] Date of Birth: [birth year]   Medicare Important Message Given:  Yes-third notification given    [name] 02/04/2015, 10:17 AM

## 2015-02-05 NOTE — Progress Notes (Signed)
Patient ID: Martha Chung, female   DOB: 03/11/1941, 74 y.o.   MRN: [id number]   Surgery   She is without complaints  Filed Vitals:   02/04/15 2215 02/05/15 0110 02/05/15 0353 02/05/15 0835  BP: 141/64 138/52  132/56  Pulse: 95 77  78  Temp:  99 F (37.2 C)  98.4 F (36.9 C)  TempSrc:  Oral  Oral  Resp:    16  Height:      Weight:   102.558 kg (226 lb 1.6 oz)   SpO2:  93%  91%    PE:abd soft, wound clean  Labs  CBC Latest Ref Rng 02/04/2015 02/03/2015 02/01/2015  WBC 3.6 - 11.0 K/uL 8.6 9.4 13.5(H)  Hemoglobin 12.0 - 16.0 g/dL 6.6(M) 10.5(L) 11.2(L)  Hematocrit 35.0 - 47.0 % 29.5(L) 32.4(L) 34.4(L)  Platelets 150 - 440 K/uL 152 121(L) 154   CMP Latest Ref Rng 02/04/2015 02/03/2015 02/01/2015  Glucose 65 - 99 mg/dL 600(K) 599(H) 741(S)  BUN 6 - 20 mg/dL 10 7 10   Creatinine 0.44 - 1.00 mg/dL 2.39 5.32 0.23  Sodium 135 - 145 mmol/L 145 142 140  Potassium 3.5 - 5.1 mmol/L 3.4(L) 3.4(L) 4.1  Chloride 101 - 111 mmol/L 109 110 110  CO2 22 - 32 mmol/L 31 25 24   Calcium 8.9 - 10.3 mg/dL 8.0(L) 7.8(L) 8.0(L)  Total Protein 6.4-8.2 g/dL - - -  Total Bilirubin 0.2-1.0 mg/dL - - -  Alkaline Phos - - - -  AST 15-37 Unit/L - - -  ALT - - - -     IMP making good progress  Plan continue pain meds, mobilize, anticipate dc home with some home PT or [facility] RN.

## 2015-02-05 NOTE — Progress Notes (Signed)
Patient ID: Martha Chung, female   DOB: 08/25/1940, 74 y.o.   MRN: [id number]   Delayed entry  Patient doing well  No nausea or emesis  abd soft, wound clean, moderate drainage  Several BM's  Regular diet  Ambulate with PT.

## 2015-02-06 MED ORDER — TRAMADOL HCL 50 MG PO TABS: 50.0000 mg | ORAL_TABLET | Freq: Four times a day (QID) | ORAL | Status: DC | PRN

## 2015-02-06 NOTE — Progress Notes (Signed)
Patient ID: Martha Chung, female   DOB: 1941/05/12, 74 y.o.   MRN: [id number]   Surgery  POD 6   S/P reversal of colostomy partial colectomy  The patient denies any nausea vomiting. Pain seems well controlled on oral pain medications. She's had several bowel movements. She is tolerating a regular diet.  Filed Vitals:   02/05/15 2204 02/06/15 0131 02/06/15 0724 02/06/15 0821  BP: 121/52 116/54  112/53  Pulse: 84 63  82  Temp:  99 F (37.2 C)  98.8 F (37.1 C)  TempSrc:  Oral  Oral  Resp:  16  18  Height:      Weight:   102 kg (224 lb 13.9 oz)   SpO2:  88%  94%    PE:  Alert and oriented. No respiratory distress. Abdomen is soft and minimally tender. Penrose drain was removed with minimal drainage. Staples were left intact. There is no pus no cellulitis noted..   IMP:  She is doing very well. Anticipate discharge home with home health PT in the morning.  Plan  medical reconciliation form and home health orders have been written. Penrose drain was removed. Anticipate discharge on Monday follow-up on Thursday in the my office for staple removal.

## 2015-02-06 NOTE — Plan of Care (Signed)
Problem: Phase II Progression Outcomes Goal: Dressings dry/intact Outcome: Not Met (add Reason) Drainage from Penrose drain

## 2015-02-07 LAB — CREATININE, SERUM: CREATININE: 0.77 mg/dL (ref 0.44–1.00)

## 2015-02-07 NOTE — Discharge Instructions (Signed)
1.  Please contact your physician for any redness, swelling, discharge or bleeding from your incisions; pain uncontrolled by what you have been instructed to take; or fever greater than 100.4. °2.  Keep followup appointment as outlined above. °3.  Take medications as instructed. ° °

## 2015-02-07 NOTE — Care Management (Addendum)
Refferal placed with Becky Sax at [facility] . Packet faxed. Patient was open prior to this admission. Patient will need to call [facility] Medicare to change provider. Spoke with Elnita Maxwell at [facility]. Appointment will be made for Dr Oleta Mouse. Will contact patient today to discuss.

## 2015-02-07 NOTE — Progress Notes (Signed)
7 Days Post-Op   Subjective:  Feeling better this morning with less abdominal discomfort and no significant nausea. She is eating small amounts and is mildly anorexic but is having bowel movements.  Vital signs in last 24 hours: Temp:  [98.8 F (37.1 C)-99.2 F (37.3 C)] 99.2 F (37.3 C) (08/07 2355) Pulse Rate:  [74-83] 74 (08/07 2355) Resp:  [16-18] 18 (08/07 2355) BP: (112-144)/(42-53) 134/50 mmHg (08/07 2355) SpO2:  [94 %-99 %] 94 % (08/07 2355) Weight:  [102.422 kg (225 lb 12.8 oz)] 102.422 kg (225 lb 12.8 oz) (08/08 0500) Last BM Date: 02/07/15  Intake/Output from previous day: 08/07 0701 - 08/08 0700 In: 963 [I.V.:963] Out: 1900 [Urine:1900]  GI: Abdomen looks good with minimal drainage. There is no evidence for infection. Dressing was changed.  Lab Results:  CBC No results for input(s): WBC, HGB, HCT, PLT in the last 72 hours. CMP     Component Value Date/Time   NA 145 02/04/2015 0426   NA 136 07/19/2014 0243   K 3.4* 02/04/2015 0426   K 4.5 07/19/2014 0243   CL 109 02/04/2015 0426   CL 104 07/19/2014 0243   CO2 31 02/04/2015 0426   CO2 27 07/19/2014 0243   GLUCOSE 121* 02/04/2015 0426   GLUCOSE 126* 07/19/2014 0243   BUN 10 02/04/2015 0426   BUN 22* 07/19/2014 0243   CREATININE 0.77 02/07/2015 0511   CREATININE 0.74 07/19/2014 0243   CALCIUM 8.0* 02/04/2015 0426   CALCIUM 8.5 07/19/2014 0243   PROT 5.7* 07/12/2014 0404   ALBUMIN 1.4* 07/12/2014 0404   AST 26 07/12/2014 0404   ALT 43 07/12/2014 0404   ALKPHOS 135* 07/12/2014 0404   BILITOT 0.4 07/12/2014 0404   GFRNONAA >60 02/07/2015 0511   GFRAA >60 02/07/2015 0511   PT/INR No results for input(s): LABPROT, INR in the last 72 hours.  Studies/Results: No results found.  Assessment/Plan: She continues to improve. She feels as though she is comfortable being discharged today. We discussed bathing instructions and driving instructions. Discharge has been arranged. She is in agreement.

## 2015-02-07 NOTE — Care Management Important Message (Signed)
Important Message  Patient Details  Name: Martha Chung MRN: [id number] Date of Birth: February 16, 1941   Medicare Important Message Given:  Yes-fourth notification given    [name] 02/07/2015, 10:07 AM

## 2015-02-07 NOTE — Progress Notes (Signed)
Pt A and O x 4. VSS. Pt tolerating diet well. No complaints of pain or nausea. IV removed intact, prescriptions given. Pt voiced understanding of discharge instructions with no further questions. Abdominal dressing intact clean and dry. Pt discharged via wheelchair with axillary.

## 2015-02-10 ENCOUNTER — Telehealth: Payer: Self-pay

## 2015-02-10 ENCOUNTER — Ambulatory Visit (INDEPENDENT_AMBULATORY_CARE_PROVIDER_SITE_OTHER): Payer: Medicare HMO | Admitting: Surgery

## 2015-02-10 ENCOUNTER — Encounter: Payer: Self-pay | Admitting: Surgery

## 2015-02-10 DIAGNOSIS — K5720 Diverticulitis of large intestine with perforation and abscess without bleeding: Secondary | ICD-10-CM

## 2015-02-10 NOTE — Progress Notes (Signed)
Surgery clinic  She is postoperative day #10 status post colostomy takedown. She is doing well. Her main complaint is that his intermittent constipation. Over the last 48 hour she's had a substantial amount of drainage from her lower midline wound. It is not been purulent. There is been no fevers.  Physical examination: Staples were removed  right upper quadrant colostomy site is well-healed.  Midline site is well-healed. At the lower most Penrose drain exit site is an area that is open. This was opened widely. It was then debrided. No fascia is visible.  The area was packed with a 4 x 4 gauze ABDs and tape.  Impression doing well except for minor wound problems.  Plan we will contact her home health agency she will need assistance with this due to its location and the fact that she lives by herself. We will see her back in 1 week's time. I reassured her that this will likely get better with local wound care.

## 2015-02-10 NOTE — Telephone Encounter (Signed)
Called [facility] and spoke to [place], [place]. I told her that Dr. Egbert Garibaldi was ordering a home health nurse to go to patient's home and have her wound checked, cleaned and packed. Elnita Maxwell told me that she would write the order and then fax it to [place] for them to see the patient every other day/PRN. Patient was notified.

## 2015-02-10 NOTE — Patient Instructions (Signed)
Please call us if you have any questions or concerns. We will see you in one week.

## 2015-02-12 ENCOUNTER — Inpatient Hospital Stay
Admission: EM | Admit: 2015-02-12 | Discharge: 2015-03-03 | DRG: 908 | Disposition: A | Discharge status: Skilled Nursing Facility | Payer: Medicare HMO | Attending: Surgery | Admitting: Surgery

## 2015-02-12 ENCOUNTER — Encounter: Payer: Self-pay | Admitting: Emergency Medicine

## 2015-02-12 DIAGNOSIS — Z433 Encounter for attention to colostomy: Secondary | ICD-10-CM

## 2015-02-12 DIAGNOSIS — K660 Peritoneal adhesions (postprocedural) (postinfection): Secondary | ICD-10-CM | POA: Diagnosis present

## 2015-02-12 DIAGNOSIS — M1990 Unspecified osteoarthritis, unspecified site: Secondary | ICD-10-CM | POA: Insufficient documentation

## 2015-02-12 DIAGNOSIS — Z95828 Presence of other vascular implants and grafts: Secondary | ICD-10-CM

## 2015-02-12 DIAGNOSIS — E039 Hypothyroidism, unspecified: Secondary | ICD-10-CM | POA: Diagnosis present

## 2015-02-12 DIAGNOSIS — Z8249 Family history of ischemic heart disease and other diseases of the circulatory system: Secondary | ICD-10-CM

## 2015-02-12 DIAGNOSIS — R5082 Postprocedural fever: Secondary | ICD-10-CM

## 2015-02-12 DIAGNOSIS — Z90710 Acquired absence of both cervix and uterus: Secondary | ICD-10-CM | POA: Diagnosis not present

## 2015-02-12 DIAGNOSIS — N829 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, unspecified: Secondary | ICD-10-CM | POA: Diagnosis not present

## 2015-02-12 DIAGNOSIS — Z933 Colostomy status: Secondary | ICD-10-CM | POA: Diagnosis not present

## 2015-02-12 DIAGNOSIS — I10 Essential (primary) hypertension: Secondary | ICD-10-CM | POA: Diagnosis present

## 2015-02-12 DIAGNOSIS — K5780 Diverticulitis of intestine, part unspecified, with perforation and abscess without bleeding: Secondary | ICD-10-CM | POA: Diagnosis present

## 2015-02-12 DIAGNOSIS — J449 Chronic obstructive pulmonary disease, unspecified: Secondary | ICD-10-CM | POA: Diagnosis present

## 2015-02-12 DIAGNOSIS — T8183XA Persistent postprocedural fistula, initial encounter: Principal | ICD-10-CM | POA: Diagnosis present

## 2015-02-12 DIAGNOSIS — D72829 Elevated white blood cell count, unspecified: Secondary | ICD-10-CM | POA: Diagnosis present

## 2015-02-12 DIAGNOSIS — Z9049 Acquired absence of other specified parts of digestive tract: Secondary | ICD-10-CM | POA: Diagnosis present

## 2015-02-12 DIAGNOSIS — Z809 Family history of malignant neoplasm, unspecified: Secondary | ICD-10-CM

## 2015-02-12 DIAGNOSIS — Z825 Family history of asthma and other chronic lower respiratory diseases: Secondary | ICD-10-CM

## 2015-02-12 DIAGNOSIS — Z96653 Presence of artificial knee joint, bilateral: Secondary | ICD-10-CM | POA: Diagnosis present

## 2015-02-12 DIAGNOSIS — F4323 Adjustment disorder with mixed anxiety and depressed mood: Secondary | ICD-10-CM | POA: Diagnosis present

## 2015-02-12 DIAGNOSIS — Z6839 Body mass index (BMI) 39.0-39.9, adult: Secondary | ICD-10-CM

## 2015-02-12 DIAGNOSIS — Z9889 Other specified postprocedural states: Secondary | ICD-10-CM | POA: Diagnosis not present

## 2015-02-12 DIAGNOSIS — I509 Heart failure, unspecified: Secondary | ICD-10-CM | POA: Diagnosis present

## 2015-02-12 DIAGNOSIS — K9189 Other postprocedural complications and disorders of digestive system: Secondary | ICD-10-CM | POA: Diagnosis present

## 2015-02-12 DIAGNOSIS — Z87891 Personal history of nicotine dependence: Secondary | ICD-10-CM | POA: Diagnosis not present

## 2015-02-12 DIAGNOSIS — I340 Nonrheumatic mitral (valve) insufficiency: Secondary | ICD-10-CM | POA: Diagnosis present

## 2015-02-12 DIAGNOSIS — K5732 Diverticulitis of large intestine without perforation or abscess without bleeding: Secondary | ICD-10-CM | POA: Diagnosis present

## 2015-02-12 DIAGNOSIS — M109 Gout, unspecified: Secondary | ICD-10-CM | POA: Diagnosis present

## 2015-02-12 DIAGNOSIS — E669 Obesity, unspecified: Secondary | ICD-10-CM | POA: Diagnosis present

## 2015-02-12 DIAGNOSIS — R110 Nausea: Secondary | ICD-10-CM

## 2015-02-12 DIAGNOSIS — K219 Gastro-esophageal reflux disease without esophagitis: Secondary | ICD-10-CM | POA: Diagnosis present

## 2015-02-12 LAB — URINALYSIS COMPLETE WITH MICROSCOPIC (ARMC ONLY)
Bilirubin Urine: NEGATIVE
Glucose, UA: NEGATIVE mg/dL
KETONES UR: NEGATIVE mg/dL
NITRITE: NEGATIVE
PH: 7 (ref 5.0–8.0)
Protein, ur: 30 mg/dL — AB
SPECIFIC GRAVITY, URINE: 1.016 (ref 1.005–1.030)

## 2015-02-12 LAB — CBC
HEMATOCRIT: 32.6 % — AB (ref 35.0–47.0)
Hemoglobin: 10.6 g/dL — ABNORMAL LOW (ref 12.0–16.0)
MCH: 30 pg (ref 26.0–34.0)
MCHC: 32.6 g/dL (ref 32.0–36.0)
MCV: 92 fL (ref 80.0–100.0)
PLATELETS: 312 10*3/uL (ref 150–440)
RBC: 3.55 MIL/uL — AB (ref 3.80–5.20)
RDW: 14.6 % — AB (ref 11.5–14.5)
WBC: 10.6 10*3/uL (ref 3.6–11.0)

## 2015-02-12 LAB — BASIC METABOLIC PANEL
Anion gap: 8 (ref 5–15)
BUN: 12 mg/dL (ref 6–20)
CALCIUM: 7.8 mg/dL — AB (ref 8.9–10.3)
CO2: 28 mmol/L (ref 22–32)
CREATININE: 0.78 mg/dL (ref 0.44–1.00)
Chloride: 107 mmol/L (ref 101–111)
GFR calc Af Amer: >60 mL/min (ref >60)
GFR calc non Af Amer: >60 mL/min (ref >60)
GLUCOSE: 139 mg/dL — AB (ref 65–99)
Potassium: 2.8 mmol/L — CL (ref 3.5–5.1)
Sodium: 143 mmol/L (ref 135–145)

## 2015-02-12 MED ORDER — ONDANSETRON HCL 4 MG/2ML IJ SOLN
4.0000 mg | Freq: Four times a day (QID) | INTRAMUSCULAR | Status: DC | PRN
  Administered 2015-02-13 – 2015-02-22 (×5): 4 mg via INTRAVENOUS
  Filled 2015-02-12 (×7): qty 2

## 2015-02-12 MED ORDER — FAMOTIDINE 20 MG PO TABS
20.0000 mg | ORAL_TABLET | Freq: Two times a day (BID) | ORAL | Status: DC
  Administered 2015-02-12 – 2015-02-14 (×4): 20 mg via ORAL
  Filled 2015-02-12 (×4): qty 1

## 2015-02-12 MED ORDER — DEXTROSE 5 % IV SOLN
2.0000 g | INTRAVENOUS | Status: DC
  Administered 2015-02-13 – 2015-02-20 (×8): 2 g via INTRAVENOUS
  Filled 2015-02-12 (×12): qty 2

## 2015-02-12 MED ORDER — METRONIDAZOLE IN NACL 5-0.79 MG/ML-% IV SOLN
500.0000 mg | Freq: Three times a day (TID) | INTRAVENOUS | Status: DC
  Administered 2015-02-12 – 2015-03-01 (×50): 500 mg via INTRAVENOUS
  Filled 2015-02-12 (×55): qty 100

## 2015-02-12 MED ORDER — ENOXAPARIN SODIUM 40 MG/0.4ML ~~LOC~~ SOLN
40.0000 mg | SUBCUTANEOUS | Status: DC
  Administered 2015-02-12 – 2015-03-03 (×19): 40 mg via SUBCUTANEOUS
  Filled 2015-02-12 (×19): qty 0.4

## 2015-02-12 MED ORDER — ONDANSETRON 4 MG PO TBDP
4.0000 mg | ORAL_TABLET | Freq: Four times a day (QID) | ORAL | Status: DC | PRN
  Administered 2015-02-25: 4 mg via ORAL
  Filled 2015-02-12: qty 1

## 2015-02-12 MED ORDER — PANTOPRAZOLE SODIUM 40 MG IV SOLR
40.0000 mg | Freq: Every day | INTRAVENOUS | Status: DC
  Administered 2015-02-12 – 2015-03-02 (×19): 40 mg via INTRAVENOUS
  Filled 2015-02-12 (×19): qty 40

## 2015-02-12 MED ORDER — ACETAMINOPHEN 650 MG RE SUPP: 650.0000 mg | Freq: Four times a day (QID) | RECTAL | Status: DC | PRN

## 2015-02-12 MED ORDER — ACETAMINOPHEN 325 MG PO TABS: 650.0000 mg | ORAL_TABLET | Freq: Four times a day (QID) | ORAL | Status: DC | PRN

## 2015-02-12 MED ORDER — TRAMADOL HCL 50 MG PO TABS
100.0000 mg | ORAL_TABLET | Freq: Four times a day (QID) | ORAL | Status: DC | PRN
  Administered 2015-02-12 – 2015-02-21 (×7): 100 mg via ORAL
  Filled 2015-02-12 (×7): qty 2

## 2015-02-12 MED ORDER — METOPROLOL TARTRATE 50 MG PO TABS
50.0000 mg | ORAL_TABLET | Freq: Two times a day (BID) | ORAL | Status: DC
  Administered 2015-02-12 – 2015-03-01 (×31): 50 mg via ORAL
  Filled 2015-02-12 (×36): qty 1

## 2015-02-12 MED ORDER — ALLOPURINOL 100 MG PO TABS
100.0000 mg | ORAL_TABLET | Freq: Every morning | ORAL | Status: DC
  Administered 2015-02-12 – 2015-03-03 (×19): 100 mg via ORAL
  Filled 2015-02-12 (×19): qty 1

## 2015-02-12 MED ORDER — FUROSEMIDE 20 MG PO TABS
20.0000 mg | ORAL_TABLET | Freq: Every morning | ORAL | Status: DC
  Filled 2015-02-12: qty 1

## 2015-02-12 MED ORDER — MELOXICAM 7.5 MG PO TABS
7.5000 mg | ORAL_TABLET | Freq: Two times a day (BID) | ORAL | Status: DC
  Administered 2015-02-12 – 2015-02-14 (×4): 7.5 mg via ORAL
  Filled 2015-02-12 (×4): qty 1

## 2015-02-12 MED ORDER — LEVOTHYROXINE SODIUM 75 MCG PO TABS
75.0000 ug | ORAL_TABLET | Freq: Every day | ORAL | Status: DC
  Administered 2015-02-14 – 2015-03-03 (×18): 75 ug via ORAL
  Filled 2015-02-12 (×18): qty 1

## 2015-02-12 MED ORDER — POTASSIUM CHLORIDE 2 MEQ/ML IV SOLN
INTRAVENOUS | Status: DC
  Administered 2015-02-12 – 2015-02-14 (×4): via INTRAVENOUS
  Filled 2015-02-12 (×8): qty 1000

## 2015-02-12 MED ORDER — KCL IN DEXTROSE-NACL 20-5-0.45 MEQ/L-%-% IV SOLN
INTRAVENOUS | Status: DC
  Filled 2015-02-12 (×2): qty 1000

## 2015-02-12 MED ORDER — BOOST / RESOURCE BREEZE PO LIQD
1.0000 | Freq: Three times a day (TID) | ORAL | Status: DC
  Administered 2015-02-12 – 2015-02-17 (×14): 1 via ORAL

## 2015-02-12 NOTE — ED Provider Notes (Signed)
[facility] Emergency Department Provider Note  ____________________________________________  Time seen: On arrival  I have reviewed the triage vital signs and the nursing notes.   HISTORY  Chief Complaint Post-op Problem    HPI Martha Chung is a 74 y.o. female who presents with postop drainage from wound. Patient had a Hartmann procedure in January of this yearafter which she had some complications requiring revision. She had a takedown performed on August 1. She had been doing well but is having drainage from the lower midline incision at the site of prior surgical drain. She saw Dr. Colette Ribas 4 days ago but drainage is getting worse. She is concerned because she thinks she passed gas via her vagina. She called Dr. Michela Pitcher who recommended she come to the emergency department     Past Medical History  Diagnosis Date  . Hypertension   . Diverticulitis   . Hypothyroidism   . Gout   . Arthritis     hands, feet, "all over"  . GERD (gastroesophageal reflux disease)   . CHF (congestive heart failure)     acute, after use of Redux  . Mitral regurgitation     after use of redux  . COPD (chronic obstructive pulmonary disease)     chronic bronchitis - no meds  . Wears contact lenses   . Full dentures     upper and lower  . Right rotator cuff tendonitis   . Lichen sclerosus     Vaginal Area  . Complication of anesthesia     on vent after colostomy revision    Patient Active Problem List   Diagnosis Date Noted  . Diverticulitis large intestine 01/31/2015  . Diverticulitis 12/07/2014    Past Surgical History  Procedure Laterality Date  . Tonsillectomy    . Kidney surgery Right   . Cholecystectomy    . Colectomy with colostomy creation/hartmann procedure  07/03/14  . Laparotomy      construct new colostomy and place wound VAC  . Colonoscopy N/A 11/25/2014    Procedure: COLONOSCOPY;  Surgeon: Midge Minium, MD;  Location: [facility];  Service:  Gastroenterology;  Laterality: N/A;  . Polypectomy  11/25/2014    Procedure: POLYPECTOMY INTESTINAL;  Surgeon: Midge Minium, MD;  Location: [facility];  Service: Gastroenterology;;  . Abdominal hysterectomy  1967    Partial  . Joint replacement Right 2006    knee  . Joint replacement Left 2008    knee  . Correction overlapping toes Bilateral 2012    bilateral hammertoes #2 & #3 both feet  . Colostomy takedown N/A 01/31/2015    Procedure: COLOSTOMY TAKEDOWN, partial colectomy;  Surgeon: [name], MD;  Location: [facility] ORS;  Service: General;  Laterality: N/A;    Current Outpatient Rx  Name  Route  Sig  Dispense  Refill  . allopurinol (ZYLOPRIM) 100 MG tablet   Oral   Take 100 mg by mouth daily. AM         . CALCIUM PO   Oral   Take 600 mg by mouth 2 (two) times daily.          . clobetasol cream (TEMOVATE) 0.05 %   Topical   Apply 1 application topically Chung other day.         . famotidine (PEPCID) 20 MG tablet   Oral   Take 20 mg by mouth 2 (two) times daily.         . furosemide (LASIX) 20 MG tablet  Oral   Take 20 mg by mouth. AM         . levothyroxine (SYNTHROID, LEVOTHROID) 88 MCG tablet   Oral   Take 75 mcg by mouth daily before breakfast.          . meloxicam (MOBIC) 7.5 MG tablet   Oral   Take 7.5 mg by mouth 2 (two) times daily.         . metoprolol (LOPRESSOR) 50 MG tablet   Oral   Take 50 mg by mouth 2 (two) times daily.         . Multiple Vitamin (MULTIVITAMIN) capsule   Oral   Take 1 capsule by mouth daily.         Marland Kitchen POTASSIUM PO   Oral   Take 20 mEq by mouth daily.            Allergies Ivp dye; Morphine and related; Shellfish allergy; Macrodantin; and Nubain  Family History  Problem Relation Age of Onset  . Hypertension Mother   . COPD Mother   . Heart disease Mother   . Cancer Mother     Breast  . Heart disease Father   . COPD Father   . Cancer Brother 53    Lung  . Cancer Daughter 7    Brain     Social History Social History  Substance Use Topics  . Smoking status: Former Smoker    Quit date: 07/03/1999  . Smokeless tobacco: Never Used  . Alcohol Use: No    Review of Systems  Constitutional: Negative for fever. Eyes: Negative for visual changes. ENT: Negative for sore throat Cardiovascular: Negative for chest pain. Respiratory: Negative for shortness of breath. Gastrointestinal: Negative for abdominal pain, vomiting and diarrhea. Genitourinary: Negative for dysuria. Musculoskeletal: Negative for back pain. Skin: Negative for rash. Positive for wound drainage Neurological: Negative for headaches or focal weakness Psychiatric: Mild anxiety    ____________________________________________   PHYSICAL EXAM:  VITAL SIGNS: ED Triage Vitals  Enc Vitals Group     BP 02/12/15 0845 137/87 mmHg     Pulse Rate 02/12/15 0845 80     Resp 02/12/15 0845 16     Temp 02/12/15 0845 98.4 F (36.9 C)     Temp Source 02/12/15 0845 Oral     SpO2 02/12/15 0845 94 %     Weight 02/12/15 0845 225 lb (102.059 kg)     Height 02/12/15 0845 5\' 3"  (1.6 m)     Head Cir --      Peak Flow --      Pain Score 02/12/15 0846 6     Pain Loc --      Pain Edu? --      Excl. in GC? --      Constitutional: Alert and oriented. Well appearing and in no distress. Eyes: Conjunctivae are normal.  ENT   Head: Normocephalic and atraumatic.   Mouth/Throat: Mucous membranes are moist. Cardiovascular: Normal rate, regular rhythm. Normal and symmetric distal pulses are present in all extremities. No murmurs, rubs, or gallops. Respiratory: Normal respiratory effort without tachypnea nor retractions. Breath sounds are clear and equal bilaterally.  Gastrointestinal: Soft and non-tender in all quadrants. No distention. There is no CVA tenderness. Patient has a midline incision that the inferior portion she has opening in the wound with mostly clear drainage. She has no tenderness or erythema or  surrounding the wound area no evidence of infection Genitourinary: deferred Musculoskeletal: Nontender with normal range of motion in  all extremities. No lower extremity tenderness nor edema. Neurologic:  Normal speech and language. No gross focal neurologic deficits are appreciated. Skin:  Skin is warm, dry and intact. No rash noted. Psychiatric: Mood and affect are normal. Patient exhibits appropriate insight and judgment.  ____________________________________________    LABS (pertinent positives/negatives)  Labs Reviewed - No data to display  ____________________________________________   EKG  None  ____________________________________________    RADIOLOGY I have personally reviewed any xrays that were ordered on this patient: None  ____________________________________________   PROCEDURES  Procedure(s) performed: none  Critical Care performed:none  ____________________________________________   INITIAL IMPRESSION / ASSESSMENT AND PLAN / ED COURSE  Pertinent labs & imaging results that were available during my care of the patient were reviewed by me and considered in my medical decision making (see chart for details).  Consult to Dr. Michela Pitcher of general surgery he will see the patient in the emergency department.  ----------------------------------------- 9:46 AM on 02/12/2015 -----------------------------------------  Dr. Michela Pitcher will admit the patient  ____________________________________________   FINAL CLINICAL IMPRESSION(S) / ED DIAGNOSES  Final diagnoses:  Other postoperative complication involving digestive system     Martha Every, MD 02/12/15 [phone]

## 2015-02-12 NOTE — ED Notes (Signed)
Pt reports hx diverticulitis with colostomy with multiple issues recently removed. Now having access drainage, seen surgeon this week and ordered follow-up home health. Pt states "theu never showed up". Drainage reported as being brown. Also reports passing gas out of vagina. MD ordered to hold off on IV or blood at this time until seen by surgeon. Surgeon notified by Cyril Loosen MD.

## 2015-02-12 NOTE — H&P (Signed)
Martha Chung is a 74 y.o. female  recently discharged hospital following a colostomy takedown who presents to the emergency room with wound drainage and fecal material from her vagina.  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 She recovered uneventfully from the most recent procedure. However the bowel was so thick that she required a handsewn single-layer anastomosis. She was doing well was seen in the office 2 days ago. She has some mild wound drainage was set up for an outpatient wound evaluation by home health. That evaluation did not occur and she noted increasing drainage over the course of the evening.  This morning when she went to the bathroom she passed some air through her vagina twice and had some stool on the toilet tissue from her vagina. She has been moving her bowels reasonably well. She became quite alarmed and contact Hospital which suggested she be evaluated in the emergency room.  She's had no other significant problems that she's been home. She did have one episode of a chill couple of days ago but has not had any fever no nausea no vomiting no bloating no indigestion. She has had some mild anorexia. She is very concerned about the development of another fistula. The surgical service was consulted.  Past Medical History  Diagnosis Date  . Hypertension   . Diverticulitis   . Hypothyroidism   . Gout   . Arthritis     hands, feet, "all over"  . GERD (gastroesophageal reflux disease)   . CHF (congestive heart failure)     acute, after use of Redux  . Mitral regurgitation     after use of redux  . COPD (chronic obstructive pulmonary disease)     chronic bronchitis - no meds  . Wears contact lenses   . Full dentures     upper and lower  . Right rotator cuff tendonitis   . Lichen sclerosus     Vaginal Area  . Complication of anesthesia     on vent after colostomy  revision   Past Surgical History  Procedure Laterality Date  . Tonsillectomy    . Kidney surgery Right   . Cholecystectomy    . Colectomy with colostomy creation/hartmann procedure  07/03/14  . Laparotomy      construct new colostomy and place wound VAC  . Colonoscopy N/A 11/25/2014    Procedure: COLONOSCOPY;  Surgeon: Midge Minium, MD;  Location: [facility];  Service: Gastroenterology;  Laterality: N/A;  . Polypectomy  11/25/2014    Procedure: POLYPECTOMY INTESTINAL;  Surgeon: Midge Minium, MD;  Location: [facility];  Service: Gastroenterology;;  . Abdominal hysterectomy  1967    Partial  . Joint replacement Right 2006    knee  . Joint replacement Left 2008    knee  . Correction overlapping toes Bilateral 2012    bilateral hammertoes #2 & #3 both feet  . Colostomy takedown N/A 01/31/2015    Procedure: COLOSTOMY TAKEDOWN, partial colectomy;  Surgeon: [name], MD;  Location: [facility];  Service: General;  Laterality: N/A;   Social History   Social History  . Marital Status: Widowed    Spouse Name: N/A  . Number of Children: N/A  . Years of Education: N/A   Social History Main Topics  . Smoking status: Former Smoker    Quit date: 07/03/1999  . Smokeless tobacco: Never Used  . Alcohol Use: No  . Drug Use: No  .  Sexual Activity: Not Asked   Other Topics Concern  . None   Social History Narrative     Review of Systems  Constitutional: Positive for chills and malaise/fatigue. Negative for fever.  HENT: Negative.   Eyes: Negative.   Respiratory: Negative for cough, shortness of breath and wheezing.   Cardiovascular: Negative for chest pain and palpitations.  Gastrointestinal: Negative for heartburn, nausea, vomiting and abdominal pain.  Genitourinary: Positive for dysuria, urgency and frequency.  Musculoskeletal: Negative.   Skin: Negative.   Neurological: Positive for weakness.  Endo/Heme/Allergies: Negative.   Psychiatric/Behavioral: Negative.       PHYSICAL EXAM: BP 137/87 mmHg  Pulse 80  Temp(Src) 98.4 F (36.9 C) (Oral)  Resp 16  Ht  (1.6 m)  Wt 225 lb (102.059 kg)  BMI 39.87 kg/m2  SpO2 94%  Physical Exam  Constitutional: She is oriented to person, place, and time. She appears well-developed and well-nourished.  Mildly overweight  HENT:  Head: Normocephalic and atraumatic.  Eyes: EOM are normal. Pupils are equal, round, and reactive to light.  Neck: Normal range of motion. Neck supple.  Cardiovascular: Regular rhythm and normal heart sounds.   No murmur heard. Pulmonary/Chest: Effort normal and breath sounds normal.  Abdominal: Soft. Bowel sounds are normal.  Genitourinary: Vaginal discharge found.  Musculoskeletal: Normal range of motion. She exhibits no edema.  Neurological: She is alert and oriented to person, place, and time.  Skin: Skin is warm and dry.  Psychiatric:  Very anxious and tearful upset and crying out of control at times.   Abdomen has an open wound at the bottom of the incision which appears to be clean and granulating. I repacked it. The remainder of the wound looks good with only a small open area at the superior portion. I did not notice any purulent or feculent material from around her vagina or labia.  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 She lives by herself very concerned about this problem quite anxious in the emergency room today. We will readmit her to the hospital place her on IV antibiotics consider the imaging options available to [place]. I talk with her about possible diversion once again. She is in agreement with the current plan.   Tiney Rouge III, MD  02/12/2015, 9:46 AM

## 2015-02-12 NOTE — Progress Notes (Signed)
Spoke with Dr. Michela Pitcher regarding what appears to be solid material in urine.  Received verbal order to place foley.

## 2015-02-12 NOTE — Progress Notes (Signed)
Spoke with Dr. Michela Pitcher by phone to request order for pain medications.  Order for Tramadol received.

## 2015-02-12 NOTE — Progress Notes (Signed)
   02/12/15 1800  Clinical Encounter Type  Visited With Patient  Visit Type Initial  Referral From Nurse  Consult/Referral To Chaplain   Chaplain received an order to help patient with an HPOA and when arrived patient desired to complete it at a later time. She stated she wasn't up for it today if some one can come by tomorrow or even Monday.   [employer] Jaylynn Mcaleer [phone]

## 2015-02-12 NOTE — ED Notes (Signed)
Pt reports having a colostomy reversal on the 1st Discharged this Monday with continued clear and brown drainage from the site. Denies fever.

## 2015-02-13 ENCOUNTER — Inpatient Hospital Stay: Payer: Medicare HMO

## 2015-02-13 ENCOUNTER — Encounter: Payer: Self-pay | Admitting: Radiology

## 2015-02-13 DIAGNOSIS — N829 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, unspecified: Secondary | ICD-10-CM

## 2015-02-13 LAB — CBC
HCT: 29 % — ABNORMAL LOW (ref 35.0–47.0)
Hemoglobin: 9.2 g/dL — ABNORMAL LOW (ref 12.0–16.0)
MCH: 29.7 pg (ref 26.0–34.0)
MCHC: 31.9 g/dL — AB (ref 32.0–36.0)
MCV: 93.1 fL (ref 80.0–100.0)
Platelets: 279 10*3/uL (ref 150–440)
RBC: 3.11 MIL/uL — ABNORMAL LOW (ref 3.80–5.20)
RDW: 14.5 % (ref 11.5–14.5)
WBC: 9.8 10*3/uL (ref 3.6–11.0)

## 2015-02-13 LAB — BASIC METABOLIC PANEL
ANION GAP: 9 (ref 5–15)
BUN: 10 mg/dL (ref 6–20)
CALCIUM: 7.5 mg/dL — AB (ref 8.9–10.3)
CO2: 25 mmol/L (ref 22–32)
Chloride: 105 mmol/L (ref 101–111)
Creatinine, Ser: 0.79 mg/dL (ref 0.44–1.00)
GFR calc non Af Amer: >60 mL/min (ref >60)
Glucose, Bld: 123 mg/dL — ABNORMAL HIGH (ref 65–99)
POTASSIUM: 3.3 mmol/L — AB (ref 3.5–5.1)
Sodium: 139 mmol/L (ref 135–145)

## 2015-02-13 LAB — PROTIME-INR
INR: 1.19
Prothrombin Time: 15.3 seconds — ABNORMAL HIGH (ref 11.4–15.0)

## 2015-02-13 NOTE — Progress Notes (Signed)
Subjective:   She's feeling better today with no significant abdominal tenderness. She is still passing flatus per vagina. She does not appear to have any particle material in her Foley. She has no nausea and the tolerate liquid diet. CT scan today demonstrates what appears to be a fistulous connection between the vagina and colon. It's not possible to tell whether it Was at the anastomosis.  Vital signs in last 24 hours: Temp:  [97.6 F (36.4 C)-98.9 F (37.2 C)] 97.9 F (36.6 C) (08/14 1533) Pulse Rate:  [51-78] 78 (08/14 1533) Resp:  [16-19] 19 (08/14 0846) BP: (88-120)/(32-52) 117/45 mmHg (08/14 1533) SpO2:  [92 %-96 %] 92 % (08/14 1533) Last BM Date: 02/12/15  Intake/Output from previous day: 08/13 0701 - 08/14 0700 In: 1288.8 [I.V.:1288.8] Out: 290 [Urine:290]  Exam:  Her exam is unremarkable.  Lab Results:  CBC  Recent Labs  02/12/15 1018 02/13/15 0645  WBC 10.6 9.8  HGB 10.6* 9.2*  HCT 32.6* 29.0*  PLT 312 279   CMP     Component Value Date/Time   NA 139 02/13/2015 0645   NA 136 07/19/2014 0243   K 3.3* 02/13/2015 0645   K 4.5 07/19/2014 0243   CL 105 02/13/2015 0645   CL 104 07/19/2014 0243   CO2 25 02/13/2015 0645   CO2 27 07/19/2014 0243   GLUCOSE 123* 02/13/2015 0645   GLUCOSE 126* 07/19/2014 0243   BUN 10 02/13/2015 0645   BUN 22* 07/19/2014 0243   CREATININE 0.79 02/13/2015 0645   CREATININE 0.74 07/19/2014 0243   CALCIUM 7.5* 02/13/2015 0645   CALCIUM 8.5 07/19/2014 0243   PROT 5.7* 07/12/2014 0404   ALBUMIN 1.4* 07/12/2014 0404   AST 26 07/12/2014 0404   ALT 43 07/12/2014 0404   ALKPHOS 135* 07/12/2014 0404   BILITOT 0.4 07/12/2014 0404   GFRNONAA >60 02/13/2015 0645   GFRNONAA >60 07/19/2014 0243   GFRAA >60 02/13/2015 0645   GFRAA >60 07/19/2014 0243   PT/INR  Recent Labs  02/13/15 0645  LABPROT 15.3*  INR 1.19    Studies/Results: Ct Abdomen Pelvis Wo Contrast  02/13/2015   ADDENDUM REPORT: 02/13/2015 13:39  ADDENDUM: There  is new mild compression fracture upper endplate of T12 vertebral body of indeterminate age. Clinical correlation is necessary. Further correlation with MRI could be performed as clinically warranted.   Electronically Signed   By: Natasha Mead M.D.   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  COMPARISON:  06/29/2014  FINDINGS: Lung bases shows atelectasis or infiltrate right base posteriorly. Small atelectasis noted left lower lobe posteriorly. Sagittal images of the spine shows osteopenia and degenerative changes thoracolumbar spine. There is new low mild compression deformity upper endplate of T12 vertebral body. Clinical correlation is necessary. Again noted significant disc space flattening and Schmorl's node deformity at L2-L3 level. There is Schmorl's node deformity lower endplate of L3 vertebral body.  Unenhanced liver shows no biliary ductal dilatation. Unenhanced pancreas, spleen and adrenal glands are unremarkable. There is mild subcutaneous stranding probable edema or inflammation in right flank wall see axial image 46.  Small amount of fluid and small amount of subcutaneous air is noted in right lower quadrant at the site of previous colostomy. Study is limited without IV contrast. Although this may  be postsurgical in nature subcutaneous abscess cannot be excluded.  Small amount of air and skin straightening is noted in umbilical region probable postsurgical in nature.  Small amount of air and fluid noted midline deep anterior abdominal wall just above the fascia see axial image 75. Subcutaneous infectious cannot be excluded. Clinical correlation is necessary.  Unenhanced pancreas spleen and adrenal glands are unremarkable. Atherosclerotic calcifications of abdominal  aorta and iliac arteries.  Unenhanced kidneys shows no nephrolithiasis. No hydronephrosis or hydroureter. No calcified ureteral calculi are noted.  There is no mesenteric fluid collection. Oral contrast material was given to the patient.  There is a Foley catheter in a decompressed urinary bladder there is no pericecal inflammation.  The patient is status post partial colonic resection.  Colonic diverticula are noted in distal residual colon. There is thickening of colonic wall in distal colon see axial image 68.  Small amount of free fluid noted right posterior cul-de-sac.  Axial image 65 there is tubular extraluminal contrast material in mid pelvis just lateral to sigmoid colon. This is highly suspicious for a fistulous tract. There is a mixed collection containing air and contrast just above the urinary bladder probable within left vaginal cuff recess axial image 72 measures 3.4 cm. This is highly suspicious for colonic vaginal fistula. Clinical correlation is necessary. Further evaluation with barium enema could be performed.  IMPRESSION: 1. Status post colonic resection and colostomy takedown. Small subcutaneous fluid collection and small amount of air in right lower quadrant at the colostomy site. Although this may be postsurgical in nature infection cannot be excluded. Study is limited without IV contrast. There is small amount of deep subcutaneous fluid and small amount of air midline anterior pelvic wall axial image 74. Infection cannot be excluded. Clinical correlation is necessary. 2. Status postcholecystectomy. 3. No nephrolithiasis.  No hydronephrosis or hydroureter. 4. Distal colonic diverticula are noted. Mild thickening of distal colonic wall. Axial image 65 there is tubular extraluminal contrast material in mid pelvis just lateral to sigmoid colon. This is highly suspicious for a fistulous tract. There is a mixed collection containing air and contrast just above the urinary bladder probable within  left vaginal cuff recess axial image 72 measures 3.4 cm. This is highly suspicious for colonic vaginal fistula. Clinical correlation is necessary. Further evaluation with barium enema could be performed.  Electronically Signed: By: Natasha Mead M.D. 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 In discussing the case with her primary surgeon originally she did have a fistula identified at that time felt to be related to a perforated diverticulitis. However the fistula could not be identified was felt to be quite low in the pelvis. It is possible that this most recent surgery for ostomy repair bilateral stool to collect in the previous fistula. The other possibility is a leak at the site of the anastomosis which has caused the development of a new fistula at the vaginal stump.  I spoke with her in detail. This problem of course is quite frustrating for her. She will likely need another diversion possibly an ileostomy at this time and prolonged antibiotic therapy followed by a long period of observation until another attempt can be made. She may need to have vaginal and rectal contrast examinations. A possible option would be a vaginal advancement flap if a fistula can be identified.

## 2015-02-14 ENCOUNTER — Encounter: Payer: Self-pay | Admitting: Anesthesiology

## 2015-02-14 ENCOUNTER — Encounter
Admission: EM | Disposition: A | Discharge status: Skilled Nursing Facility | Payer: Self-pay | Source: Home / Self Care | Attending: Surgery

## 2015-02-14 ENCOUNTER — Inpatient Hospital Stay: Payer: Medicare HMO | Admitting: Certified Registered Nurse Anesthetist

## 2015-02-14 DIAGNOSIS — K9189 Other postprocedural complications and disorders of digestive system: Secondary | ICD-10-CM

## 2015-02-14 HISTORY — PX: APPLICATION OF WOUND VAC: SHX5189

## 2015-02-14 HISTORY — PX: LAPAROTOMY: SHX154

## 2015-02-14 LAB — TYPE AND SCREEN
ABO/RH(D): A POS
ANTIBODY SCREEN: NEGATIVE

## 2015-02-14 LAB — MRSA PCR SCREENING: MRSA BY PCR: NEGATIVE

## 2015-02-14 SURGERY — LAPAROTOMY, EXPLORATORY
Anesthesia: General | Wound class: Clean Contaminated

## 2015-02-14 MED ORDER — ROCURONIUM BROMIDE 100 MG/10ML IV SOLN
INTRAVENOUS | Status: DC | PRN
  Administered 2015-02-14: 10 mg via INTRAVENOUS
  Administered 2015-02-14: 20 mg via INTRAVENOUS
  Administered 2015-02-14: 40 mg via INTRAVENOUS

## 2015-02-14 MED ORDER — LACTATED RINGERS IV SOLN
INTRAVENOUS | Status: DC | PRN
  Administered 2015-02-14 (×2): via INTRAVENOUS

## 2015-02-14 MED ORDER — PROPOFOL 10 MG/ML IV BOLUS
INTRAVENOUS | Status: DC | PRN
  Administered 2015-02-14: 110 mg via INTRAVENOUS

## 2015-02-14 MED ORDER — IPRATROPIUM-ALBUTEROL 0.5-2.5 (3) MG/3ML IN SOLN
3.0000 mL | Freq: Once | RESPIRATORY_TRACT | Status: AC
  Administered 2015-02-14: 3 mL via RESPIRATORY_TRACT

## 2015-02-14 MED ORDER — OXYCODONE HCL 5 MG/5ML PO SOLN: 5.0000 mg | Freq: Once | ORAL | Status: DC | PRN

## 2015-02-14 MED ORDER — DEXTROSE-NACL 5-0.45 % IV SOLN
INTRAVENOUS | Status: DC
  Administered 2015-02-14 – 2015-02-21 (×11): via INTRAVENOUS
  Administered 2015-02-21: 1000 mL via INTRAVENOUS
  Administered 2015-02-22 – 2015-03-03 (×15): via INTRAVENOUS

## 2015-02-14 MED ORDER — ONDANSETRON HCL 4 MG/2ML IJ SOLN
4.0000 mg | Freq: Four times a day (QID) | INTRAMUSCULAR | Status: DC | PRN
  Administered 2015-02-18 – 2015-03-01 (×8): 4 mg via INTRAVENOUS
  Filled 2015-02-14 (×6): qty 2

## 2015-02-14 MED ORDER — DIPHENHYDRAMINE HCL 50 MG/ML IJ SOLN: 12.5000 mg | Freq: Four times a day (QID) | INTRAMUSCULAR | Status: DC | PRN

## 2015-02-14 MED ORDER — KETOROLAC TROMETHAMINE 30 MG/ML IJ SOLN
INTRAMUSCULAR | Status: DC | PRN
  Administered 2015-02-14: 30 mg via INTRAVENOUS

## 2015-02-14 MED ORDER — MIDAZOLAM HCL 2 MG/2ML IJ SOLN
INTRAMUSCULAR | Status: DC | PRN
  Administered 2015-02-14: 2 mg via INTRAVENOUS

## 2015-02-14 MED ORDER — SODIUM CHLORIDE 0.9 % IJ SOLN
9.0000 mL | INTRAMUSCULAR | Status: DC | PRN
  Administered 2015-02-20: 9 mL via INTRAVENOUS
  Filled 2015-02-14: qty 10

## 2015-02-14 MED ORDER — FENTANYL CITRATE (PF) 100 MCG/2ML IJ SOLN
INTRAMUSCULAR | Status: DC | PRN
  Administered 2015-02-14: 50 ug via INTRAVENOUS
  Administered 2015-02-14: 100 ug via INTRAVENOUS
  Administered 2015-02-14 (×2): 50 ug via INTRAVENOUS

## 2015-02-14 MED ORDER — SODIUM CHLORIDE 0.9 % IJ SOLN
INTRAMUSCULAR | Status: AC
  Filled 2015-02-14: qty 50

## 2015-02-14 MED ORDER — SUCCINYLCHOLINE CHLORIDE 20 MG/ML IJ SOLN
INTRAMUSCULAR | Status: DC | PRN
  Administered 2015-02-14: 120 mg via INTRAVENOUS

## 2015-02-14 MED ORDER — DEXAMETHASONE SODIUM PHOSPHATE 4 MG/ML IJ SOLN
INTRAMUSCULAR | Status: DC | PRN
  Administered 2015-02-14: 10 mg via INTRAVENOUS

## 2015-02-14 MED ORDER — FENTANYL 10 MCG/ML IV SOLN
INTRAVENOUS | Status: DC
  Administered 2015-02-14: 120 ug via INTRAVENOUS
  Administered 2015-02-14: 20:00:00 via INTRAVENOUS
  Administered 2015-02-15: 5 ug via INTRAVENOUS
  Administered 2015-02-15: 70 ug via INTRAVENOUS
  Administered 2015-02-15: 0 ug via INTRAVENOUS
  Administered 2015-02-15: 4 ug via INTRAVENOUS
  Filled 2015-02-14: qty 50

## 2015-02-14 MED ORDER — PHENYLEPHRINE HCL 10 MG/ML IJ SOLN
INTRAMUSCULAR | Status: DC | PRN
  Administered 2015-02-14 (×4): 100 ug via INTRAVENOUS

## 2015-02-14 MED ORDER — SUGAMMADEX SODIUM 200 MG/2ML IV SOLN
INTRAVENOUS | Status: DC | PRN
  Administered 2015-02-14: 200 mg via INTRAVENOUS

## 2015-02-14 MED ORDER — IPRATROPIUM-ALBUTEROL 0.5-2.5 (3) MG/3ML IN SOLN
RESPIRATORY_TRACT | Status: AC
  Administered 2015-02-14: 3 mL via RESPIRATORY_TRACT
  Filled 2015-02-14: qty 3

## 2015-02-14 MED ORDER — FENTANYL CITRATE (PF) 100 MCG/2ML IJ SOLN
25.0000 ug | INTRAMUSCULAR | Status: DC | PRN
  Administered 2015-02-14 (×2): 25 ug via INTRAVENOUS

## 2015-02-14 MED ORDER — NALOXONE HCL 0.4 MG/ML IJ SOLN: 0.4000 mg | INTRAMUSCULAR | Status: DC | PRN

## 2015-02-14 MED ORDER — OXYCODONE HCL 5 MG PO TABS: 5.0000 mg | ORAL_TABLET | Freq: Once | ORAL | Status: DC | PRN

## 2015-02-14 MED ORDER — IPRATROPIUM-ALBUTEROL 0.5-2.5 (3) MG/3ML IN SOLN: 3.0000 mL | Freq: Once | RESPIRATORY_TRACT | Status: DC | PRN

## 2015-02-14 MED ORDER — DIPHENHYDRAMINE HCL 12.5 MG/5ML PO ELIX
12.5000 mg | ORAL_SOLUTION | Freq: Four times a day (QID) | ORAL | Status: DC | PRN
  Filled 2015-02-14: qty 5

## 2015-02-14 MED ORDER — FENTANYL CITRATE (PF) 100 MCG/2ML IJ SOLN
INTRAMUSCULAR | Status: AC
  Administered 2015-02-14: 25 ug via INTRAVENOUS
  Filled 2015-02-14: qty 2

## 2015-02-14 MED ORDER — LIDOCAINE HCL (CARDIAC) 20 MG/ML IV SOLN
INTRAVENOUS | Status: DC | PRN
  Administered 2015-02-14: 100 mg via INTRAVENOUS

## 2015-02-14 MED ORDER — PROMETHAZINE HCL 25 MG/ML IJ SOLN
25.0000 mg | Freq: Four times a day (QID) | INTRAMUSCULAR | Status: DC | PRN
  Administered 2015-02-14 – 2015-02-17 (×2): 25 mg via INTRAVENOUS
  Filled 2015-02-14 (×2): qty 1

## 2015-02-14 SURGICAL SUPPLY — 50 items
BARRIER SKIN 2 1/4 (WOUND CARE) ×3 IMPLANT
BASIN GRAD PLASTIC 32OZ STRL (MISCELLANEOUS) ×3 IMPLANT
CANISTER SUCT 1200ML W/VALVE (MISCELLANEOUS) ×3 IMPLANT
CANISTER SUCT 3000ML (MISCELLANEOUS) ×3 IMPLANT
CATH TRAY 16F METER LATEX (MISCELLANEOUS) IMPLANT
CHLORAPREP W/TINT 26ML (MISCELLANEOUS) IMPLANT
DRAIN PENROSE 1/4X12 LTX (DRAIN) ×3 IMPLANT
DRAPE INCISE IOBAN 66X45 STRL (DRAPES) ×3 IMPLANT
DRAPE LAPAROTOMY 100X77 ABD (DRAPES) ×3 IMPLANT
DRAPE SHEET LG 3/4 BI-LAMINATE (DRAPES) ×3 IMPLANT
DRAPE UTILITY 15X26 TOWEL STRL (DRAPES) ×6 IMPLANT
DRSG EMULSION OIL 3X8 NADH (GAUZE/BANDAGES/DRESSINGS) ×3 IMPLANT
DRSG VAC ATS LRG SENSATRAC (GAUZE/BANDAGES/DRESSINGS) ×3 IMPLANT
ELECT CAUTERY BLADE 6.4 (BLADE) ×3 IMPLANT
GAUZE SPONGE 4X4 12PLY STRL (GAUZE/BANDAGES/DRESSINGS) IMPLANT
GLOVE BIO SURGEON STRL SZ7.5 (GLOVE) ×15 IMPLANT
GOWN STRL REUS W/ TWL LRG LVL3 (GOWN DISPOSABLE) ×4 IMPLANT
GOWN STRL REUS W/ TWL XL LVL3 (GOWN DISPOSABLE) ×4 IMPLANT
GOWN STRL REUS W/TWL LRG LVL3 (GOWN DISPOSABLE) ×2
GOWN STRL REUS W/TWL XL LVL3 (GOWN DISPOSABLE) ×2
HANDLE YANKAUER SUCT BULB TIP (MISCELLANEOUS) ×3 IMPLANT
JELLY LUB 2OZ STRL (MISCELLANEOUS) ×1
JELLY LUBE 2OZ STRL (MISCELLANEOUS) ×2 IMPLANT
KIT CATH CVC 3 LUMEN 7FR 8IN (MISCELLANEOUS) ×3 IMPLANT
KIT RM TURNOVER STRD PROC AR (KITS) ×3 IMPLANT
LABEL OR SOLS (LABEL) IMPLANT
LIGASURE 5MM LAPAROSCOPIC (INSTRUMENTS) IMPLANT
LOOP OSTOMY BRIDGE (OSTOMY) ×3 IMPLANT
NS IRRIG 1000ML POUR BTL (IV SOLUTION) ×6 IMPLANT
PACK BASIN MAJOR ARMC (MISCELLANEOUS) ×3 IMPLANT
PAD ABD DERMACEA PRESS 5X9 (GAUZE/BANDAGES/DRESSINGS) IMPLANT
PAD GROUND ADULT SPLIT (MISCELLANEOUS) ×3 IMPLANT
POUCH DRAIN  2 1/4 MED RED 181 (OSTOMY) ×3 IMPLANT
SEPRAFILM MEMBRANE 5X6 (MISCELLANEOUS) ×6 IMPLANT
SOL PREP PVP 2OZ (MISCELLANEOUS) ×3
SOLUTION PREP PVP 2OZ (MISCELLANEOUS) ×2 IMPLANT
SPONGE KITTNER 5P (MISCELLANEOUS) ×6 IMPLANT
STAPLER SKIN PROX 35W (STAPLE) ×3 IMPLANT
SUT BOLSTER W/RETENTN TUBE (SUTURE) ×3 IMPLANT
SUT CHROMIC 0 CT 1 (SUTURE) IMPLANT
SUT CHROMIC 3 0 SH 27 (SUTURE) ×6 IMPLANT
SUT ETHILON NAB BLK LR #2 30IN (SUTURE) ×6 IMPLANT
SUT PDS AB 1 TP1 96 (SUTURE) ×9 IMPLANT
SUT SILK 3-0 (SUTURE) ×3 IMPLANT
SUT TICRON 2-0 30IN 311381 (SUTURE) IMPLANT
SUT VIC AB 1 CTX 27 (SUTURE) ×6 IMPLANT
SUT VICRYL PLUS ABS 0 54 (SUTURE) ×3 IMPLANT
SYR BULB IRRIG 60ML STRL (SYRINGE) ×6 IMPLANT
TUBING CONNECTING 10 (TUBING) ×3 IMPLANT
WND VAC CANISTER 500ML (MISCELLANEOUS) ×3 IMPLANT

## 2015-02-14 NOTE — Progress Notes (Signed)
Patient ID: Martha Chung, female   DOB: [birth year], 73 y.o.   MRN: [id number]   Post op  Stable post op course  Discussed operative findings with her in detail.  foley clear  Ostomy looks pink and viable  VAC with good negative seal on monitor.  Plan PICC line in am  Will eventually need evaluation of vagina and distal colon.  May need outside referral to [facility].

## 2015-02-14 NOTE — OR Nursing (Signed)
NG tube connected to lcs

## 2015-02-14 NOTE — Op Note (Signed)
02/12/2015 - 02/14/2015  4:54 PM  PATIENT:  Martha Chung  75 y.o. female  PRE-OPERATIVE DIAGNOSIS:  Colovaginal fistula, anastomotic disruption  POST-OPERATIVE DIAGNOSIS:  Colovaginal fistula, anastomotic disruption  PROCEDURE:  Reopen recent laparotomy,  Extensive lysis of adhesions,  loop ileostomy Initial application of wound VAC, incisional.  SURGEON:  Surgeon(s) and Role:    * [name], MD - Primary    * Hulda Marin, MD - Assisting  ANESTHESIA: general  SPECIMEN:none   EBL: 100 cc  Description of procedure: W   With the patient in the supine position general endotracheal anesthesia was induced.  The patient was then padded in dorsal lithotomy. Pelvic examination was performed with speculum. I could identify a fistulous opening in the proximal vaginal cuff. It appeared to be on the left lateral aspect. There was no sign of malignancy. The patient was then returned supine. The patient's dressing was then removed and the patient's abdomen was widely prepped and draped with Betadine solution.   The prior midline skin incision was opened sharply above the prior scar in virgin skin.   Fascia was divided with scalpel. The peritoneum was entered sharply with Metzenbaum scissors. Careful and tedious adhesion lysis was then performed under the undersurface of the fascia. The fascia was then opened nearly the entire length under direct visualization. The existing PDS suture was removed. On the right lateral abdominal wall adhesion lysis was performed tediously with sharp dissection. No enterotomies were encountered or created. The small bowel appeared to be normal in caliber. The ileocecal sail and the ileocecal junction was identified. A section of small bowel 20 cm proximal the ileocecal valve was chosen for an ileostomy site. Penrose drain was placed through the mesentery.   The prior right sided ostomy incision was opened partially with cautery. A wheel of skin was excised with  cautery. Cruciate incision was fashion in the fascial layers. The small bowel was then brought up through the defect without any undue tension. With lap and needle count correct 2 the fascia was then reapproximated with looped #1 PDS suture with placement of 2 external retention sutures of #2-0 nylon tied over bolsters. Skin edges were reapproximated loosely with skin stapler. Ioban drape was then placed.    The Penrose drain was replaced with an ostomy bridge. Incisional wound VAC was placed with Adaptic foam. The ostomy was then matured utilizing working sutures of #3-0 chromic suture.  Ostomy appliance was placed negative pressure was applied with good seal. The patient was then subsequently extubated and taken to the recovery room with nasogastric tube in place along with Foley catheter.

## 2015-02-14 NOTE — Anesthesia Procedure Notes (Signed)
Procedure Name: Intubation Date/Time: 02/14/2015 2:50 PM Performed by: Junious Silk Pre-anesthesia Checklist: Patient identified, Patient being monitored, Timeout performed, Emergency Drugs available and Suction available Patient Re-evaluated:Patient Re-evaluated prior to inductionOxygen Delivery Method: Circle system utilized Preoxygenation: Pre-oxygenation with 100% oxygen Intubation Type: IV induction Ventilation: Mask ventilation without difficulty Laryngoscope Size: Mac and 3 Grade View: Grade I Tube type: Oral Tube size: 7.0 mm Number of attempts: 1 Airway Equipment and Method: Stylet Placement Confirmation: ETT inserted through vocal cords under direct vision,  positive ETCO2 and breath sounds checked- equal and bilateral Secured at: 21 cm Tube secured with: Tape Dental Injury: Teeth and Oropharynx as per pre-operative assessment

## 2015-02-14 NOTE — Transfer of Care (Signed)
Immediate Anesthesia Transfer of Care Note  Patient: Martha Chung  Procedure(s) Performed: Procedure(s): RE-OPEN EXPLORATORY LAPAROTOMY, COMPLEX LYSIS OF ADHESIONS, CONSTRUCTION OF LOOP ILEOSTOMY, CENTRAL LINE INSTERTION (N/A) APPLICATION OF WOUND VAC  Patient Location: PACU  Anesthesia Type:General  Level of Consciousness: sedated  Airway & Oxygen Therapy: Patient Spontanous Breathing and Patient connected to face mask oxygen  Post-op Assessment: Report given to RN  Post vital signs: Reviewed and stable  Last Vitals:  Filed Vitals:   02/14/15 1647  BP: 126/61  Pulse: 66  Temp: 37.3 C  Resp: 22    Complications: No apparent anesthesia complications

## 2015-02-14 NOTE — Progress Notes (Signed)
Subjective: Patient states that her abdomen still feels ok. Has continued to have stool out her vaginal cuff. She has been distraught and emotionally stressed due to this occurrence. She has been able to ambulate with ease.   Vital signs in last 24 hours: Temp:  [97.9 F (36.6 C)-98.6 F (37 C)] 98.3 F (36.8 C) (08/15 0755) Pulse Rate:  [65-78] 69 (08/15 0756) Resp:  [16] 16 (08/14 2339) BP: (91-117)/(30-68) 91/68 mmHg (08/15 0756) SpO2:  [92 %-96 %] 96 % (08/15 0755) Last BM Date: 02/13/15  Intake/Output from previous day: 08/14 0701 - 08/15 0700 In: 2863.6 [P.O.:640; I.V.:2223.6] Out: 1202 [Urine:1200; Stool:2]  Exam:  GEN: Emotional, but in no acute distress  Resp: CTA  CV: RRR  Abd: Soft, obese, mildly ttp along midline incision from prior surgery. Distal most 3cm open with gauze packing that was removed and replaced. No evidence of infection and fascia is intact on probing. GU: Foley in place draining clear urine  Lab Results:  CBC  Recent Labs  02/12/15 1018 02/13/15 0645  WBC 10.6 9.8  HGB 10.6* 9.2*  HCT 32.6* 29.0*  PLT 312 279   CMP     Component Value Date/Time   NA 139 02/13/2015 0645   NA 136 07/19/2014 0243   K 3.3* 02/13/2015 0645   K 4.5 07/19/2014 0243   CL 105 02/13/2015 0645   CL 104 07/19/2014 0243   CO2 25 02/13/2015 0645   CO2 27 07/19/2014 0243   GLUCOSE 123* 02/13/2015 0645   GLUCOSE 126* 07/19/2014 0243   BUN 10 02/13/2015 0645   BUN 22* 07/19/2014 0243   CREATININE 0.79 02/13/2015 0645   CREATININE 0.74 07/19/2014 0243   CALCIUM 7.5* 02/13/2015 0645   CALCIUM 8.5 07/19/2014 0243   PROT 5.7* 07/12/2014 0404   ALBUMIN 1.4* 07/12/2014 0404   AST 26 07/12/2014 0404   ALT 43 07/12/2014 0404   ALKPHOS 135* 07/12/2014 0404   BILITOT 0.4 07/12/2014 0404   GFRNONAA >60 02/13/2015 0645   GFRNONAA >60 07/19/2014 0243   GFRAA >60 02/13/2015 0645   GFRAA >60 07/19/2014 0243   PT/INR  Recent Labs  02/13/15 0645  LABPROT 15.3*  INR  1.19    Studies/Results: Ct Abdomen Pelvis Wo Contrast  02/13/2015   ADDENDUM REPORT: 02/13/2015 13:39  ADDENDUM: There is new mild compression fracture upper endplate of T12 vertebral body of indeterminate age. Clinical correlation is necessary. Further correlation with MRI could be performed as clinically warranted.   Electronically Signed   By: Natasha Mead M.D.   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  COMPARISON:  06/29/2014  FINDINGS: Lung bases shows atelectasis or infiltrate right base posteriorly. Small atelectasis noted left lower lobe posteriorly. Sagittal images of the spine shows osteopenia and degenerative changes thoracolumbar spine. There is new low mild compression deformity upper endplate of T12 vertebral body. Clinical correlation is necessary. Again noted significant disc space flattening and Schmorl's node deformity at L2-L3 level. There is Schmorl's node deformity lower endplate of L3 vertebral body.  Unenhanced liver shows no biliary ductal dilatation. Unenhanced pancreas, spleen and adrenal glands are unremarkable. There is mild subcutaneous stranding probable edema or inflammation in right flank wall see axial image 46.  Small amount of fluid and small amount of  subcutaneous air is noted in right lower quadrant at the site of previous colostomy. Study is limited without IV contrast. Although this may be postsurgical in nature subcutaneous abscess cannot be excluded.  Small amount of air and skin straightening is noted in umbilical region probable postsurgical in nature.  Small amount of air and fluid noted midline deep anterior abdominal wall just above the fascia see axial image 75. Subcutaneous infectious cannot be excluded. Clinical correlation  is necessary.  Unenhanced pancreas spleen and adrenal glands are unremarkable. Atherosclerotic calcifications of abdominal aorta and iliac arteries.  Unenhanced kidneys shows no nephrolithiasis. No hydronephrosis or hydroureter. No calcified ureteral calculi are noted.  There is no mesenteric fluid collection. Oral contrast material was given to the patient.  There is a Foley catheter in a decompressed urinary bladder there is no pericecal inflammation.  The patient is status post partial colonic resection.  Colonic diverticula are noted in distal residual colon. There is thickening of colonic wall in distal colon see axial image 68.  Small amount of free fluid noted right posterior cul-de-sac.  Axial image 65 there is tubular extraluminal contrast material in mid pelvis just lateral to sigmoid colon. This is highly suspicious for a fistulous tract. There is a mixed collection containing air and contrast just above the urinary bladder probable within left vaginal cuff recess axial image 72 measures 3.4 cm. This is highly suspicious for colonic vaginal fistula. Clinical correlation is necessary. Further evaluation with barium enema could be performed.  IMPRESSION: 1. Status post colonic resection and colostomy takedown. Small subcutaneous fluid collection and small amount of air in right lower quadrant at the colostomy site. Although this may be postsurgical in nature infection cannot be excluded. Study is limited without IV contrast. There is small amount of deep subcutaneous fluid and small amount of air midline anterior pelvic wall axial image 74. Infection cannot be excluded. Clinical correlation is necessary. 2. Status postcholecystectomy. 3. No nephrolithiasis.  No hydronephrosis or hydroureter. 4. Distal colonic diverticula are noted. Mild thickening of distal colonic wall. Axial image 65 there is tubular extraluminal contrast material in mid pelvis just lateral to sigmoid colon. This is highly suspicious for  a fistulous tract. There is a mixed collection containing air and contrast just above the urinary bladder probable within left vaginal cuff recess axial image 72 measures 3.4 cm. This is highly suspicious for colonic vaginal fistula. Clinical correlation is necessary. Further evaluation with barium enema could be performed.  Electronically Signed: By: Natasha Mead M.D. 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 Images personally reviewed and discussed with Dr. Egbert Garibaldi, Juliann Pulse and Excell Seltzer. Given fistula and degree of inflammation, patient will need to be diverted to allow the inflammation to decrease prior to attempting to address her fistula. Patient voiced understanding. Will plan for surgery later today.  Ricarda Frame, MD FACS General Surgeon [facility]

## 2015-02-14 NOTE — Anesthesia Preprocedure Evaluation (Signed)
Anesthesia Evaluation  Patient identified by MRN, date of birth, ID band Patient awake    Reviewed: Allergy & Precautions, H&P , NPO status , Patient's Chart, lab work & pertinent test results, reviewed documented beta blocker date and time   History of Anesthesia Complications Negative for: history of anesthetic complications  Airway Mallampati: II  TM Distance: >3 FB Neck ROM: full    Dental  (+) Edentulous Upper, Edentulous Lower, Upper Dentures, Lower Dentures   Pulmonary shortness of breath, neg sleep apnea, COPDneg recent URI, former smoker,  breath sounds clear to auscultation  Pulmonary exam normal       Cardiovascular Exercise Tolerance: Good hypertension, + Past MI and +CHF - CAD and - CABG Rhythm:regular Rate:Normal     Neuro/Psych negative neurological ROS  negative psych ROS   GI/Hepatic Neg liver ROS, GERD-  Controlled,  Endo/Other  neg diabetesHypothyroidism Morbid obesity  Renal/GU negative Renal ROS  negative genitourinary   Musculoskeletal   Abdominal   Peds  Hematology negative hematology ROS (+)   Anesthesia Other Findings Past Medical History:   Hypertension                                                 Diverticulitis                                               Hypothyroidism                                               Gout                                                         Arthritis                                                      Comment:hands, feet, "all over"   GERD (gastroesophageal reflux disease)                       CHF (congestive heart failure)                                 Comment:acute, after use of Redux   Mitral regurgitation                                           Comment:after use of redux   COPD (chronic obstructive pulmonary disease)                   Comment:chronic bronchitis - no meds   Wears contact lenses  Full dentures                                                  Comment:upper and lower   Right rotator cuff tendonitis                                Lichen sclerosus                                               Comment:Vaginal Area   Complication of anesthesia                                     Comment:on vent after colostomy revision    Reproductive/Obstetrics negative OB ROS                             Anesthesia Physical  Anesthesia Plan  ASA: IV  Anesthesia Plan: General ETT   Post-op Pain Management:    Induction:   Airway Management Planned:   Additional Equipment:   Intra-op Plan:   Post-operative Plan:   Informed Consent: I have reviewed the patients History and Physical, chart, labs and discussed the procedure including the risks, benefits and alternatives for the proposed anesthesia with the patient or authorized representative who has indicated his/her understanding and acceptance.   Dental Advisory Given  Plan Discussed with: CRNA and Anesthesiologist  Anesthesia Plan Comments:         Anesthesia Quick Evaluation

## 2015-02-14 NOTE — Progress Notes (Signed)
Initial Nutrition Assessment     INTERVENTION:  Nutrition Supplement Therapy: Agree with boost breeze for added nutrition. Will modify flavor per pt request   NUTRITION DIAGNOSIS:   Inadequate oral intake related to altered GI function as evidenced by NPO status.    GOAL:   Patient will meet greater than or equal to 90% of their needs    MONITOR:    (Energy  intake, digestive system, Electrolyte and renal profile)  REASON FOR ASSESSMENT:   Malnutrition Screening Tool    ASSESSMENT:      Pt admitted with post op fistula. Planning surgery this pm.  Current Nutrition: does not like clear liquids, has been drinking boost breeze but would like another flavor  Food/Nutrition-Related History: pt shakes head no when asked how she was eating prior to admission.  On recent admission at 8/05 pt reports poor po intake since surgery in January   Medications: lasix, D5 0.2% with KCl at 158ml/hr  Electrolyte/Renal Profile and Glucose Profile:   Recent Labs Lab 02/12/15 1018 02/13/15 0645  NA 143 139  K 2.8* 3.3*  CL 107 105  CO2 28 25  BUN 12 10  CREATININE 0.78 0.79  CALCIUM 7.8* 7.5*  GLUCOSE 139* 123*   Gastrointestinal Profile: Last BM:8/14   Nutrition-Focused Physical Exam Findings:  Unable to complete Nutrition-Focused physical exam at this time.     Weight Change: Pt with initial weight loss in December Jan from surgery but has gained 20 pounds back since then    Diet Order:  Diet NPO time specified Except for: Sips with Meds  Skin:    reviewed      Height:   Ht Readings from Last 1 Encounters:  02/12/15 5\' 3"  (1.6 m)    Weight:   Wt Readings from Last 1 Encounters:  02/12/15 222 lb 6.4 oz (100.88 kg)        BMI:  Body mass index is 39.41 kg/(m^2).  Estimated Nutritional Needs:   Kcal:  BEE 984 kcals (IF 1.2-1.5, AF 1.3) 0768-0881 kcals/d  Protein:  (1.2-1.5 g/kg) 62-78 g/d  Fluid:  (30-92ml/kg) 1560-182ml/d  EDUCATION  NEEDS:   No education needs identified at this time  MODERATE Care Level  Tiffanni Scarfo B. Freida Busman, RD, LDN [phone] (pager)

## 2015-02-14 NOTE — Progress Notes (Signed)
Pt Metroprolol held this AM due to low BP. Pt received orders for surgery this Afternoon;BP rechecked 113/46 HR 63; Dr. Tonita Cong notified and orders received to give metroprolol.

## 2015-02-15 ENCOUNTER — Inpatient Hospital Stay: Payer: Medicare HMO

## 2015-02-15 ENCOUNTER — Encounter: Payer: Self-pay | Admitting: Surgery

## 2015-02-15 DIAGNOSIS — K5732 Diverticulitis of large intestine without perforation or abscess without bleeding: Secondary | ICD-10-CM

## 2015-02-15 DIAGNOSIS — N829 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, unspecified: Secondary | ICD-10-CM

## 2015-02-15 DIAGNOSIS — K9189 Other postprocedural complications and disorders of digestive system: Secondary | ICD-10-CM

## 2015-02-15 LAB — CBC
HCT: 32.8 % — ABNORMAL LOW (ref 35.0–47.0)
HEMOGLOBIN: 10.4 g/dL — AB (ref 12.0–16.0)
MCH: 29.3 pg (ref 26.0–34.0)
MCHC: 31.8 g/dL — AB (ref 32.0–36.0)
MCV: 92.4 fL (ref 80.0–100.0)
PLATELETS: 338 10*3/uL (ref 150–440)
RBC: 3.56 MIL/uL — AB (ref 3.80–5.20)
RDW: 15.5 % — ABNORMAL HIGH (ref 11.5–14.5)
WBC: 14.7 10*3/uL — ABNORMAL HIGH (ref 3.6–11.0)

## 2015-02-15 LAB — BASIC METABOLIC PANEL
Anion gap: 7 (ref 5–15)
BUN: 7 mg/dL (ref 6–20)
CO2: 25 mmol/L (ref 22–32)
CREATININE: 0.85 mg/dL (ref 0.44–1.00)
Calcium: 8 mg/dL — ABNORMAL LOW (ref 8.9–10.3)
Chloride: 110 mmol/L (ref 101–111)
Glucose, Bld: 213 mg/dL — ABNORMAL HIGH (ref 65–99)
Potassium: 4.3 mmol/L (ref 3.5–5.1)
SODIUM: 142 mmol/L (ref 135–145)

## 2015-02-15 MED ORDER — DIPHENHYDRAMINE HCL 50 MG/ML IJ SOLN: 12.5000 mg | Freq: Four times a day (QID) | INTRAMUSCULAR | Status: DC | PRN

## 2015-02-15 MED ORDER — FENTANYL CITRATE (PF) 100 MCG/2ML IJ SOLN
12.5000 ug | INTRAMUSCULAR | Status: DC | PRN
  Administered 2015-02-15 – 2015-02-16 (×3): 12.5 ug via INTRAVENOUS
  Filled 2015-02-15 (×3): qty 2

## 2015-02-15 MED ORDER — DIPHENHYDRAMINE HCL 12.5 MG/5ML PO ELIX
12.5000 mg | ORAL_SOLUTION | Freq: Four times a day (QID) | ORAL | Status: DC | PRN
  Filled 2015-02-15: qty 5

## 2015-02-15 NOTE — Progress Notes (Signed)
Patient requested to turn Co2 sensor off because the alarm sound is aggravating her. Educated on importance of the alarm but insisted to turn it off.

## 2015-02-15 NOTE — Consult Note (Signed)
WOC wound consult note Reason for Consult:Midline abdominal wound, followed by surgical services at this time.  NPWT (VAC) dressing intact and in place at this time.   WOC ostomy consult note Stoma type/location: RLQ Ileostomy with midline VAC dressing. Patient was independent in colostomy care and is a former Engineer, civil (consulting).  We discussed the differences with the colostomy vs ileostomy and patient is knowledgeable.  She states that her ileostomy is more budded than her colostomy was and she is hopeful for successful pouching.  We will begin pouch changes after first VAC change.  Likely, Wednesday.  Ostomy pouching: 2pc. 2 1/4" with a barrier ring anticipated.    Enrolled patient in [employer] DC program: No WOC team will continue to follow and remain available to patient, medical and nursing teams.   Maple Hudson RN BSN CWON Pager [phone]

## 2015-02-15 NOTE — Care Management Important Message (Signed)
Important Message  Patient Details  Name: Martha Chung MRN: [id number] Date of Birth: 23-Jun-1941   Medicare Important Message Given:  Yes-second notification given    [name] 02/15/2015, 10:52 AM

## 2015-02-15 NOTE — Progress Notes (Signed)
1 Day Post-Op   Subjective: 74 year old female now 1 day status post loop ileostomy. Patient states that her pain is well controlled with her current PCA. Although she is depressed by her current state, she understands and is eagerly waiting to get better. She has remained with NG tube decompression with minimal output. She denies any nausea. Patient is unsure if she has had any BM or continued vaginal output since surgery. No acute complaints morning.  Vital signs in last 24 hours: Temp:  [97.5 F (36.4 C)-99.2 F (37.3 C)] 97.5 F (36.4 C) (08/16 0752) Pulse Rate:  [54-81] 70 (08/16 0752) Resp:  [15-25] 15 (08/16 0800) BP: (113-146)/(46-98) 138/55 mmHg (08/16 0752) SpO2:  [89 %-100 %] 97 % (08/16 0800) Last BM Date: 02/13/15  Intake/Output from previous day: 08/15 0701 - 08/16 0700 In: 3281.9 [P.O.:10; I.V.:3271.9] Out: 2520 [Urine:2300; Emesis/NG output:120; Blood:100]  GI: abnormal findings:  Ileostomy present in the right lower quadrant is pink and patent and productive of liquid stool. Midline incision covered with incisional wound VAC with good seal. No evidence of infection or wound breakdown. Abdomen remains soft and minimally tender L long incision sites.  Lab Results:  CBC  Recent Labs  02/13/15 0645 02/15/15 0426  WBC 9.8 14.7*  HGB 9.2* 10.4*  HCT 29.0* 32.8*  PLT 279 338   CMP     Component Value Date/Time   NA 142 02/15/2015 0426   NA 136 07/19/2014 0243   K 4.3 02/15/2015 0426   K 4.5 07/19/2014 0243   CL 110 02/15/2015 0426   CL 104 07/19/2014 0243   CO2 25 02/15/2015 0426   CO2 27 07/19/2014 0243   GLUCOSE 213* 02/15/2015 0426   GLUCOSE 126* 07/19/2014 0243   BUN 7 02/15/2015 0426   BUN 22* 07/19/2014 0243   CREATININE 0.85 02/15/2015 0426   CREATININE 0.74 07/19/2014 0243   CALCIUM 8.0* 02/15/2015 0426   CALCIUM 8.5 07/19/2014 0243   PROT 5.7* 07/12/2014 0404   ALBUMIN 1.4* 07/12/2014 0404   AST 26 07/12/2014 0404   ALT 43 07/12/2014 0404    ALKPHOS 135* 07/12/2014 0404   BILITOT 0.4 07/12/2014 0404   GFRNONAA >60 02/15/2015 0426   GFRNONAA >60 07/19/2014 0243   GFRAA >60 02/15/2015 0426   GFRAA >60 07/19/2014 0243   PT/INR  Recent Labs  02/13/15 0645  LABPROT 15.3*  INR 1.19    Studies/Results: Ct Abdomen Pelvis Wo Contrast  02/13/2015   ADDENDUM REPORT: 02/13/2015 13:39  ADDENDUM: There is new mild compression fracture upper endplate of T12 vertebral body of indeterminate age. Clinical correlation is necessary. Further correlation with MRI could be performed as clinically warranted.   Electronically Signed   By: Natasha Mead M.D.   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  COMPARISON:  06/29/2014  FINDINGS: Lung bases shows atelectasis or infiltrate right base posteriorly. Small atelectasis noted left lower lobe posteriorly. Sagittal images of the spine shows osteopenia and degenerative changes thoracolumbar spine. There is new low mild compression deformity upper endplate of T12 vertebral body. Clinical correlation is necessary. Again noted significant disc space flattening and Schmorl's node deformity at L2-L3 level. There is Schmorl's node deformity lower endplate of L3 vertebral body.  Unenhanced liver shows no biliary ductal dilatation. Unenhanced pancreas,  spleen and adrenal glands are unremarkable. There is mild subcutaneous stranding probable edema or inflammation in right flank wall see axial image 46.  Small amount of fluid and small amount of subcutaneous air is noted in right lower quadrant at the site of previous colostomy. Study is limited without IV contrast. Although this may be postsurgical in nature subcutaneous abscess cannot be excluded.  Small amount of air and skin  straightening is noted in umbilical region probable postsurgical in nature.  Small amount of air and fluid noted midline deep anterior abdominal wall just above the fascia see axial image 75. Subcutaneous infectious cannot be excluded. Clinical correlation is necessary.  Unenhanced pancreas spleen and adrenal glands are unremarkable. Atherosclerotic calcifications of abdominal aorta and iliac arteries.  Unenhanced kidneys shows no nephrolithiasis. No hydronephrosis or hydroureter. No calcified ureteral calculi are noted.  There is no mesenteric fluid collection. Oral contrast material was given to the patient.  There is a Foley catheter in a decompressed urinary bladder there is no pericecal inflammation.  The patient is status post partial colonic resection.  Colonic diverticula are noted in distal residual colon. There is thickening of colonic wall in distal colon see axial image 68.  Small amount of free fluid noted right posterior cul-de-sac.  Axial image 65 there is tubular extraluminal contrast material in mid pelvis just lateral to sigmoid colon. This is highly suspicious for a fistulous tract. There is a mixed collection containing air and contrast just above the urinary bladder probable within left vaginal cuff recess axial image 72 measures 3.4 cm. This is highly suspicious for colonic vaginal fistula. Clinical correlation is necessary. Further evaluation with barium enema could be performed.  IMPRESSION: 1. Status post colonic resection and colostomy takedown. Small subcutaneous fluid collection and small amount of air in right lower quadrant at the colostomy site. Although this may be postsurgical in nature infection cannot be excluded. Study is limited without IV contrast. There is small amount of deep subcutaneous fluid and small amount of air midline anterior pelvic wall axial image 74. Infection cannot be excluded. Clinical correlation is necessary. 2. Status postcholecystectomy. 3. No  nephrolithiasis.  No hydronephrosis or hydroureter. 4. Distal colonic diverticula are noted. Mild thickening of distal colonic wall. Axial image 65 there is tubular extraluminal contrast material in mid pelvis just lateral to sigmoid colon. This is highly suspicious for a fistulous tract. There is a mixed collection containing air and contrast just above the urinary bladder probable within left vaginal cuff recess axial image 72 measures 3.4 cm. This is highly suspicious for colonic vaginal fistula. Clinical correlation is necessary. Further evaluation with barium enema could be performed.  Electronically Signed: By: Natasha Mead M.D. 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 Ileostomy appears to be productive and working. Discussed with patient the likelihood of continued bowel movement and fistula output for the next several days. Will continue NG tube compression throughout today. Will continue IV hydration as well as PCA for pain control.  Ricarda Frame, MD FACS General Surgeon [facility]

## 2015-02-15 NOTE — Progress Notes (Signed)
Patient ID: Martha Chung, female   DOB: [birth year], 74 y.o.   MRN: [id number]   POD 1  Come ostomy function  Very depressed affect  Not sure whether can return home alone following this most recent operation  Filed Vitals:   02/15/15 1200 02/15/15 1221 02/15/15 1537 02/15/15 1559  BP:  107/40  102/45  Pulse:  69  71  Temp:  98.2 F (36.8 C)  98.3 F (36.8 C)  TempSrc:  Oral  Oral  Resp: Height:      Weight:      SpO2: 100% 100% 97% 98%   CBC Latest Ref Rng 02/15/2015 02/13/2015 02/12/2015  WBC 3.6 - 11.0 K/uL 14.7(H) 9.8 10.6  Hemoglobin 12.0 - 16.0 g/dL 10.4(L) 9.2(L) 10.6(L)  Hematocrit 35.0 - 47.0 % 32.8(L) 29.0(L) 32.6(L)  Platelets 150 - 440 K/uL 338 279 312    BMP Latest Ref Rng 02/15/2015 02/13/2015 02/12/2015  Glucose 65 - 99 mg/dL 657(Q) 469(G) 295(M)  BUN 6 - 20 mg/dL Creatinine 0.44 - 1.00 mg/dL 8.41 3.24 4.01  Sodium 135 - 145 mmol/L 142 139 143  Potassium 3.5 - 5.1 mmol/L 4.3 3.3(L) 2.8(LL)  Chloride 101 - 111 mmol/L 110 105 107  CO2 22 - 32 mmol/L Calcium 8.9 - 10.3 mg/dL 8.0(L) 7.5(L) 7.8(L)    PICC placed  IMP:  Stable.  Difficult problem but with diversion fecal soilage should stop.  Will need Rehab on discharge  PLan:  Dc ngt, dc scd's Dec ivf' PT and case management to see in am.  Discussed with RN staff.

## 2015-02-15 NOTE — Progress Notes (Signed)
   02/15/15 1000  Clinical Encounter Type  Visited With Patient  Visit Type Initial  Referral From Chaplain;Nurse  Spiritual Encounters  Spiritual Needs Emotional  Stress Factors  Patient Stress Factors Exhausted  Chaplain Zamier Eggebrecht followed up with patient per another chaplain request. Patient appeared sleep or just waking up. Chaplain engaged patient and patient stated she was in pain. Chaplain stated he would follow up when there was a better time. Chaplain Maisie Fus

## 2015-02-15 NOTE — Anesthesia Postprocedure Evaluation (Signed)
  Anesthesia Post-op Note  Patient: Martha Chung  Procedure(s) Performed: Procedure(s): RE-OPEN EXPLORATORY LAPAROTOMY, COMPLEX LYSIS OF ADHESIONS, CONSTRUCTION OF LOOP ILEOSTOMY, (N/A) APPLICATION OF WOUND VAC  Anesthesia type:General ETT  Patient location: PACU  Post pain: Pain level controlled  Post assessment: Post-op Vital signs reviewed, Patient's Cardiovascular Status Stable, Respiratory Function Stable, Patent Airway and No signs of Nausea or vomiting  Post vital signs: Reviewed and stable  Last Vitals:  Filed Vitals:   02/15/15 1221  BP: 107/40  Pulse: 69  Temp: 36.8 C  Resp: 16    Level of consciousness: awake, alert  and patient cooperative  Complications: No apparent anesthesia complications

## 2015-02-15 NOTE — Progress Notes (Signed)
Per [employer] is okay to use PICC line access. Attempted to call Dr. Egbert Garibaldi but unable to get in touch. Will call back later.

## 2015-02-16 MED ORDER — OXYCODONE-ACETAMINOPHEN 5-325 MG PO TABS
1.0000 | ORAL_TABLET | ORAL | Status: DC | PRN
  Administered 2015-02-17: 2 via ORAL
  Filled 2015-02-16: qty 2

## 2015-02-16 NOTE — Progress Notes (Signed)
   02/16/15 1333  Clinical Encounter Type  Visited With Patient  Visit Type Follow-up  Referral From Chaplain  Consult/Referral To Chaplain  Spiritual Encounters  Spiritual Needs Emotional;Grief support  Stress Factors  Patient Stress Factors Health changes;Loss  Chaplain went to visit with patient and offered a compassionate presence and spiritual support as applicable. Patient is struggling with loss of independence and grief associated with her condition. Chaplain Kasia Trego A. Taisia Fantini Ext. [phone]

## 2015-02-16 NOTE — Progress Notes (Signed)
PT Cancellation Note  Patient Details Name: Martha Chung MRN: [id number] DOB: [birth year]   Cancelled Treatment:    Reason Eval/Treat Not Completed: Patient declined, no reason specified. Attempted to evaluate patient. Patient tearful throughout conversation. States, "I want to die." Pt refuses to participate with therapy. States, "they can just ship me off somewhere." Explained importance of participation with PT for discharge planning. Pt continues to refuse. CSW and RN notified of conversation. Pt may benefit from behavioral med and chaplain consult. Will attempt evaluation on later date as patient is willing to participate.  Sharalyn Ink Maddux V[place]oc PT, DPT   [name] 02/16/2015, 10:24 AM

## 2015-02-16 NOTE — Progress Notes (Signed)
2 Days Post-Op   Subjective:  74 year old female POD #2 s/p ileostomy. Ostomy is functioning well. NGT was removed overnight without any nausea or vomiting. Reports being very depressed this AM.Denies any fevers or chills. Had another bout of stool out of her rectum and vagina overnight.  Vital signs in last 24 hours: Temp:  [97.4 F (36.3 C)-98.9 F (37.2 C)] 97.4 F (36.3 C) (08/17 0800) Pulse Rate:  [69-86] 80 (08/17 0800) Resp:  [16-23] 16 (08/16 2301) BP: (102-140)/(40-56) 140/56 mmHg (08/17 0800) SpO2:  [93 %-100 %] 94 % (08/17 0800) Last BM Date: 02/13/15  Intake/Output from previous day: 08/16 0701 - 08/17 0700 In: 2142 [P.O.:30; I.V.:2093; IV Piggyback:19] Out: 2150 [Urine:1400; Emesis/NG output:300; Drains:50; Stool:400]  GI: Soft, appropriately TTP along incisions, ileostomy is pink and productive. Mild TTP in LLQ. Bowel sounds present  Lab Results:  CBC  Recent Labs  02/15/15 0426  WBC 14.7*  HGB 10.4*  HCT 32.8*  PLT 338   CMP     Component Value Date/Time   NA 142 02/15/2015 0426   NA 136 07/19/2014 0243   K 4.3 02/15/2015 0426   K 4.5 07/19/2014 0243   CL 110 02/15/2015 0426   CL 104 07/19/2014 0243   CO2 25 02/15/2015 0426   CO2 27 07/19/2014 0243   GLUCOSE 213* 02/15/2015 0426   GLUCOSE 126* 07/19/2014 0243   BUN 7 02/15/2015 0426   BUN 22* 07/19/2014 0243   CREATININE 0.85 02/15/2015 0426   CREATININE 0.74 07/19/2014 0243   CALCIUM 8.0* 02/15/2015 0426   CALCIUM 8.5 07/19/2014 0243   PROT 5.7* 07/12/2014 0404   ALBUMIN 1.4* 07/12/2014 0404   AST 26 07/12/2014 0404   ALT 43 07/12/2014 0404   ALKPHOS 135* 07/12/2014 0404   BILITOT 0.4 07/12/2014 0404   GFRNONAA >60 02/15/2015 0426   GFRNONAA >60 07/19/2014 0243   GFRAA >60 02/15/2015 0426   GFRAA >60 07/19/2014 0243   PT/INR No results for input(s): LABPROT, INR in the last 72 hours.  Studies/Results: Dg Chest Port 1 View  02/15/2015   CLINICAL DATA:  Adjustment of right upper  extremity PICC placed at bedside earlier today.  EXAM: PORTABLE CHEST - 1 VIEW 1731 hr:  COMPARISON:  Portable chest x-ray earlier same date 1545 hr and earlier.  FINDINGS: Right arm PICC tip now projects over the mid SVC. Nasogastric tube courses below the diaphragm into the stomach.  Markedly suboptimal inspiration due to body habitus with atelectasis at the right lung base, unchanged. No new pulmonary parenchymal abnormalities elsewhere since earlier same date.  IMPRESSION: 1. Right arm PICC tip now projects over the mid SVC. 2. Suboptimal inspiration with atelectasis at the right lung base, unchanged since earlier in the day. No new abnormalities.   Electronically Signed   By: Hulan Saas M.D.   On: 02/15/2015 18:00   Dg Chest Port 1 View  02/15/2015   CLINICAL DATA:  PICC line placement  EXAM: PORTABLE CHEST - 1 VIEW  COMPARISON:  07/11/2014 and older studies  FINDINGS: Right PICC line has been placed with tip about 3.3 cm into the right atrium. NG tube is noted over the stomach but tip is not visible. No pneumothorax. Limited inspiratory effect. Left perihilar and right lower lobe opacities. 2cm sclerotic lesion proximal right humerus unchanged from  IMPRESSION: Bilateral parenchymal opacities could be due to atelectasis or pneumonia.  Right PICC line as described above tip 3.3 cm into the right atrium.  No  pneumothorax.  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   Electronically Signed   By: Esperanza Heir M.D.   On: 02/15/2015 16:25    Assessment/Plan: 74 y/o female s/p loop ileostomy Ostomy functioning well Will start PO pain meds Will get BH/pastoral care for depression  Ricarda Frame, MD [facility] General Surgeon [facility] Surgical

## 2015-02-16 NOTE — Care Management (Addendum)
Received report form Ria Comment PT who stated patient very depressed today and refusing to work with PT .  Patient stated" I just want to die"  Contacted Dr Sharolyn Douglas on call surgical, received telephone order for [facility] consult.  Anticipate patient wil discharge to STR/SNF. Discussed with CSW [facility]. Continue to follow.

## 2015-02-16 NOTE — Progress Notes (Signed)
Spoke with Judeth Cornfield, [facility], before administering fentanyl - not usually given on this floor - she stated that it is ok to give and we reviewed monitoring perimeters.

## 2015-02-17 ENCOUNTER — Encounter: Payer: Self-pay | Admitting: Surgery

## 2015-02-17 DIAGNOSIS — F4323 Adjustment disorder with mixed anxiety and depressed mood: Secondary | ICD-10-CM

## 2015-02-17 LAB — BASIC METABOLIC PANEL
ANION GAP: 8 (ref 5–15)
BUN: 6 mg/dL (ref 6–20)
CHLORIDE: 107 mmol/L (ref 101–111)
CO2: 26 mmol/L (ref 22–32)
Calcium: 7.9 mg/dL — ABNORMAL LOW (ref 8.9–10.3)
Creatinine, Ser: 0.73 mg/dL (ref 0.44–1.00)
GFR calc non Af Amer: >60 mL/min (ref >60)
Glucose, Bld: 132 mg/dL — ABNORMAL HIGH (ref 65–99)
POTASSIUM: 3.5 mmol/L (ref 3.5–5.1)
SODIUM: 141 mmol/L (ref 135–145)

## 2015-02-17 LAB — MAGNESIUM: MAGNESIUM: 1.6 mg/dL — AB (ref 1.7–2.4)

## 2015-02-17 LAB — PHOSPHORUS: PHOSPHORUS: 3.2 mg/dL (ref 2.5–4.6)

## 2015-02-17 LAB — CBC
HEMATOCRIT: 30.8 % — AB (ref 35.0–47.0)
HEMOGLOBIN: 9.9 g/dL — AB (ref 12.0–16.0)
MCH: 29.5 pg (ref 26.0–34.0)
MCHC: 32.1 g/dL (ref 32.0–36.0)
MCV: 91.7 fL (ref 80.0–100.0)
Platelets: 315 10*3/uL (ref 150–440)
RBC: 3.36 MIL/uL — AB (ref 3.80–5.20)
RDW: 15.1 % — ABNORMAL HIGH (ref 11.5–14.5)
WBC: 10.2 10*3/uL (ref 3.6–11.0)

## 2015-02-17 MED ORDER — MAGNESIUM OXIDE 400 (241.3 MG) MG PO TABS
400.0000 mg | ORAL_TABLET | Freq: Once | ORAL | Status: AC
  Administered 2015-02-17: 400 mg via ORAL
  Filled 2015-02-17: qty 1

## 2015-02-17 NOTE — Consult Note (Signed)
[facility] Face-to-Face Psychiatry Consult   Reason for Consult:  Consult for this 74 year old woman with a history of multiple medical problems. Concern about depression Referring Physician:  Marina Gravel Patient Identification: Martha Chung MRN:  [id number] Principal Diagnosis: Adjustment disorder with mixed anxiety and depressed mood Diagnosis:   Patient Active Problem List   Diagnosis Date Noted  . Adjustment disorder with mixed anxiety and depressed mood [F43.23] 02/17/2015  . Other postoperative complication involving digestive system [K91.89]   . 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 Arthritis [M19.90] 02/12/2015  . BP (high blood pressure) [I10] 02/12/2015  . Diverticulitis large intestine [K57.32] 01/31/2015  . Diverticulitis [K57.92] 12/07/2014  . 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 Essential (primary) hypertension [I10] 03/31/2014  . Acid reflux [K21.9] 03/31/2014  . Gout [M10.9] 03/31/2014  . Adult hypothyroidism [E03.9] 03/31/2014  . Osteopenia [M85.80] 03/31/2014    Total Time spent with patient: 1 hour  Subjective:   Martha Chung is a 74 y.o. female patient admitted with "yesterday was a bad day, and I think going through a lot".  HPI:  Information from the patient and the chart. 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 She reports that her mood has been feeling very stressed out. Yesterday was particularly bad because she was worried about her finances. That problem seems to of been resolved for the moment. At the same time she feels sick and tired of all medical problems that she is been having. She admits that yesterday she made statements about wishing that she were dead. She tells me that she is not having those thoughts today and absolutely denies that she had any thought about actually killing herself. Mood is been good. Sleep had  been chronically somewhat poor related to her medical problems. She was not having any suicidal thoughts at all. Only since coming back to the hospital is she feeling very frustrated. She doesn't report any hallucinations or psychotic symptoms. She is not currently taking any psychiatric medicine. Stresses include loneliness because she is living by herself having to take care of these medical problems.  Past psychiatric history: No history of psychiatric treatment anytime recently and no psychiatric hospitalization. Years ago she had lost 2 [place] children who died. She went through a period of depression and think she might of been on psychiatric medicine. She does not recall for sure. She denies any history of suicide attempts. Not currently in any therapy.  Medical history: Patient had a ruptured colon late last year and has had multiple surgeries now to try and deal with this and it's complications. Additionally has diverticulitis history of gout high blood pressure.  Social history: Patient lives by herself. In the area she has 2 grandchildren but doesn't get along with them well because she says they have drug problems. She has adult children but none of them live close by in the area. She is thinking about moving to [place] to be closer to one of them. Patient plays the [employer] and evidently does some work whether paid or volunteer I can't be sure. That seems to be a major part of her life. Church involvement is important for her.  Substance abuse history: Denies any history of any alcohol or drug problems now or in the past.  Family history: Grandchildren with substance abuse problems nothing else identified.   HPI Elements:   Quality:  Depressed mood and anxiety and transient statements potentially suicidal. Severity:  Moderate severe at the time. Timing:  Happening during the low point of her anxieties about her current problems. Duration:  Resolving. Context:  Severe medical problems as  well as social isolation.  Past Medical History:  Past Medical History  Diagnosis Date  . Hypertension   . Diverticulitis   . Hypothyroidism   . Gout   . Arthritis     hands, feet, "all over"  . GERD (gastroesophageal reflux disease)   . CHF (congestive heart failure)     acute, after use of Redux  . Mitral regurgitation     after use of redux  . COPD (chronic obstructive pulmonary disease)     chronic bronchitis - no meds  . Wears contact lenses   . Full dentures     upper and lower  . Right rotator cuff tendonitis   . Lichen sclerosus     Vaginal Area  . Complication of anesthesia     on vent after colostomy revision    Past Surgical History  Procedure Laterality Date  . Tonsillectomy    . Kidney surgery Right   . Cholecystectomy    . Colectomy with colostomy creation/hartmann procedure  07/03/14  . Laparotomy      construct new colostomy and place wound VAC  . Colonoscopy N/A 11/25/2014    Procedure: COLONOSCOPY;  Surgeon: Lucilla Lame, MD;  Location: [place];  Service: Gastroenterology;  Laterality: N/A;  . Polypectomy  11/25/2014    Procedure: POLYPECTOMY INTESTINAL;  Surgeon: Lucilla Lame, MD;  Location: [place];  Service: Gastroenterology;;  . Abdominal hysterectomy  1967    Partial  . Joint replacement Right 2006    knee  . Joint replacement Left 2008    knee  . Correction overlapping toes Bilateral 2012    bilateral hammertoes #2 & #3 both feet  . Colostomy takedown N/A 01/31/2015    Procedure: COLOSTOMY TAKEDOWN, partial colectomy;  Surgeon: Sherri Rad, MD;  Location: [facility];  Service: General;  Laterality: N/A;  . Laparotomy N/A 02/14/2015    Procedure: RE-OPEN EXPLORATORY LAPAROTOMY, COMPLEX LYSIS OF ADHESIONS, CONSTRUCTION OF LOOP ILEOSTOMY,;  Surgeon: Sherri Rad, MD;  Location: [facility];  Service: General;  Laterality: N/A;  . Application of wound vac  02/14/2015    Procedure: APPLICATION OF WOUND VAC;  Surgeon: Sherri Rad, MD;  Location:  [facility];  Service: General;;   Family History:  Family History  Problem Relation Age of Onset  . Hypertension Mother   . COPD Mother   . Heart disease Mother   . Cancer Mother     Breast  . Heart disease Father   . COPD Father   . Cancer Brother 45    Lung  . Cancer Daughter 7    Brain   Social History:  History  Alcohol Use No     History  Drug Use No    Social History   Social History  . Marital Status: Widowed    Spouse Name: N/A  . Number of Children: N/A  . Years of Education: N/A   Social History Main Topics  . Smoking status: Former Smoker    Quit date: 07/03/1999  . Smokeless tobacco: Never Used  . Alcohol Use: No  . Drug Use: No  . Sexual Activity: Not Asked   Other Topics Concern  . None   Social History Narrative   Additional Social History:  Allergies:   Allergies  Allergen Reactions  . Ivp Dye [Iodinated Diagnostic Agents] Anaphylaxis    Topical betadine is ok.  . Morphine And Related Anaphylaxis    Respiratory Arrest  . Shellfish Allergy Anaphylaxis  . Macrodantin [Nitrofurantoin] Nausea And Vomiting    And diarrhea  . Nubain [Nalbuphine Hcl] Other (See Comments)    BP "bottoms out"    Labs:  Results for orders placed or performed during the hospital encounter of 02/12/15 (from the past 48 hour(s))  CBC     Status: Abnormal   Collection Time: 02/17/15  7:21 AM  Result Value Ref Range   WBC 10.2 3.6 - 11.0 K/uL   RBC 3.36 (L) 3.80 - 5.20 MIL/uL   Hemoglobin 9.9 (L) 12.0 - 16.0 g/dL   HCT 30.8 (L) 35.0 - 47.0 %   MCV 91.7 80.0 - 100.0 fL   MCH 29.5 26.0 - 34.0 pg   MCHC 32.1 32.0 - 36.0 g/dL   RDW 15.1 (H) 11.5 - 14.5 %   Platelets 315 150 - 440 K/uL  Basic metabolic panel     Status: Abnormal   Collection Time: 02/17/15  7:21 AM  Result Value Ref Range   Sodium 141 135 - 145 mmol/L   Potassium 3.5 3.5 - 5.1 mmol/L   Chloride 107 101 - 111 mmol/L   CO2 26 22 - 32 mmol/L   Glucose, Bld 132  (H) 65 - 99 mg/dL   BUN 6 6 - 20 mg/dL   Creatinine, Ser 0.73 0.44 - 1.00 mg/dL   Calcium 7.9 (L) 8.9 - 10.3 mg/dL   GFR calc non Af Amer >60 >60 mL/min   GFR calc Af Amer >60 >60 mL/min    Comment: (NOTE) The eGFR has been calculated using the CKD EPI equation. This calculation has not been validated in all clinical situations. eGFR's persistently <60 mL/min signify possible Chronic Kidney Disease.    Anion gap 8 5 - 15  Magnesium     Status: Abnormal   Collection Time: 02/17/15  7:21 AM  Result Value Ref Range   Magnesium 1.6 (L) 1.7 - 2.4 mg/dL  Phosphorus     Status: None   Collection Time: 02/17/15  7:21 AM  Result Value Ref Range   Phosphorus 3.2 2.5 - 4.6 mg/dL    Vitals: Blood pressure 136/47, pulse 81, temperature 98.8 F (37.1 C), temperature source Oral, resp. rate 16, height 5' 3" (1.6 m), weight 100.88 kg (222 lb 6.4 oz), SpO2 92 %.  Risk to Self: Is patient at risk for suicide?: No Risk to Others:   Prior Inpatient Therapy:   Prior Outpatient Therapy:    Current Facility-Administered Medications  Medication Dose Route Frequency Provider Last Rate Last Dose  . acetaminophen (TYLENOL) tablet 650 mg  650 mg Oral Q6H PRN Dia Crawford III, MD       Or  . acetaminophen (TYLENOL) suppository 650 mg  650 mg Rectal Q6H PRN Dia Crawford III, MD      . allopurinol (ZYLOPRIM) tablet 100 mg  100 mg Oral q morning - 10a Dia Crawford III, MD   100 mg at 02/17/15 1040  . cefTRIAXone (ROCEPHIN) 2 g in dextrose 5 % 50 mL IVPB  2 g Intravenous Q24H Dia Crawford III, MD   2 g at 02/17/15 0207   And  . metroNIDAZOLE (FLAGYL) IVPB 500 mg  500 mg Intravenous Q8H Dia Crawford III, MD   500 mg at 02/17/15 7001  . dextrose  5 %-0.45 % sodium chloride infusion   Intravenous Continuous Sherri Rad, MD 75 mL/hr at 02/16/15 2259    . diphenhydrAMINE (BENADRYL) injection 12.5 mg  12.5 mg Intravenous Q6H PRN Clayburn Pert, MD       Or  . diphenhydrAMINE (BENADRYL) 12.5 MG/5ML elixir 12.5 mg  12.5 mg Oral  Q6H PRN Clayburn Pert, MD      . enoxaparin (LOVENOX) injection 40 mg  40 mg Subcutaneous Q24H Dia Crawford III, MD   40 mg at 02/17/15 1004  . feeding supplement (BOOST / RESOURCE BREEZE) liquid 1 Container  1 Container Oral TID BM Dia Crawford III, MD   1 Container at 02/17/15 1400  . fentaNYL (SUBLIMAZE) injection 12.5 mcg  12.5 mcg Intravenous Q2H PRN Clayburn Pert, MD   12.5 mcg at 02/16/15 0916  . levothyroxine (SYNTHROID, LEVOTHROID) tablet 75 mcg  75 mcg Oral QAC breakfast Dia Crawford III, MD   75 mcg at 02/17/15 [phone]  . metoprolol (LOPRESSOR) tablet 50 mg  50 mg Oral BID Dia Crawford III, MD   50 mg at 02/17/15 1040  . naloxone [facility]) injection 0.4 mg  0.4 mg Intravenous PRN Sherri Rad, MD       And  . sodium chloride 0.9 % injection 9 mL  9 mL Intravenous PRN Sherri Rad, MD      . ondansetron (ZOFRAN-ODT) disintegrating tablet 4 mg  4 mg Oral Q6H PRN Dia Crawford III, MD       Or  . ondansetron [facility]) injection 4 mg  4 mg Intravenous Q6H PRN Dia Crawford III, MD   4 mg at 02/17/15 3546  . ondansetron (ZOFRAN) injection 4 mg  4 mg Intravenous Q6H PRN Sherri Rad, MD      . oxyCODONE-acetaminophen (PERCOCET/ROXICET) 5-325 MG per tablet 1-2 tablet  1-2 tablet Oral Q4H PRN Clayburn Pert, MD   2 tablet at 02/17/15 1120  . pantoprazole (PROTONIX) injection 40 mg  40 mg Intravenous QHS Dia Crawford III, MD   40 mg at 02/16/15 2258  . promethazine (PHENERGAN) injection 25 mg  25 mg Intravenous Q6H PRN Clayburn Pert, MD   25 mg at 02/14/15 1835  . traMADol (ULTRAM) tablet 100 mg  100 mg Oral Q6H PRN Dia Crawford III, MD   100 mg at 02/16/15 1524    Musculoskeletal: Strength & Muscle Tone: decreased Gait & Station: unable to stand Patient leans: N/A  Psychiatric Specialty Exam: Physical Exam  Vitals reviewed. Constitutional: She appears well-developed and well-nourished. She has a sickly appearance.  HENT:  Head: Normocephalic and atraumatic.  Eyes: Conjunctivae are normal. Pupils are equal, round,  and reactive to light.  Neck: Normal range of motion.  Cardiovascular: Normal heart sounds.   Respiratory: Effort normal.  GI: Soft.    Musculoskeletal: Normal range of motion.  Neurological: She is alert.  Skin: Skin is warm and dry.  Psychiatric: Her speech is normal. Judgment and thought content normal. Her mood appears anxious. She is slowed. Cognition and memory are normal. She exhibits a depressed mood.  Patient appears to be somewhat slowed and dysphoric. Denies suicidal ideation. No evidence of psychosis. Cooperative with the interview.    Review of Systems  Constitutional: Positive for malaise/fatigue.  HENT: Negative.   Eyes: Negative.   Respiratory: Negative.   Cardiovascular: Negative.   Gastrointestinal: Positive for abdominal pain.  Musculoskeletal: Negative.   Skin: Negative.   Neurological: Positive for weakness.  Psychiatric/Behavioral: Positive for depression. Negative for suicidal ideas, hallucinations, memory loss  and substance abuse. The patient is nervous/anxious and has insomnia.     Blood pressure 136/47, pulse 81, temperature 98.8 F (37.1 C), temperature source Oral, resp. rate 16, height 5' 3" (1.6 m), weight 100.88 kg (222 lb 6.4 oz), SpO2 92 %.Body mass index is 39.41 kg/(m^2).  General Appearance: Casual  Eye Contact::  Fair  Speech:  Slow  Volume:  Decreased  Mood:  Dysphoric  Affect:  Congruent  Thought Process:  Goal Directed  Orientation:  Full (Time, Place, and Person)  Thought Content:  Negative  Suicidal Thoughts:  No  Homicidal Thoughts:  No  Memory:  Immediate;   Good Recent;   Good Remote;   Good  Judgement:  Fair  Insight:  Fair  Psychomotor Activity:  Decreased  Concentration:  Fair  Recall:  [employer] of Knowledge:Fair  Language: Fair  Akathisia:  No  Handed:  Right  AIMS (if indicated):     Assets:  Communication Skills Desire for Improvement Financial Resources/Insurance Housing Social Support  ADL's:  Intact    Cognition: WNL  Sleep:      Medical Decision Making: New problem, with additional work up planned, Review or order clinical lab tests (1), Review or order medicine tests (1) and Review of Medication Regimen & Side Effects (2)  Treatment Plan Summary: Plan 74 year old woman with depressive symptoms including yesterday making a suicidal like statements although he did not actually imply that she was planning to kill herself and she completely denies any plan to kill her self now. She appears to be very open and honest discussing her current situation. We talked about the difference between adjustment disorder type depression and major depression and the utility of possibly using medicine if it would be helpful. The patient states a preference to not use antidepressive medication. In the situation I think it's not clear that we have a major depression. She does have several depressive symptoms but we could see this as more of an adjustment disorder. Supportive counseling completed. We will agree to not start an antidepressive now with the patient was educated about signs of worsening mood problems. I will follow up with her in the hospital.  Plan:  Patient does not meet criteria for psychiatric inpatient admission. Supportive therapy provided about ongoing stressors. Disposition: No medicine for now. Follow-up as needed.  John Clapacs 02/17/2015 2:30 PM

## 2015-02-17 NOTE — Evaluation (Signed)
Physical Therapy Evaluation Patient Details Name: Martha Chung MRN: [id number] DOB: [birth year] Today's Date: 02/17/2015   History of Present Illness  Pt is a 74yo white female post-op colostomy takedown with partial colestomy. Pt reoprts this is her 7th abdominal surgery since January. She reports continued pain and a lack of self efficacy.   Clinical Impression  Pt is received semirecumbent in bed upon entry, awake, alert, and willing to participate. Pt reports nausea has improved since earlier, but pain remains about a 7/10 at rest, which worsens once mobility begins, pt becoming tearful. Pt reports she has not been up walking for almost a week. Pt reporting dizziness upon sitting which is worse with standing and does not improve with time. Pt SaO2 is taken at 92% while sitting on room air.  Pt strength as screened by functional mobility assessment reveals significant weakness, likely from combined prolonged supine positioning as well as pain inhibition. Patient presenting with impairment of strength, range of motion, pain, and activity tolerance, limiting ability to perform ADL and mobility tasks at  baseline level of function. Patient will benefit from skilled intervention to address the above impairments and limitations, in order to restore to prior level of function, improve patient safety upon discharge, and to decrease falls risk.       Follow Up Recommendations SNF    Equipment Recommendations  None recommended by PT    Recommendations for Other Services OT consult     Precautions / Restrictions Precautions Precautions: Fall Precaution Comments: Ileostomy bag, wound wac, other lines/leads  Restrictions Weight Bearing Restrictions: No      Mobility  Bed Mobility Overal bed mobility: Needs Assistance Bed Mobility: Supine to Sit     Supine to sit: Supervision     General bed mobility comments: multiple lines leads, requires additional time due to weakness, pain  inhibitiion, and dizziness with upright. Pt refuses physical assistance.   Transfers Overall transfer level: Needs assistance Equipment used: Rolling walker (2 wheeled) Transfers: Sit to/from Stand Sit to Stand: Min guard         General transfer comment: able to stand and remain upright for 3x 60 seconds. Dizziness worsening c prolonged standing. Pt given cues for upright posture and bringing head up.   Ambulation/Gait             General Gait Details: Unable due to pain and weakness. Pt able to take 5 steps to R, sidestepping c physical assistance to move walker + wound vac, in order to get to center of bed prior to return to supine.   Stairs            Wheelchair Mobility    Modified Rankin (Stroke Patients Only)       Balance Overall balance assessment: No apparent balance deficits (not formally assessed);Modified Independent   Sitting balance-Leahy Scale: Good     Standing balance support: Bilateral upper extremity supported Standing balance-Leahy Scale: Fair                               Pertinent Vitals/Pain Pain Assessment: 0-10 Pain Score: 8  Pain Location: surgical site/ abdomen.  Pain Intervention(s): Limited activity within patient's tolerance;Monitored during session;RN gave pain meds during session;Patient requesting pain meds-RN notified;Repositioned    Home Living Family/patient expects to be discharged to:: Private residence Living Arrangements: Alone Available Help at Discharge: Friend(s);Available PRN/intermittently Type of Home: Apartment Home Access: Stairs to enter Entrance Stairs-Rails: None  Entrance Stairs-Number of Steps: 1 Home Layout: One level Home Equipment: Walker - 4 wheels;Cane - single point      Prior Function Level of Independence: Independent (tolerating limited community distance ambulation and driving, indep in all ADL, IADL previously. )               Hand Dominance        Extremity/Trunk  Assessment   Upper Extremity Assessment: Generalized weakness           Lower Extremity Assessment: Generalized weakness      Cervical / Trunk Assessment:  (generalized weakness. )  Communication   Communication: No difficulties  Cognition Arousal/Alertness: Awake/alert Behavior During Therapy: WFL for tasks assessed/performed Overall Cognitive Status: Within Functional Limits for tasks assessed                      General Comments      Exercises        Assessment/Plan    PT Assessment Patient needs continued PT services  PT Diagnosis Abnormality of gait;Difficulty walking;Generalized weakness;Acute pain   PT Problem List Decreased strength;Decreased balance;Decreased mobility;Decreased activity tolerance;Decreased safety awareness  PT Treatment Interventions DME instruction;Gait training;Stair training;Functional mobility training;Therapeutic activities;Therapeutic exercise;Balance training;Patient/family education   PT Goals (Current goals can be found in the Care Plan section) Acute Rehab PT Goals Patient Stated Goal: Pt is extremely painful, nauseaus and discouraged, uninterested in goal setting at this time.  PT Goal Formulation: With patient Time For Goal Achievement: 03/03/15 Potential to Achieve Goals: Good    Frequency Min 2X/week   Barriers to discharge Inaccessible home environment      Co-evaluation               End of Session Equipment Utilized During Treatment: Gait belt Activity Tolerance: Patient tolerated treatment well Patient left: in bed;with bed alarm set;with call bell/phone within reach;with nursing/sitter in room Nurse Communication: Mobility status         Time: 4098-1191 PT Time Calculation (min) (ACUTE ONLY): 27 min   Charges:   PT Evaluation $Initial PT Evaluation Tier I: 1 Procedure PT Treatments $Therapeutic Activity: 8-22 mins   PT G Codes:        Shterna Laramee C February 22, 2015, 11:39 AM  11:43 AM   Rosamaria Lints, PT, DPT  License # [id number]

## 2015-02-17 NOTE — Care Management Important Message (Signed)
Important Message  Patient Details  Name: Martha Chung MRN: [id number] Date of Birth: March 06, 1941   Medicare Important Message Given:  Yes-third notification given    [name] 02/17/2015, 10:50 AM

## 2015-02-17 NOTE — Plan of Care (Signed)
Problem: Acute Rehab PT Goals(only PT should resolve) Goal: Pt Will Go Supine/Side To Sit Pt will demonstrate ModI bed mobility supine to sitting edge-of-bed to return to PLOF and to decrease caregiver burden.     Goal: Patient Will Transfer Sit To/From Stand Pt will transfer sit to/from-stand with RW at ModI without loss-of-balance to demonstrate good safety awareness for independent mobility in home.     Goal: Pt Will Ambulate Pt will ambulate with RW at Supervision using a step-through pattern and equal step length for a distances greater than 38ft to demonstrate the ability to perform safe household distance ambulation at discharge.

## 2015-02-17 NOTE — Progress Notes (Signed)
Patient ID: Martha Chung, female   DOB: 11-01-1940, 74 y.o.   MRN: [id number]   Surgery  POD 3  VAC changed today by Dr Tonita Cong  Overall she feels better, less pain, some nausea  Less soilage via vagina,  Vitals stable  Ostomy 300-400 per shift  CBC Latest Ref Rng 02/17/2015 02/15/2015 02/13/2015  WBC 3.6 - 11.0 K/uL 10.2 14.7(H) 9.8  Hemoglobin 12.0 - 16.0 g/dL 4.0(J) 10.4(L) 9.2(L)  Hematocrit 35.0 - 47.0 % 30.8(L) 32.8(L) 29.0(L)  Platelets 150 - 440 K/uL 315 338 279      BMP Latest Ref Rng 02/17/2015 02/15/2015 02/13/2015  Glucose 65 - 99 mg/dL 811(B) 147(W) 295(A)  BUN 6 - 20 mg/dL Creatinine 0.44 - 1.00 mg/dL 2.13 0.86 5.78  Sodium 135 - 145 mmol/L 141 142 139  Potassium 3.5 - 5.1 mmol/L 3.5 4.3 3.3(L)  Chloride 101 - 111 mmol/L 107 110 105  CO2 22 - 32 mmol/L Calcium 8.9 - 10.3 mg/dL 7.9(L) 8.0(L) 7.5(L)    IMP:  Making slow progress  Plan:  Appreciate psych input, will need foley out in am, dispo planning.

## 2015-02-17 NOTE — Progress Notes (Signed)
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 She reports feeling somewhat better today but is having some nausea she thinks secondary to being hungry. Pain is much better controlled with PO pain meds. She has had continue output from her ostomy and one additional output from her rectum since yesterday. She has not ambulated and has not been out of bed today.  Objective: Vital signs in last 24 hours: Temp:  [97.9 F (36.6 C)-98.8 F (37.1 C)] 98.8 F (37.1 C) (08/18 0748) Pulse Rate:  [65-74] 69 (08/18 0748) Resp:  [16] 16 (08/18 0748) BP: (122-141)/(42-47) 136/47 mmHg (08/18 0748) SpO2:  [91 %-96 %] 96 % (08/18 0748) Last BM Date:  (ostomy)  Intake/Output from previous day: 08/17 0701 - 08/18 0700 In: 2200.4 [P.O.:240; I.V.:1960.4] Out: 2225 [Urine:1675; Stool:550] Intake/Output this shift:    Physical exam:  GEN: NAD CHEST: CTA AND RRR ABD: SOFT, appropriately TTP along incision sites, no rebound and no evidence of peritonitis. Ileostomy present in RLQ that is pink and productive of stool. Midline with incisional wound vac in place  Lab Results: CBC   Recent Labs  02/15/15 0426 02/17/15 0721  WBC 14.7* 10.2  HGB 10.4* 9.9*  HCT 32.8* 30.8*  PLT 338 315   BMET  Recent Labs  02/15/15 0426 02/17/15 0721  NA 142 141  K 4.3 3.5  CL 110 107  CO2 25 26  GLUCOSE 213* 132*  BUN 7 6  CREATININE 0.85 0.73  CALCIUM 8.0* 7.9*   PT/INR No results for input(s): LABPROT, INR in the last 72 hours. ABG No results for input(s): PHART, HCO3 in the last 72 hours.  Invalid input(s): PCO2, PO2  Studies/Results: Dg Chest Port 1 View  02/15/2015   CLINICAL DATA:  Adjustment of right upper extremity PICC placed at bedside earlier today.  EXAM: PORTABLE CHEST - 1 VIEW 1731 hr:  COMPARISON:  Portable chest x-ray earlier same date 1545 hr and earlier.  FINDINGS: Right arm PICC tip now projects over the mid  SVC. Nasogastric tube courses below the diaphragm into the stomach.  Markedly suboptimal inspiration due to body habitus with atelectasis at the right lung base, unchanged. No new pulmonary parenchymal abnormalities elsewhere since earlier same date.  IMPRESSION: 1. Right arm PICC tip now projects over the mid SVC. 2. Suboptimal inspiration with atelectasis at the right lung base, unchanged since earlier in the day. No new abnormalities.   Electronically Signed   By: Hulan Saas M.D.   On: 02/15/2015 18:00   Dg Chest Port 1 View  02/15/2015   CLINICAL DATA:  PICC line placement  EXAM: PORTABLE CHEST - 1 VIEW  COMPARISON:  07/11/2014 and older studies  FINDINGS: Right PICC line has been placed with tip about 3.3 cm into the right atrium. NG tube is noted over the stomach but tip is not visible. No pneumothorax. Limited inspiratory effect. Left perihilar and right lower lobe opacities. 2cm sclerotic lesion proximal right humerus unchanged from  IMPRESSION: Bilateral parenchymal opacities could be due to atelectasis or pneumonia.  Right PICC line as described above tip 3.3 cm into the right atrium.  No pneumothorax.  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   Electronically Signed   By: Esperanza Heir M.D.   On: 02/15/2015 16:25  Anti-infectives: Anti-infectives    Start     Dose/Rate Route Frequency Ordered Stop   02/12/15 1000  cefTRIAXone (ROCEPHIN) 2 g in dextrose 5 % 50 mL IVPB     2 g 100 mL/hr over 30 Minutes Intravenous Every 24 hours 02/12/15 0945     02/12/15 1000  metroNIDAZOLE (FLAGYL) IVPB 500 mg     500 mg 100 mL/hr over 60 Minutes Intravenous Every 8 hours 02/12/15 0945        Assessment/Plan:  74 year old female POD#3 s/p loop ileostomy Improving clinically. Will replace electrolytes,  encourage PO, encourage ambulation Plan to remove wound vac today On ABX for treatment of distal anastomotic leak that lead to colovaginal fistula day 5 of 10. Continue lovenox for DVT prophylaxis  Yama Nielson T. Tonita Cong, MD, FACS  02/17/2015

## 2015-02-18 MED ORDER — METOCLOPRAMIDE HCL 5 MG/ML IJ SOLN
10.0000 mg | Freq: Three times a day (TID) | INTRAMUSCULAR | Status: DC | PRN
  Administered 2015-02-18 – 2015-02-26 (×5): 10 mg via INTRAVENOUS
  Filled 2015-02-18 (×5): qty 2

## 2015-02-18 NOTE — Progress Notes (Signed)
Physical Therapy Treatment Patient Details Name: Martha Chung MRN: [id number] DOB: 30-Jun-1941 Today's Date: 02/18/2015    History of Present Illness Pt is a 74yo white female post-op colostomy takedown with partial colestomy. Pt reoprts this is her 7th abdominal surgery since January. She reports continued pain and a lack of self efficacy.     PT Comments    Pt tolerating treatment session with significant increases in pain, RN notified mid session. Pt is not motivated to participate, but is agreeable and able to complete entire PT sesssion as planned with moderate encouragement. Pt continues to make progress toward goals as evidenced by improved tolerance to standing and ability to transfer to chair. Pt's greatest limitation continues to be severe pain c mobility which continues to limit ability to perform all mobility tasks at baseline function. Patient presenting with impairment of strength, pain, range of motion, balance, and activity tolerance, limiting ability to perform ADL and mobility tasks at  baseline level of function. Patient will benefit from skilled intervention to address the above impairments and limitations, in order to restore to prior level of function, improve patient safety upon discharge, and to decrease caregiver burden.    Follow Up Recommendations  SNF     Equipment Recommendations  None recommended by PT    Recommendations for Other Services OT consult     Precautions / Restrictions Precautions Precautions: Fall Precaution Comments: Ileostomy bag Restrictions Weight Bearing Restrictions: No    Mobility  Bed Mobility Overal bed mobility: Needs Assistance Bed Mobility: Supine to Sit     Supine to sit: Supervision     General bed mobility comments: Foley and wound vac removed yesterday, able to perform slowly and quite painfully, requiring additional time to perform.   Transfers Overall transfer level: Needs assistance Equipment used: Rolling  walker (2 wheeled) Transfers: Sit to/from Stand Sit to Stand: Min guard         General transfer comment: Pt reports continued visual disturbance, which she attributes to 'chronic non-diabetic low blood sugar,' since she reports not having eaten in 6 days. Pt able to stand 5x for 60+ seconds, c single UE support while she holds painful surgical site.   Ambulation/Gait Ambulation/Gait assistance: Min guard Ambulation Distance (Feet): 3 Feet (bed to chair. ) Assistive device: Rolling walker (2 wheeled) Gait Pattern/deviations: Ataxic;Trunk flexed   Gait velocity interpretation: <1.8 ft/sec, indicative of risk for recurrent falls General Gait Details: Very painful c mobility. Slow and guarded.    Stairs            Wheelchair Mobility    Modified Rankin (Stroke Patients Only)       Balance   Sitting-balance support: No upper extremity supported;Feet supported Sitting balance-Leahy Scale: Good     Standing balance support: Single extremity supported Standing balance-Leahy Scale: Fair                      Cognition Arousal/Alertness: Awake/alert Behavior During Therapy: WFL for tasks assessed/performed;Flat affect Overall Cognitive Status: Within Functional Limits for tasks assessed                      Exercises      General Comments        Pertinent Vitals/Pain Pain Assessment: Faces Faces Pain Scale: Hurts whole lot (Pt reports 0/10 pain at rest, but upon standing is immediately tearful and crying, holding surgical site. ) Pain Location: LUQ, surgical site  Pain Intervention(s): Limited activity within  patient's tolerance;Monitored during session;RN gave pain meds during session;Patient requesting pain meds-RN notified;Utilized relaxation techniques    Home Living                      Prior Function            PT Goals (current goals can now be found in the care plan section) Acute Rehab PT Goals Patient Stated Goal: Pt  continues to remain unconcerned c goal setting, and using language that reveals feelings of powerlessness and sadness.  PT Goal Formulation: With patient Time For Goal Achievement: 03/03/15 Potential to Achieve Goals: Good Progress towards PT goals: Progressing toward goals    Frequency  Min 2X/week    PT Plan Current plan remains appropriate    Co-evaluation             End of Session Equipment Utilized During Treatment: Gait belt Activity Tolerance: Patient tolerated treatment well;Patient limited by pain;Patient limited by fatigue Patient left: with nursing/sitter in room;in chair;with call bell/phone within reach;with chair alarm set     Time: 1610-9604 PT Time Calculation (min) (ACUTE ONLY): 33 min  Charges:  $Therapeutic Activity: 23-37 mins                    G Codes:      Kadyn Chovan C 19-Feb-2015, 11:32 AM  11:34 AM  Rosamaria Lints, PT, DPT [place] License # [id number]

## 2015-02-18 NOTE — Progress Notes (Signed)
4 Days Post-Op   Subjective: 74 y/o female POD#4 from diverting loop ileostomy for colovaginal fistula. Had nausea overnight that resolved with phenergan but also caused restlessness and slurred speech. Patient does not fully remember events. She has otherwise been tolerating PO. Had vac removed yesterday and ostomy appliance changed with nursing staff. Participated with PT yesterday and has been up to a chair. She has no complaints this AM.  Vital signs in last 24 hours: Temp:  [97.9 F (36.6 C)-98.1 F (36.7 C)] 98.1 F (36.7 C) (08/19 0816) Pulse Rate:  [63-81] 74 (08/19 0816) Resp:  [16-17] 16 (08/19 0816) BP: (118-152)/(48-70) 152/70 mmHg (08/19 0816) SpO2:  [92 %-98 %] 94 % (08/19 0816) Last BM Date:  (ostomy)  Intake/Output from previous day: 08/18 0701 - 08/19 0700 In: 1785.7 [I.V.:1785.7] Out: 1965 [Urine:1800; Stool:165]  PE: Gen: Alert and oriented, no prolonged effects from the night Chest: CTA and RRR GI: ABD is soft, appropriately TTP along incision sites. Midline staples and retention sutures in place. Lower 4 cm of wound left open from surgery with good granulation tissue. Loop ostomy pink and productive in RLQ. Bowel sounds present in all quadrants  Lab Results:  CBC  Recent Labs  02/17/15 0721  WBC 10.2  HGB 9.9*  HCT 30.8*  PLT 315   CMP     Component Value Date/Time   NA 141 02/17/2015 0721   NA 136 07/19/2014 0243   K 3.5 02/17/2015 0721   K 4.5 07/19/2014 0243   CL 107 02/17/2015 0721   CL 104 07/19/2014 0243   CO2 26 02/17/2015 0721   CO2 27 07/19/2014 0243   GLUCOSE 132* 02/17/2015 0721   GLUCOSE 126* 07/19/2014 0243   BUN 6 02/17/2015 0721   BUN 22* 07/19/2014 0243   CREATININE 0.73 02/17/2015 0721   CREATININE 0.74 07/19/2014 0243   CALCIUM 7.9* 02/17/2015 0721   CALCIUM 8.5 07/19/2014 0243   PROT 5.7* 07/12/2014 0404   ALBUMIN 1.4* 07/12/2014 0404   AST 26 07/12/2014 0404   ALT 43 07/12/2014 0404   ALKPHOS 135* 07/12/2014 0404   BILITOT 0.4 07/12/2014 0404   GFRNONAA >60 02/17/2015 0721   GFRNONAA >60 07/19/2014 0243   GFRAA >60 02/17/2015 0721   GFRAA >60 07/19/2014 0243   PT/INR No results for input(s): LABPROT, INR in the last 72 hours.  Studies/Results: No results found.  Assessment/Plan: 74 year old female POD#4 from a diverting loop ileostomy. Slowly progressing Will discontinue phenergan and replace with reglan for breakthrough nausea Will remove foley this AM Encourage ambulation Will continue IVF until better tolerating PO On ABX for treatment of anastomotic leak (Day 6 of 10) Continue Lovenox for DVT prophylaxis  Ricarda Frame, MD [facility] General Surgeon [facility] Surgical  02/18/2015

## 2015-02-18 NOTE — Consult Note (Signed)
WOC ostomy follow up Stoma type/location: RLQ Ileostomy Stomal assessment/size: 1 1/4" round, pink moist and well budded Peristomal assessment: Intact.  Pouch had leaked and was changed earlier.  Patient anticipating discharge, is a retired Engineer, civil (consulting) and I have worked with her before on her colostomy teaching.  Instructed to continue using barrier ring for extra protection and increased wear time.  Treatment options for stomal/peristomal skin: barrier ring Output some bloody liquid in pouch Ostomy pouching: 2pc. 2 1/4 pouch with barrier ring  Education provided: reviewed basics of ileostomy care, use of barrier ring and difference in pouch stoma effluent including potential to irritate skin if leaking occurs.  Verbalizes understanding but is very nauseous at this time. Supplies packed for patient to take with her.  Enrolled patient in [place] Secure Start Discharge program: No Will not follow at this time.  Please re-consult if needed.  Maple Hudson RN BSN [facility]ON Pager [phone]

## 2015-02-18 NOTE — Progress Notes (Signed)
Nutrition Follow-up    INTERVENTION:  Meals and snacks: Cater to pt preferences, discussed options with pt, ordered lunch for pt (willing to try mashed potatoes and gravy only.  Encouraged small frequent meals Nutrition Supplement Therapy: Will discontinue boost breeze TID per pt request and add magic cup BID (each supplement provides 290 kcal and 9 grams of protein)    NUTRITION DIAGNOSIS:   Inadequate oral intake related to altered GI function as evidenced by NPO status, on solid foods and supplements    GOAL:   Patient will meet greater than or equal to 90% of their needs    MONITOR:    (Energy  intake, digestive system, Electrolyte and renal profile)  REASON FOR ASSESSMENT:   Malnutrition Screening Tool    ASSESSMENT:      Pt s/p diverting loop ileostomy for colovaginal fistula.  Wound vac removed.   Current Nutrition: reports ate 2 crackers this am and appetite is decreased.  Reports she wants to eat to get better.  Wants boost breeze discontinued and agreeable to magic cup for added nutrition   Gastrointestinal Profile: Last BM: ostomy last 24 hr per I and O sheet   Medications: D5 1/2 NS at 37ml/hr, reglan, protonix  Electrolyte/Renal Profile and Glucose Profile:   Recent Labs Lab 02/13/15 0645 02/15/15 0426 02/17/15 0721  NA 139 142 141  K 3.3* 4.3 3.5  CL 105 110 107  CO2 25 25 26   BUN 10 7 6   CREATININE 0.79 0.85 0.73  CALCIUM 7.5* 8.0* 7.9*  MG  --   --  1.6*  PHOS  --   --  3.2  GLUCOSE 123* 213* 132*     Weight Trend since Admission: Filed Weights   02/12/15 0845 02/12/15 1216  Weight: 225 lb (102.059 kg) 222 lb 6.4 oz (100.88 kg)      Diet Order:  Diet regular Room service appropriate?: Yes; Fluid consistency:: Thin  Skin:   reviewed   Height:   Ht Readings from Last 1 Encounters:  02/12/15 5\' 3"  (1.6 m)    Weight:   Wt Readings from Last 1 Encounters:  02/12/15 222 lb 6.4 oz (100.88 kg)    BMI:  Body mass  index is 39.41 kg/(m^2).  Estimated Nutritional Needs:   Kcal:  BEE 984 kcals (IF 1.2-1.5, AF 1.3) 0109-3235 kcals/d  Protein:  (1.2-1.5 g/kg) 62-78 g/d  Fluid:  (30-32ml/kg) 1560-1846ml/d  EDUCATION NEEDS:   No education needs identified at this time  MODERATE Care Level  Shantal Roan B. Freida Busman, RD, LDN [phone] (pager)

## 2015-02-18 NOTE — Progress Notes (Signed)
Pt noted with slurred speech and restlessness after PRN phenergan 25mg  IV was administered at 2231. However medication was effective. Will continue to monitor.

## 2015-02-18 NOTE — Consult Note (Signed)
  Psychiatry: Patient appears to be stable. I came by to check up on her and she was on the phone. I will try and follow up over the weekend.

## 2015-02-18 NOTE — Care Management (Signed)
Discharge plan is now STR. CSW is following.

## 2015-02-19 LAB — COMPREHENSIVE METABOLIC PANEL
ALT: 7 U/L — AB (ref 14–54)
ANION GAP: 7 (ref 5–15)
AST: 13 U/L — ABNORMAL LOW (ref 15–41)
Albumin: 2.5 g/dL — ABNORMAL LOW (ref 3.5–5.0)
Alkaline Phosphatase: 65 U/L (ref 38–126)
BUN: <5 mg/dL — ABNORMAL LOW (ref 6–20)
CHLORIDE: 107 mmol/L (ref 101–111)
CO2: 27 mmol/L (ref 22–32)
CREATININE: 0.82 mg/dL (ref 0.44–1.00)
Calcium: 8.1 mg/dL — ABNORMAL LOW (ref 8.9–10.3)
Glucose, Bld: 147 mg/dL — ABNORMAL HIGH (ref 65–99)
POTASSIUM: 3.4 mmol/L — AB (ref 3.5–5.1)
SODIUM: 141 mmol/L (ref 135–145)
Total Bilirubin: 0.3 mg/dL (ref 0.3–1.2)
Total Protein: 6.1 g/dL — ABNORMAL LOW (ref 6.5–8.1)

## 2015-02-19 LAB — CBC WITH DIFFERENTIAL/PLATELET
Basophils Absolute: 0 10*3/uL (ref 0–0.1)
Basophils Relative: 0 %
EOS ABS: 0.5 10*3/uL (ref 0–0.7)
EOS PCT: 4 %
HCT: 34.3 % — ABNORMAL LOW (ref 35.0–47.0)
Hemoglobin: 11 g/dL — ABNORMAL LOW (ref 12.0–16.0)
LYMPHS ABS: 1 10*3/uL (ref 1.0–3.6)
Lymphocytes Relative: 9 %
MCH: 29.1 pg (ref 26.0–34.0)
MCHC: 32.1 g/dL (ref 32.0–36.0)
MCV: 90.9 fL (ref 80.0–100.0)
MONO ABS: 0.8 10*3/uL (ref 0.2–0.9)
Monocytes Relative: 7 %
Neutro Abs: 9.4 10*3/uL — ABNORMAL HIGH (ref 1.4–6.5)
Neutrophils Relative %: 80 %
PLATELETS: 340 10*3/uL (ref 150–440)
RBC: 3.77 MIL/uL — AB (ref 3.80–5.20)
RDW: 15.2 % — AB (ref 11.5–14.5)
WBC: 11.9 10*3/uL — AB (ref 3.6–11.0)

## 2015-02-19 LAB — MAGNESIUM: MAGNESIUM: 1.6 mg/dL — AB (ref 1.7–2.4)

## 2015-02-19 LAB — PHOSPHORUS: PHOSPHORUS: 3.1 mg/dL (ref 2.5–4.6)

## 2015-02-19 MED ORDER — POTASSIUM CHLORIDE CRYS ER 20 MEQ PO TBCR
20.0000 meq | EXTENDED_RELEASE_TABLET | Freq: Once | ORAL | Status: AC
  Administered 2015-02-19: 20 meq via ORAL
  Filled 2015-02-19: qty 1

## 2015-02-19 NOTE — Clinical Social Work Placement (Signed)
   CLINICAL SOCIAL WORK PLACEMENT  NOTE  Date:  02/19/2015  Patient Details  Name: COLINA ZEPPIERI MRN: [id number] Date of Birth: 1941/02/10  Clinical Social Work is seeking post-discharge placement for this patient at the Skilled  Nursing Facility level of care (*CSW will initial, date and re-position this form in  chart as items are completed):  Yes   Patient/family provided with [facility] Clinical Social Work Department's list of facilities offering this level of care within the geographic area requested by the patient (or if unable, by the patient's family).  Yes   Patient/family informed of their freedom to choose among providers that offer the needed level of care, that participate in Medicare, Medicaid or managed care program needed by the patient, have an available bed and are willing to accept the patient.  Yes   Patient/family informed of [facility]'s ownership interest in [facility] and [facility], as well as of the fact that they are under no obligation to receive care at these facilities.  PASRR submitted to EDS on       PASRR number received on       Existing PASRR number confirmed on 02/18/15     FL2 transmitted to all facilities in geographic area requested by pt/family on 02/18/15     FL2 transmitted to all facilities within larger geographic area on       Patient informed that his/her managed care company has contracts with or will negotiate with certain facilities, including the following:   ([employer] preauth started by [employer])     Yes   Patient/family informed of bed offers received.  Patient chooses bed at [facility]     Physician recommends and patient chooses bed at      Patient to be transferred to [facility] on  .  Patient to be transferred to facility by       Patient family notified on   of transfer.  Name of family member notified:        PHYSICIAN Please sign FL2, Please prepare priority discharge summary, including  medications, Please prepare prescriptions     Additional Comment:    _______________________________________________ Ned Card, LCSW 02/19/2015, 1:05 PM

## 2015-02-19 NOTE — Progress Notes (Signed)
Spoke with Dr Juliann Pulse concerning pt home medication Lopressor and BP. OK for patient to take tonight.

## 2015-02-19 NOTE — Clinical Social Work Note (Signed)
Late entry 8/20/016 for 02/18/15 Clinical Social Work Assessment  Patient Details  Name: Martha Chung MRN: [id number] Date of Birth: Dec 16, 1940  Date of referral:  02/17/15               Reason for consult:  Facility Placement, Mental Health Concerns                Permission sought to share information with:  Facility Art therapist granted to share information::  Yes, Verbal Permission Granted  Name::        Agency::  SNFs  Relationship::     Contact Information:     Housing/Transportation Living arrangements for the past 2 months:  [place] of Information:  Patient, Medical Team, Case Manager Patient Interpreter Needed:  None Criminal Activity/Legal Involvement Pertinent to Current Situation/Hospitalization:  No - Comment as needed Significant Relationships:  [place] Lives with:  Self Do you feel safe going back to the place where you live?  No Need for family participation in patient care:  No (Coment)  Care giving concerns:  Pt lives alone. Has 2 sons outside of state. Pt not close to local grandchildren per psych note.    Social Worker assessment / plan:  CSW met with Pt in room only briefly due to extreme nausea. 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 Pt is aware of dc recommendation to SNF Pt has been to [facility] before and would like a different facility. Pt's insurance requires preauthorization which can take 1-2 days. Pt's information was sent out and Pt has selected [facility]. Insurance auth started by [employer] on Friday, likely received on Monday.    Employment status:  Retired Nurse, adult PT Recommendations:  [place] / Referral to community resources:  [place]  Patient/Family's Response to care:  Pt struggling with current medical situation,  made worse by today's nausea. Pt depressed affect but engaging with CSW and medical team.   Patient/Family's Understanding of and Emotional Response to Diagnosis, Current Treatment, and Prognosis:  Pt with good understanding of medical situation due to her back ground in nursing. Pt having difficulty adjusting to medical situation per psych note. CSW did not discuss with patient due to her discomfort at this time.   Emotional Assessment Appearance:  Appears stated age Attitude/Demeanor/Rapport:  Guarded, Grieving Affect (typically observed):  Withdrawn, Sad, Depressed Orientation:  Oriented to Self, Oriented to Place, Oriented to  Time, Oriented to Situation Alcohol / Substance use:  Never Used Psych involvement (Current and /or in the community):  Yes (Comment)  Discharge Needs  Concerns to be addressed:  Adjustment to Illness, Discharge Planning Concerns, Mental Health Concerns Readmission within the last 30 days:  No Current discharge risk:  Dependent with Mobility, Lives alone Barriers to Discharge:  Continued Medical Work up   [employer], LCSW 02/19/2015, 8:41 AM

## 2015-02-19 NOTE — Progress Notes (Signed)
5 Days Post-Op   Subjective:  74 year old female 5 days status post loop ileostomy, diversion for colovaginal fistula. Patient reports that her persistent nausea has improved overnight with the addition of Reglan. She states that her appetite has not yet returned. Denies any fevers or chills or any current nausea. She has continued to have output from her rectum and vagina, however this appears to be much less than prior. There was noted to be some leakage from her off the appliance and her wound yesterday, however this is improved with addition of August pace consultation from the ostomy nurse. She states that her energy is returning, however slowly.  Vital signs in last 24 hours: Temp:  [97.9 F (36.6 C)-98.3 F (36.8 C)] 98 F (36.7 C) (08/20 0759) Pulse Rate:  [64-81] 72 (08/20 0759) Resp:  [16-20] 16 (08/20 0759) BP: (128-155)/(42-77) 128/42 mmHg (08/20 0759) SpO2:  [94 %-97 %] 97 % (08/20 0759) Last BM Date:  (ostomy)  Intake/Output from previous day: 08/19 0701 - 08/20 0700 In: 1796 [I.V.:1456; IV Piggyback:340] Out: 3150 [Urine:2750; Stool:400]  Gen.: No acute distress Chest: Clear to auscultation regular rate and rhythm GI: Abdomen is soft, appropriately tender to palpation around ostomy and midline incision sites. Ileostomy present in the right lower quadrant is pink and productive of liquid stool. Midline dressing clean dry and intact, retention sutures present below this with a intact staple line. Distal recent [place] lower open with healthy-appearing granulation tissue. No evidence of spreading infection from this incision.  Lab Results:  CBC  Recent Labs  02/17/15 0721 02/19/15 0710  WBC 10.2 11.9*  HGB 9.9* 11.0*  HCT 30.8* 34.3*  PLT 315 340   CMP     Component Value Date/Time   NA 141 02/19/2015 0710   NA 136 07/19/2014 0243   K 3.4* 02/19/2015 0710   K 4.5 07/19/2014 0243   CL 107 02/19/2015 0710   CL 104 07/19/2014 0243   CO2 27 02/19/2015 0710   CO2 27  07/19/2014 0243   GLUCOSE 147* 02/19/2015 0710   GLUCOSE 126* 07/19/2014 0243   BUN <5* 02/19/2015 0710   BUN 22* 07/19/2014 0243   CREATININE 0.82 02/19/2015 0710   CREATININE 0.74 07/19/2014 0243   CALCIUM 8.1* 02/19/2015 0710   CALCIUM 8.5 07/19/2014 0243   PROT 6.1* 02/19/2015 0710   PROT 5.7* 07/12/2014 0404   ALBUMIN 2.5* 02/19/2015 0710   ALBUMIN 1.4* 07/12/2014 0404   AST 13* 02/19/2015 0710   AST 26 07/12/2014 0404   ALT 7* 02/19/2015 0710   ALT 43 07/12/2014 0404   ALKPHOS 65 02/19/2015 0710   ALKPHOS 135* 07/12/2014 0404   BILITOT 0.3 02/19/2015 0710   BILITOT 0.4 07/12/2014 0404   GFRNONAA >60 02/19/2015 0710   GFRNONAA >60 07/19/2014 0243   GFRAA >60 02/19/2015 0710   GFRAA >60 07/19/2014 0243   PT/INR No results for input(s): LABPROT, INR in the last 72 hours.  Studies/Results: No results found.  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 Patient has had persistent nausea that appears to be improved with Reglan, will continue Reglan. Patient states her appetite has yet to return so we will continue IV fluids for hydration support, encourage by mouth intake. Continue to encourage incentive spirometry and ambulation. Continue Lovenox for DVT prophylaxis. We'll replace electrolytes today. Continues on antibiotics for treatment of anastomotic leak, abscess. Day 7 of 10. White blood cell test count is mildly elevated today, we'll continue  to follow with repeat labs tomorrow.  Ricarda Frame, MD [facility] General Surgeon [facility] Surgical 02/19/2015

## 2015-02-20 ENCOUNTER — Inpatient Hospital Stay: Payer: Medicare HMO

## 2015-02-20 LAB — BASIC METABOLIC PANEL
ANION GAP: 8 (ref 5–15)
BUN: 6 mg/dL (ref 6–20)
CALCIUM: 8 mg/dL — AB (ref 8.9–10.3)
CO2: 25 mmol/L (ref 22–32)
Chloride: 108 mmol/L (ref 101–111)
Creatinine, Ser: 0.76 mg/dL (ref 0.44–1.00)
GFR calc non Af Amer: >60 mL/min (ref >60)
GLUCOSE: 126 mg/dL — AB (ref 65–99)
POTASSIUM: 3.6 mmol/L (ref 3.5–5.1)
Sodium: 141 mmol/L (ref 135–145)

## 2015-02-20 LAB — CBC WITH DIFFERENTIAL/PLATELET
BASOS ABS: 0.1 10*3/uL (ref 0–0.1)
BASOS PCT: 1 %
Eosinophils Absolute: 0.5 10*3/uL (ref 0–0.7)
Eosinophils Relative: 4 %
HEMATOCRIT: 33.8 % — AB (ref 35.0–47.0)
HEMOGLOBIN: 10.6 g/dL — AB (ref 12.0–16.0)
LYMPHS PCT: 13 %
Lymphs Abs: 1.7 10*3/uL (ref 1.0–3.6)
MCH: 28.7 pg (ref 26.0–34.0)
MCHC: 31.5 g/dL — ABNORMAL LOW (ref 32.0–36.0)
MCV: 91.3 fL (ref 80.0–100.0)
Monocytes Absolute: 1.1 10*3/uL — ABNORMAL HIGH (ref 0.2–0.9)
Monocytes Relative: 8 %
NEUTROS ABS: 9.8 10*3/uL — AB (ref 1.4–6.5)
NEUTROS PCT: 74 %
Platelets: 316 10*3/uL (ref 150–440)
RBC: 3.7 MIL/uL — AB (ref 3.80–5.20)
RDW: 15.4 % — AB (ref 11.5–14.5)
WBC: 13.2 10*3/uL — AB (ref 3.6–11.0)

## 2015-02-20 LAB — URINALYSIS COMPLETE WITH MICROSCOPIC (ARMC ONLY)
BILIRUBIN URINE: NEGATIVE
Glucose, UA: NEGATIVE mg/dL
KETONES UR: NEGATIVE mg/dL
NITRITE: NEGATIVE
Protein, ur: NEGATIVE mg/dL
SPECIFIC GRAVITY, URINE: 1.014 (ref 1.005–1.030)
pH: 7 (ref 5.0–8.0)

## 2015-02-20 LAB — PHOSPHORUS: PHOSPHORUS: 3.2 mg/dL (ref 2.5–4.6)

## 2015-02-20 LAB — MAGNESIUM: Magnesium: 1.6 mg/dL — ABNORMAL LOW (ref 1.7–2.4)

## 2015-02-20 MED ORDER — CIPROFLOXACIN IN D5W 400 MG/200ML IV SOLN
400.0000 mg | Freq: Two times a day (BID) | INTRAVENOUS | Status: DC
  Administered 2015-02-20 – 2015-03-01 (×18): 400 mg via INTRAVENOUS
  Filled 2015-02-20 (×20): qty 200

## 2015-02-20 NOTE — Progress Notes (Signed)
6 Days Post-Op   Subjective:  74 year old female postoperative day #6 status post loop ileostomy creation for colovaginal fistula. Patient reports feeling better today than yesterday, however pain medication for headache again cause nausea which was relieved with Reglan. She reports increased appetite yesterday, however she did not like her dinner due to the taste not for lack of appetite. She denies any current nausea she has not had any vomiting. She denies any fevers chills shortness of breath chest pain or burning with urination. She has had some continued output from her rectum and her vagina, however this was minimal overnight. She continues to have ileostomy output.  Vital signs in last 24 hours: Temp:  [97.6 F (36.4 C)-98.6 F (37 C)] 97.6 F (36.4 C) (08/21 0812) Pulse Rate:  [65-72] 72 (08/21 0812) Resp:  [16-18] 18 (08/21 0812) BP: (114-142)/(40-54) 138/40 mmHg (08/21 0812) SpO2:  [95 %-98 %] 97 % (08/21 0812) Last BM Date:  (ostomy)  Intake/Output from previous day: 08/20 0701 - 08/21 0700 In: 2860.8 [P.O.:720; I.V.:1995.8; IV Piggyback:145] Out: 2965 [Urine:1825; Stool:1140]  Gen.: No acute distress Chest: Regular rate and rhythm and clear to auscultation. GI: Abdomen is soft and minimally tender around her incision sites and loop ileostomy site. She is nondistended without any evidence of wound infection or peritonitis.Ileostomy present in the right lower quadrant is pink and patent and productive of stool. distal portion of wound remains open with good granulation tissue and no evidence of infection on exam.  Lab Results:  CBC  Recent Labs  02/19/15 0710 02/20/15 0559  WBC 11.9* 13.2*  HGB 11.0* 10.6*  HCT 34.3* 33.8*  PLT 340 316   CMP     Component Value Date/Time   NA 141 02/20/2015 0559   NA 136 07/19/2014 0243   K 3.6 02/20/2015 0559   K 4.5 07/19/2014 0243   CL 108 02/20/2015 0559   CL 104 07/19/2014 0243   CO2 25 02/20/2015 0559   CO2 27  07/19/2014 0243   GLUCOSE 126* 02/20/2015 0559   GLUCOSE 126* 07/19/2014 0243   BUN 6 02/20/2015 0559   BUN 22* 07/19/2014 0243   CREATININE 0.76 02/20/2015 0559   CREATININE 0.74 07/19/2014 0243   CALCIUM 8.0* 02/20/2015 0559   CALCIUM 8.5 07/19/2014 0243   PROT 6.1* 02/19/2015 0710   PROT 5.7* 07/12/2014 0404   ALBUMIN 2.5* 02/19/2015 0710   ALBUMIN 1.4* 07/12/2014 0404   AST 13* 02/19/2015 0710   AST 26 07/12/2014 0404   ALT 7* 02/19/2015 0710   ALT 43 07/12/2014 0404   ALKPHOS 65 02/19/2015 0710   ALKPHOS 135* 07/12/2014 0404   BILITOT 0.3 02/19/2015 0710   BILITOT 0.4 07/12/2014 0404   GFRNONAA >60 02/20/2015 0559   GFRNONAA >60 07/19/2014 0243   GFRAA >60 02/20/2015 0559   GFRAA >60 07/19/2014 0243   PT/INR No results for input(s): LABPROT, INR in the last 72 hours.  Studies/Results: No results found.  Assessment/Plan: 74 year old female postop day 6 status post loop ileostomy creation. Patient has continued increased leukocytosis without obvious source. Patient has remained on antibiotics since surgery due to anastomotic leak with colovaginal fistula. Due to lack of other symptoms will check chest x-ray for possible pulmonary source, will check urinalysis for possible urinary source due to prolonged catheterization after her prior surgery. Continue ostomy and local wound care Electrolytes within normal limits today no replacement indicated Encourage ambulation, incentive spirometry use, by mouth intake Continue antibiotics day 8 of 10 Lovenox for DVT  prophylaxis  Ricarda Frame, MD [facility] General Surgeon [facility] Surgical 02/20/2015

## 2015-02-21 DIAGNOSIS — F4323 Adjustment disorder with mixed anxiety and depressed mood: Secondary | ICD-10-CM

## 2015-02-21 LAB — BASIC METABOLIC PANEL
Anion gap: 6 (ref 5–15)
CALCIUM: 8.1 mg/dL — AB (ref 8.9–10.3)
CO2: 27 mmol/L (ref 22–32)
CREATININE: 0.79 mg/dL (ref 0.44–1.00)
Chloride: 106 mmol/L (ref 101–111)
GFR calc Af Amer: >60 mL/min (ref >60)
GLUCOSE: 137 mg/dL — AB (ref 65–99)
Potassium: 3.6 mmol/L (ref 3.5–5.1)
Sodium: 139 mmol/L (ref 135–145)

## 2015-02-21 LAB — CBC WITH DIFFERENTIAL/PLATELET
Basophils Absolute: 0.1 10*3/uL (ref 0–0.1)
Basophils Relative: 1 %
EOS PCT: 3 %
Eosinophils Absolute: 0.4 10*3/uL (ref 0–0.7)
HEMATOCRIT: 35.3 % (ref 35.0–47.0)
Hemoglobin: 11.4 g/dL — ABNORMAL LOW (ref 12.0–16.0)
LYMPHS PCT: 9 %
Lymphs Abs: 1.3 10*3/uL (ref 1.0–3.6)
MCH: 29.5 pg (ref 26.0–34.0)
MCHC: 32.3 g/dL (ref 32.0–36.0)
MCV: 91.4 fL (ref 80.0–100.0)
MONO ABS: 1.1 10*3/uL — AB (ref 0.2–0.9)
MONOS PCT: 8 %
NEUTROS ABS: 11 10*3/uL — AB (ref 1.4–6.5)
Neutrophils Relative %: 79 %
PLATELETS: 302 10*3/uL (ref 150–440)
RBC: 3.86 MIL/uL (ref 3.80–5.20)
RDW: 15.5 % — AB (ref 11.5–14.5)
WBC: 13.9 10*3/uL — ABNORMAL HIGH (ref 3.6–11.0)

## 2015-02-21 LAB — MAGNESIUM: Magnesium: 1.6 mg/dL — ABNORMAL LOW (ref 1.7–2.4)

## 2015-02-21 LAB — PHOSPHORUS: Phosphorus: 3.1 mg/dL (ref 2.5–4.6)

## 2015-02-21 NOTE — Consult Note (Signed)
[facility] Face-to-Face Psychiatry Consult   Reason for Consult:  Consult for this 74 year old woman with a history of multiple medical problems. Concern about depression Referring Physician:  Marina Gravel Patient Identification: Martha Chung MRN:  [id number] Principal Diagnosis: Adjustment disorder with mixed anxiety and depressed mood Diagnosis:   Patient Active Problem List   Diagnosis Date Noted  . Adjustment disorder with mixed anxiety and depressed mood [F43.23] 02/17/2015  . Other postoperative complication involving digestive system [K91.89]   . 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 Arthritis [M19.90] 02/12/2015  . BP (high blood pressure) [I10] 02/12/2015  . Diverticulitis large intestine [K57.32] 01/31/2015  . Diverticulitis [K57.92] 12/07/2014  . 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 Essential (primary) hypertension [I10] 03/31/2014  . Acid reflux [K21.9] 03/31/2014  . Gout [M10.9] 03/31/2014  . Adult hypothyroidism [E03.9] 03/31/2014  . Osteopenia [M85.80] 03/31/2014    Total Time spent with patient: 1 hour  Subjective:   Martha Chung is a 74 y.o. female patient admitted with "yesterday was a bad day, and I think going through a lot".  HPI:  Information from the patient and the chart. 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 She reports that her mood has been feeling very stressed out. Yesterday was particularly bad because she was worried about her finances. That problem seems to of been resolved for the moment. At the same time she feels sick and tired of all medical problems that she is been having. She admits that yesterday she made statements about wishing that she were dead. She tells me that she is not having those thoughts today and absolutely denies that she had any thought about actually killing herself. Mood is been good. Sleep had  been chronically somewhat poor related to her medical problems. She was not having any suicidal thoughts at all. Only since coming back to the hospital is she feeling very frustrated. She doesn't report any hallucinations or psychotic symptoms. She is not currently taking any psychiatric medicine. Stresses include loneliness because she is living by herself having to take care of these medical problems.  Past psychiatric history: No history of psychiatric treatment anytime recently and no psychiatric hospitalization. Years ago she had lost 2 How children who died. She went through a period of depression and think she might of been on psychiatric medicine. She does not recall for sure. She denies any history of suicide attempts. Not currently in any therapy.  Medical history: Patient had a ruptured colon late last year and has had multiple surgeries now to try and deal with this and it's complications. Additionally has diverticulitis history of gout high blood pressure.  Social history: Patient lives by herself. In the area she has 2 grandchildren but doesn't get along with them well because she says they have drug problems. She has adult children but none of them live close by in the area. She is thinking about moving to [place] to be closer to one of them. Patient plays the [employer] and evidently does some work whether paid or volunteer I can't be sure. That seems to be a major part of her life. Church involvement is important for her.  Substance abuse history: Denies any history of any alcohol or drug problems now or in the past.  Family history: Grandchildren with substance abuse problems nothing else identified.  Update as of Monday. Patient continues  to report that her mood is feeling okay. She denies that she's having any suicidal thoughts at all. Says she is feeling more optimistic and has things she is looking forward to. On the other hand I do note that she still seems to be pretty blunted in her  affect. Nevertheless she is cooperative with treatment. Does not appear to be psychotic  HPI Elements:   Quality:  Depressed mood and anxiety and transient statements potentially suicidal. Severity:  Moderate severe at the time. Timing:  Happening during the low point of her anxieties about her current problems. Duration:  Resolving. Context:  Severe medical problems as well as social isolation.  Past Medical History:  Past Medical History  Diagnosis Date  . Hypertension   . Diverticulitis   . Hypothyroidism   . Gout   . Arthritis     hands, feet, "all over"  . GERD (gastroesophageal reflux disease)   . CHF (congestive heart failure)     acute, after use of Redux  . Mitral regurgitation     after use of redux  . COPD (chronic obstructive pulmonary disease)     chronic bronchitis - no meds  . Wears contact lenses   . Full dentures     upper and lower  . Right rotator cuff tendonitis   . Lichen sclerosus     Vaginal Area  . Complication of anesthesia     on vent after colostomy revision    Past Surgical History  Procedure Laterality Date  . Tonsillectomy    . Kidney surgery Right   . Cholecystectomy    . Colectomy with colostomy creation/hartmann procedure  07/03/14  . Laparotomy      construct new colostomy and place wound VAC  . Colonoscopy N/A 11/25/2014    Procedure: COLONOSCOPY;  Surgeon: Lucilla Lame, MD;  Location: [place];  Service: Gastroenterology;  Laterality: N/A;  . Polypectomy  11/25/2014    Procedure: POLYPECTOMY INTESTINAL;  Surgeon: Lucilla Lame, MD;  Location: [place];  Service: Gastroenterology;;  . Abdominal hysterectomy  1967    Partial  . Joint replacement Right 2006    knee  . Joint replacement Left 2008    knee  . Correction overlapping toes Bilateral 2012    bilateral hammertoes #2 & #3 both feet  . Colostomy takedown N/A 01/31/2015    Procedure: COLOSTOMY TAKEDOWN, partial colectomy;  Surgeon: Sherri Rad, MD;  Location: [facility]  ORS;  Service: General;  Laterality: N/A;  . Laparotomy N/A 02/14/2015    Procedure: RE-OPEN EXPLORATORY LAPAROTOMY, COMPLEX LYSIS OF ADHESIONS, CONSTRUCTION OF LOOP ILEOSTOMY,;  Surgeon: Sherri Rad, MD;  Location: [facility] ORS;  Service: General;  Laterality: N/A;  . Application of wound vac  02/14/2015    Procedure: APPLICATION OF WOUND VAC;  Surgeon: Sherri Rad, MD;  Location: [facility] ORS;  Service: General;;   Family History:  Family History  Problem Relation Age of Onset  . Hypertension Mother   . COPD Mother   . Heart disease Mother   . Cancer Mother     Breast  . Heart disease Father   . COPD Father   . Cancer Brother 63    Lung  . Cancer Daughter 7    Brain   Social History:  History  Alcohol Use No     History  Drug Use No    Social History   Social History  . Marital Status: Widowed    Spouse Name: N/A  . Number of Children:  N/A  . Years of Education: N/A   Social History Main Topics  . Smoking status: Former Smoker    Quit date: 07/03/1999  . Smokeless tobacco: Never Used  . Alcohol Use: No  . Drug Use: No  . Sexual Activity: Not Asked   Other Topics Concern  . None   Social History Narrative   Additional Social History:                          Allergies:   Allergies  Allergen Reactions  . Ivp Dye [Iodinated Diagnostic Agents] Anaphylaxis    Topical betadine is ok.  . Morphine And Related Anaphylaxis    Respiratory Arrest  . Shellfish Allergy Anaphylaxis  . Macrodantin [Nitrofurantoin] Nausea And Vomiting    And diarrhea  . Nubain [Nalbuphine Hcl] Other (See Comments)    BP "bottoms out"    Labs:  Results for orders placed or performed during the hospital encounter of 02/12/15 (from the past 48 hour(s))  CBC with Differential/Platelet     Status: Abnormal   Collection Time: 02/20/15  5:59 AM  Result Value Ref Range   WBC 13.2 (H) 3.6 - 11.0 K/uL   RBC 3.70 (L) 3.80 - 5.20 MIL/uL   Hemoglobin 10.6 (L) 12.0 - 16.0 g/dL   HCT 33.8 (L)  35.0 - 47.0 %   MCV 91.3 80.0 - 100.0 fL   MCH 28.7 26.0 - 34.0 pg   MCHC 31.5 (L) 32.0 - 36.0 g/dL   RDW 15.4 (H) 11.5 - 14.5 %   Platelets 316 150 - 440 K/uL   Neutrophils Relative % 74 %   Neutro Abs 9.8 (H) 1.4 - 6.5 K/uL   Lymphocytes Relative 13 %   Lymphs Abs 1.7 1.0 - 3.6 K/uL   Monocytes Relative 8 %   Monocytes Absolute 1.1 (H) 0.2 - 0.9 K/uL   Eosinophils Relative 4 %   Eosinophils Absolute 0.5 0 - 0.7 K/uL   Basophils Relative 1 %   Basophils Absolute 0.1 0 - 0.1 K/uL  Basic metabolic panel     Status: Abnormal   Collection Time: 02/20/15  5:59 AM  Result Value Ref Range   Sodium 141 135 - 145 mmol/L   Potassium 3.6 3.5 - 5.1 mmol/L   Chloride 108 101 - 111 mmol/L   CO2 25 22 - 32 mmol/L   Glucose, Bld 126 (H) 65 - 99 mg/dL   BUN 6 6 - 20 mg/dL   Creatinine, Ser 0.76 0.44 - 1.00 mg/dL   Calcium 8.0 (L) 8.9 - 10.3 mg/dL   GFR calc non Af Amer >60 >60 mL/min   GFR calc Af Amer >60 >60 mL/min    Comment: (NOTE) The eGFR has been calculated using the CKD EPI equation. This calculation has not been validated in all clinical situations. eGFR's persistently <60 mL/min signify possible Chronic Kidney Disease.    Anion gap 8 5 - 15  Magnesium     Status: Abnormal   Collection Time: 02/20/15  5:59 AM  Result Value Ref Range   Magnesium 1.6 (L) 1.7 - 2.4 mg/dL  Phosphorus     Status: None   Collection Time: 02/20/15  5:59 AM  Result Value Ref Range   Phosphorus 3.2 2.5 - 4.6 mg/dL  Urinalysis complete, with microscopic ([facility] only)     Status: Abnormal   Collection Time: 02/20/15  2:09 PM  Result Value Ref Range   Color, Urine YELLOW (A)  YELLOW   APPearance HAZY (A) CLEAR   Glucose, UA NEGATIVE NEGATIVE mg/dL   Bilirubin Urine NEGATIVE NEGATIVE   Ketones, ur NEGATIVE NEGATIVE mg/dL   Specific Gravity, Urine 1.014 1.005 - 1.030   Hgb urine dipstick 1+ (A) NEGATIVE   pH 7.0 5.0 - 8.0   Protein, ur NEGATIVE NEGATIVE mg/dL   Nitrite NEGATIVE NEGATIVE   Leukocytes,  UA 3+ (A) NEGATIVE   RBC / HPF 0-5 0 - 5 RBC/hpf   WBC, UA TOO NUMEROUS TO COUNT 0 - 5 WBC/hpf   Bacteria, UA RARE (A) NONE SEEN   Squamous Epithelial / LPF 6-30 (A) NONE SEEN   Mucous PRESENT   CBC with Differential/Platelet     Status: Abnormal   Collection Time: 02/21/15  7:05 AM  Result Value Ref Range   WBC 13.9 (H) 3.6 - 11.0 K/uL   RBC 3.86 3.80 - 5.20 MIL/uL   Hemoglobin 11.4 (L) 12.0 - 16.0 g/dL   HCT 35.3 35.0 - 47.0 %   MCV 91.4 80.0 - 100.0 fL   MCH 29.5 26.0 - 34.0 pg   MCHC 32.3 32.0 - 36.0 g/dL   RDW 15.5 (H) 11.5 - 14.5 %   Platelets 302 150 - 440 K/uL   Neutrophils Relative % 79 %   Neutro Abs 11.0 (H) 1.4 - 6.5 K/uL   Lymphocytes Relative 9 %   Lymphs Abs 1.3 1.0 - 3.6 K/uL   Monocytes Relative 8 %   Monocytes Absolute 1.1 (H) 0.2 - 0.9 K/uL   Eosinophils Relative 3 %   Eosinophils Absolute 0.4 0 - 0.7 K/uL   Basophils Relative 1 %   Basophils Absolute 0.1 0 - 0.1 K/uL  Basic metabolic panel     Status: Abnormal   Collection Time: 02/21/15  7:05 AM  Result Value Ref Range   Sodium 139 135 - 145 mmol/L   Potassium 3.6 3.5 - 5.1 mmol/L   Chloride 106 101 - 111 mmol/L   CO2 27 22 - 32 mmol/L   Glucose, Bld 137 (H) 65 - 99 mg/dL   BUN <5 (L) 6 - 20 mg/dL   Creatinine, Ser 0.79 0.44 - 1.00 mg/dL   Calcium 8.1 (L) 8.9 - 10.3 mg/dL   GFR calc non Af Amer >60 >60 mL/min   GFR calc Af Amer >60 >60 mL/min    Comment: (NOTE) The eGFR has been calculated using the CKD EPI equation. This calculation has not been validated in all clinical situations. eGFR's persistently <60 mL/min signify possible Chronic Kidney Disease.    Anion gap 6 5 - 15  Magnesium     Status: Abnormal   Collection Time: 02/21/15  7:05 AM  Result Value Ref Range   Magnesium 1.6 (L) 1.7 - 2.4 mg/dL  Phosphorus     Status: None   Collection Time: 02/21/15  7:05 AM  Result Value Ref Range   Phosphorus 3.1 2.5 - 4.6 mg/dL    Vitals: Blood pressure 133/48, pulse 63, temperature 98 F  (36.7 C), temperature source Oral, resp. rate 16, height '5\' 3"'  (1.6 m), weight 100.88 kg (222 lb 6.4 oz), SpO2 97 %.  Risk to Self: Is patient at risk for suicide?: No Risk to Others:   Prior Inpatient Therapy:   Prior Outpatient Therapy:    Current Facility-Administered Medications  Medication Dose Route Frequency Provider Last Rate Last Dose  . acetaminophen (TYLENOL) tablet 650 mg  650 mg Oral Q6H PRN Dia Crawford III, MD  Or  . acetaminophen (TYLENOL) suppository 650 mg  650 mg Rectal Q6H PRN Dia Crawford III, MD      . allopurinol (ZYLOPRIM) tablet 100 mg  100 mg Oral q morning - 10a Dia Crawford III, MD   100 mg at 02/21/15 0853  . ciprofloxacin (CIPRO) IVPB 400 mg  400 mg Intravenous Q12H Clayburn Pert, MD   400 mg at 02/21/15 1641  . dextrose 5 %-0.45 % sodium chloride infusion   Intravenous Continuous Sherri Rad, MD 75 mL/hr at 02/21/15 1804 1,000 mL at 02/21/15 1804  . diphenhydrAMINE (BENADRYL) injection 12.5 mg  12.5 mg Intravenous Q6H PRN Clayburn Pert, MD       Or  . diphenhydrAMINE (BENADRYL) 12.5 MG/5ML elixir 12.5 mg  12.5 mg Oral Q6H PRN Clayburn Pert, MD      . enoxaparin (LOVENOX) injection 40 mg  40 mg Subcutaneous Q24H Dia Crawford III, MD   40 mg at 02/21/15 0853  . fentaNYL (SUBLIMAZE) injection 12.5 mcg  12.5 mcg Intravenous Q2H PRN Clayburn Pert, MD   12.5 mcg at 02/16/15 0916  . levothyroxine (SYNTHROID, LEVOTHROID) tablet 75 mcg  75 mcg Oral QAC breakfast Dia Crawford III, MD   75 mcg at 02/21/15 [phone]  . metoCLOPramide (REGLAN) injection 10 mg  10 mg Intravenous Q8H PRN Clayburn Pert, MD   10 mg at 02/20/15 0254  . metoprolol (LOPRESSOR) tablet 50 mg  50 mg Oral BID Dia Crawford III, MD   50 mg at 02/21/15 0853  . metroNIDAZOLE (FLAGYL) IVPB 500 mg  500 mg Intravenous Q8H Dia Crawford III, MD   500 mg at 02/21/15 1641  . naloxone [facility]) injection 0.4 mg  0.4 mg Intravenous PRN Sherri Rad, MD       And  . sodium chloride 0.9 % injection 9 mL  9 mL Intravenous PRN Sherri Rad, MD   9 mL at 02/20/15 2140  . ondansetron (ZOFRAN-ODT) disintegrating tablet 4 mg  4 mg Oral Q6H PRN Dia Crawford III, MD       Or  . ondansetron [facility]) injection 4 mg  4 mg Intravenous Q6H PRN Dia Crawford III, MD   4 mg at 02/17/15 1732  . ondansetron (ZOFRAN) injection 4 mg  4 mg Intravenous Q6H PRN Sherri Rad, MD   4 mg at 02/18/15 1353  . oxyCODONE-acetaminophen (PERCOCET/ROXICET) 5-325 MG per tablet 1-2 tablet  1-2 tablet Oral Q4H PRN Clayburn Pert, MD   2 tablet at 02/17/15 1120  . pantoprazole (PROTONIX) injection 40 mg  40 mg Intravenous QHS Dia Crawford III, MD   40 mg at 02/20/15 2139  . traMADol (ULTRAM) tablet 100 mg  100 mg Oral Q6H PRN Dia Crawford III, MD   100 mg at 02/21/15 1646    Musculoskeletal: Strength & Muscle Tone: decreased Gait & Station: unable to stand Patient leans: N/A  Psychiatric Specialty Exam: Physical Exam  Vitals reviewed. Constitutional: She appears well-developed and well-nourished. She has a sickly appearance.  HENT:  Head: Normocephalic and atraumatic.  Eyes: Conjunctivae are normal. Pupils are equal, round, and reactive to light.  Neck: Normal range of motion.  Cardiovascular: Normal heart sounds.   Respiratory: Effort normal.  GI: Soft.    Musculoskeletal: Normal range of motion.  Neurological: She is alert.  Skin: Skin is warm and dry.  Psychiatric: Her speech is normal. Judgment and thought content normal. Her mood appears anxious. She is slowed. Cognition and memory are normal. She exhibits a depressed mood.  Patient  appears to be somewhat slowed and dysphoric. Denies suicidal ideation. No evidence of psychosis. Cooperative with the interview.    Review of Systems  Constitutional: Positive for malaise/fatigue.  HENT: Negative.   Eyes: Negative.   Respiratory: Negative.   Cardiovascular: Negative.   Gastrointestinal: Positive for abdominal pain.  Musculoskeletal: Negative.   Skin: Negative.   Neurological: Positive for weakness.    Psychiatric/Behavioral: Positive for depression. Negative for suicidal ideas, hallucinations, memory loss and substance abuse. The patient is nervous/anxious and has insomnia.     Blood pressure 133/48, pulse 63, temperature 98 F (36.7 C), temperature source Oral, resp. rate 16, height '5\' 3"'  (1.6 m), weight 100.88 kg (222 lb 6.4 oz), SpO2 97 %.Body mass index is 39.41 kg/(m^2).  General Appearance: Casual  Eye Contact::  Fair  Speech:  Slow  Volume:  Decreased  Mood:  Dysphoric  Affect:  Congruent  Thought Process:  Goal Directed  Orientation:  Full (Time, Place, and Person)  Thought Content:  Negative  Suicidal Thoughts:  No  Homicidal Thoughts:  No  Memory:  Immediate;   Good Recent;   Good Remote;   Good  Judgement:  Fair  Insight:  Fair  Psychomotor Activity:  Decreased  Concentration:  Fair  Recall:  [employer] of Knowledge:Fair  Language: Fair  Akathisia:  No  Handed:  Right  AIMS (if indicated):     Assets:  Communication Skills Desire for Improvement Financial Resources/Insurance Housing Social Support  ADL's:  Intact  Cognition: WNL  Sleep:      Medical Decision Making: New problem, with additional work up planned, Review or order clinical lab tests (1), Review or order medicine tests (1) and Review of Medication Regimen & Side Effects (2)  Treatment Plan Summary: Plan patient is still denying any suicidal ideation and although she looks like her affect is blunted she denies that she is feeling very depressed. Supportive counseling done. Reviewed patient's some symptoms of depression and the risks of untreated depression. We'll continue to defer starting any medication. Follow-up as needed.  Plan:  Patient does not meet criteria for psychiatric inpatient admission. Supportive therapy provided about ongoing stressors. Disposition: No medicine for now. Follow-up as needed.  John Clapacs 02/21/2015 6:42 PM

## 2015-02-21 NOTE — Care Management Important Message (Signed)
Important Message  Patient Details  Name: Martha Chung MRN: [id number] Date of Birth: 02-Nov-1940   Medicare Important Message Given:  Yes-fourth notification given    [name] 02/21/2015, 10:54 AM

## 2015-02-21 NOTE — Progress Notes (Signed)
Physical Therapy Treatment Patient Details Name: Martha Chung MRN: [id number] DOB: 09-13-1940 Today's Date: 02/21/2015    History of Present Illness Pt is a 74yo white female post-op colostomy takedown with partial colestomy. Pt reoprts this is her 7th abdominal surgery since January. She reports continued pain and a lack of self efficacy.     PT Comments    Pt tolerating treatment session well, motivated and able to complete ambulation, but very fatigued in general and asks to rest. Pt continues to make progress toward goals as evidenced by improved ambulation and pain management. Pt's greatest limitation continues to be activity tolerance, which continues to limit ability to perform all mobility tasks at baseline function. Patient presenting with impairment of strength, pain, and activity tolerance, limiting ability to perform ADL and mobility tasks at  baseline level of function. Patient will benefit from skilled intervention to address the above impairments and limitations, in order to restore to prior level of function, improve patient safety upon discharge, and to decrease caregiver burden.    Follow Up Recommendations  SNF     Equipment Recommendations  None recommended by PT    Recommendations for Other Services       Precautions / Restrictions Precautions Precautions: Fall Precaution Comments: Ileostomy bag Restrictions Weight Bearing Restrictions: No    Mobility  Bed Mobility Overal bed mobility: Modified Independent Bed Mobility: Supine to Sit;Sit to Supine     Supine to sit: Modified independent (Device/Increase time) Sit to supine: Modified independent (Device/Increase time)   General bed mobility comments: Much improved; not limited by pain this visit.   Transfers Overall transfer level: Needs assistance Equipment used: Rolling walker (2 wheeled) Transfers: Sit to/from Stand Sit to Stand: Supervision         General transfer comment: Reports  lightheadedness has improved but remains low level.   Ambulation/Gait Ambulation/Gait assistance: Min guard Ambulation Distance (Feet): 32 Feet Assistive device: Rolling walker (2 wheeled)     Gait velocity interpretation: <1.8 ft/sec, indicative of risk for recurrent falls     Stairs            Wheelchair Mobility    Modified Rankin (Stroke Patients Only)       Balance Overall balance assessment: Modified Independent;No apparent balance deficits (not formally assessed)                                  Cognition Arousal/Alertness: Awake/alert Behavior During Therapy: WFL for tasks assessed/performed;Flat affect Overall Cognitive Status: Within Functional Limits for tasks assessed                      Exercises      General Comments        Pertinent Vitals/Pain Pain Assessment:  (Pt reports pain is stable and tolerable, but does nto further illiterate. ) Pain Intervention(s): Limited activity within patient's tolerance;Monitored during session    Home Living                      Prior Function            PT Goals (current goals can now be found in the care plan section) Acute Rehab PT Goals Patient Stated Goal: Pt expresses intent to return to home and regain indep.  PT Goal Formulation: With patient Time For Goal Achievement: 03/03/15 Potential to Achieve Goals: Good Progress towards PT goals: Progressing toward  goals    Frequency  Min 2X/week    PT Plan Current plan remains appropriate    Co-evaluation             End of Session Equipment Utilized During Treatment: Gait belt Activity Tolerance: Patient tolerated treatment well;Patient limited by fatigue Patient left: in bed;with call bell/phone within reach;with bed alarm set;with family/visitor present     Time: 1610-9604 PT Time Calculation (min) (ACUTE ONLY): 13 min  Charges:  $Therapeutic Activity: 8-22 mins                    G Codes:       Milany Geck C 03/21/2015, 3:20 PM 3:23 PM  Rosamaria Lints, PT, DPT [place] License # [id number]

## 2015-02-21 NOTE — Progress Notes (Signed)
7 Days Post-Op  Subjective: Status post ileostomy for recurrent fistula. She states she's not sleeping well and not eating much but otherwise is doing well.  Objective: Vital signs in last 24 hours: Temp:  [97.5 F (36.4 C)-98.5 F (36.9 C)] 97.5 F (36.4 C) (08/22 0753) Pulse Rate:  [63-80] 69 (08/22 0753) Resp:  [16-18] 16 (08/22 0753) BP: (125-150)/(45-62) 150/52 mmHg (08/22 0753) SpO2:  [97 %] 97 % (08/22 0753) Last BM Date:  (ostomy)  Intake/Output from previous day: 08/21 0701 - 08/22 0700 In: 2223.1 [I.V.:1823.1; IV Piggyback:400] Out: 2775 [Urine:2100; Stool:675] Intake/Output this shift: Total I/O In: -  Out: 150 [Urine:150]  Physical exam:  Morbidly obese female. Bilious fluid in ileostomy bag. A reliance on dressing was some minimal bilious staining.  Lab Results: CBC   Recent Labs  02/20/15 0559 02/21/15 0705  WBC 13.2* 13.9*  HGB 10.6* 11.4*  HCT 33.8* 35.3  PLT 316 302   BMET  Recent Labs  02/20/15 0559 02/21/15 0705  NA 141 139  K 3.6 3.6  CL 108 106  CO2 25 27  GLUCOSE 126* 137*  BUN 6 <5*  CREATININE 0.76 0.79  CALCIUM 8.0* 8.1*   PT/INR No results for input(s): LABPROT, INR in the last 72 hours. ABG No results for input(s): PHART, HCO3 in the last 72 hours.  Invalid input(s): PCO2, PO2  Studies/Results: Dg Chest 2 View  02/20/2015   CLINICAL DATA:  Postoperative fever.  Ileostomy creation 6 days ago.  EXAM: CHEST  2 VIEW  COMPARISON:  02/15/2015.  FINDINGS: Improved aeration of the lungs with persistent subsegmental atelectasis at the RIGHT base. 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 RIGHT upper extremity PICC has been retracted and the tip is at the confluence of the brachiocephalic veins. Cardiopericardial silhouette is within normal limits. Probable small bilateral pleural effusions with blunting of both costophrenic angles on the lateral projection. Chronic lower thoracic compression fracture.  There is no airspace  consolidation. RIGHT lower lobe atelectasis is best visualized on the lateral projection.  IMPRESSION: Persistent RIGHT lower lobe subsegmental atelectasis which may account for postoperative fever. Overall the aeration is improved compared 02/15/2015.  Retraction of the PICC.   Electronically Signed   By: Andreas Newport M.D.   On: 02/20/2015 15:16    Anti-infectives: Anti-infectives    Start     Dose/Rate Route Frequency Ordered Stop   02/20/15 1700  ciprofloxacin (CIPRO) IVPB 400 mg     400 mg 200 mL/hr over 60 Minutes Intravenous Every 12 hours 02/20/15 1608     02/12/15 1000  cefTRIAXone (ROCEPHIN) 2 g in dextrose 5 % 50 mL IVPB  Status:  Discontinued     2 g 100 mL/hr over 30 Minutes Intravenous Every 24 hours 02/12/15 0945 02/20/15 1608   02/12/15 1000  metroNIDAZOLE (FLAGYL) IVPB 500 mg     500 mg 100 mL/hr over 60 Minutes Intravenous Every 8 hours 02/12/15 0945        Assessment/Plan: s/p Procedure(s): RE-OPEN EXPLORATORY LAPAROTOMY, COMPLEX LYSIS OF ADHESIONS, CONSTRUCTION OF LOOP ILEOSTOMY, APPLICATION OF WOUND VAC   Recurrent fistula will change dressings 3 times a day at this point she may benefit from a wound VAC at some point as well and I will discuss this with Dr. Egbert Garibaldi. Really she is not eating well enough to be discharged and with the recurrent fistula I would prefer she stay in the hospital on current therapy.Lattie Haw, MD, FACS  02/21/2015

## 2015-02-22 LAB — CBC WITH DIFFERENTIAL/PLATELET
BASOS ABS: 0.1 10*3/uL (ref 0–0.1)
Basophils Relative: 1 %
Eosinophils Absolute: 0.3 10*3/uL (ref 0–0.7)
Eosinophils Relative: 3 %
HCT: 33.9 % — ABNORMAL LOW (ref 35.0–47.0)
HEMOGLOBIN: 10.8 g/dL — AB (ref 12.0–16.0)
LYMPHS ABS: 1.3 10*3/uL (ref 1.0–3.6)
LYMPHS PCT: 12 %
MCH: 28.9 pg (ref 26.0–34.0)
MCHC: 31.8 g/dL — ABNORMAL LOW (ref 32.0–36.0)
MCV: 90.7 fL (ref 80.0–100.0)
Monocytes Absolute: 0.9 10*3/uL (ref 0.2–0.9)
Monocytes Relative: 8 %
NEUTROS PCT: 76 %
Neutro Abs: 8.5 10*3/uL — ABNORMAL HIGH (ref 1.4–6.5)
Platelets: 274 10*3/uL (ref 150–440)
RBC: 3.74 MIL/uL — AB (ref 3.80–5.20)
RDW: 15.6 % — ABNORMAL HIGH (ref 11.5–14.5)
WBC: 11.1 10*3/uL — AB (ref 3.6–11.0)

## 2015-02-22 NOTE — Consult Note (Signed)
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  Peristomal assessment: Intact with barrier ring for added support Treatment options for stomal/peristomal skin: Barrier ring Output Small amount liquid brown  Ostomy pouching: 2pc. 2 3/4 pouch with barrier ring.  Midline abdominal incision  Education provided: Patient is independent with self care.  WIll order her more barrier rings for next pouch change  Enrolled patient in [place] Secure Start Discharge program: No WOC team will continue to follow and remain available to patient, medical and nursing teams.  Maple Hudson RN BSN CWON Pager [phone]

## 2015-02-22 NOTE — Progress Notes (Signed)
Martha Chung, WOC nurse per Dr. Earmon Phoenix request. Clydie Braun verbalized that she would see the pt again in the morning (02/23/2015) to assess ileostomy. Ileostomy is not currently leaking, but pt's skin looks to be very irritated around the wafer.

## 2015-02-22 NOTE — Progress Notes (Signed)
8 Days Post-Op  Subjective: Postoperative everting ileostomy. Feels better but not eating much. Planes of rash around her ostomy.  Objective: Vital signs in last 24 hours: Temp:  [97.7 F (36.5 C)-98.1 F (36.7 C)] 98.1 F (36.7 C) (08/23 1530) Pulse Rate:  [55-71] 70 (08/23 1530) Resp:  [16-18] 17 (08/23 1530) BP: (97-137)/(42-63) 137/54 mmHg (08/23 1530) SpO2:  [96 %-100 %] 100 % (08/23 1530) Last BM Date:  (ostomy)  Intake/Output from previous day: 08/22 0701 - 08/23 0700 In: 2933.3 [I.V.:2413.3; IV Piggyback:520] Out: 2875 [Urine:2100; Stool:775] Intake/Output this shift: Total I/O In: 979 [P.O.:120; I.V.:859] Out: 800 [Urine:550; Stool:250]  Physical exam:  Leaking ileostomy appliance with lateral rash. Wound is granulating well and the midline no further fistula output.  Lab Results: CBC   Recent Labs  02/21/15 0705 02/22/15 0655  WBC 13.9* 11.1*  HGB 11.4* 10.8*  HCT 35.3 33.9*  PLT 302 274   BMET  Recent Labs  02/20/15 0559 02/21/15 0705  NA 141 139  K 3.6 3.6  CL 108 106  CO2 25 27  GLUCOSE 126* 137*  BUN 6 <5*  CREATININE 0.76 0.79  CALCIUM 8.0* 8.1*   PT/INR No results for input(s): LABPROT, INR in the last 72 hours. ABG No results for input(s): PHART, HCO3 in the last 72 hours.  Invalid input(s): PCO2, PO2  Studies/Results: No results found.  Anti-infectives: Anti-infectives    Start     Dose/Rate Route Frequency Ordered Stop   02/20/15 1700  ciprofloxacin (CIPRO) IVPB 400 mg     400 mg 200 mL/hr over 60 Minutes Intravenous Every 12 hours 02/20/15 1608     02/12/15 1000  cefTRIAXone (ROCEPHIN) 2 g in dextrose 5 % 50 mL IVPB  Status:  Discontinued     2 g 100 mL/hr over 30 Minutes Intravenous Every 24 hours 02/12/15 0945 02/20/15 1608   02/12/15 1000  metroNIDAZOLE (FLAGYL) IVPB 500 mg     500 mg 100 mL/hr over 60 Minutes Intravenous Every 8 hours 02/12/15 0945        Assessment/Plan: s/p Procedure(s): RE-OPEN EXPLORATORY  LAPAROTOMY, COMPLEX LYSIS OF ADHESIONS, CONSTRUCTION OF LOOP ILEOSTOMY, APPLICATION OF WOUND VAC   Patient slowly improving no fistula output and granulating midline wound. I have discussed with nursing and have asked for a visit from the ostomy nurse to assist with the ileostomy bag and leakage rash.  Lattie Haw, MD, FACS  02/22/2015

## 2015-02-22 NOTE — Progress Notes (Signed)
CSW following for dc planning. SNF [facility] ready for Pt when she is medically stable. [employer] auth received and updated clinicals sent to SNF for review.  Pt verbalized feeling better today than other days though still nauseous. CSW will continue to follow. FL2 on chart for signature.   Wilford Grist, LCSW [phone]

## 2015-02-22 NOTE — Discharge Summary (Signed)
Physician Discharge Summary  Patient ID: Martha Chung MRN: [id number] DOB/AGE: 1940-12-23 74 y.o.  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  Discharge Diagnoses:  Active Problems:   Diverticulitis large intestine   Discharged Condition: Stable and improved  Hospital Course: The patient was brought to the operating room the morning of her admission for colostomy reversal. Operative she had a nasogastric tube in place along with a Foley. She had adequate pain control utilizing a fentanyl PCA. On postoperative day #1 the nasogastric tube was able to be removed. She had adequate pain control and divide stable vital signs.  She started passing flatus by postoperative day #2. Her diet was able to be advanced from nothing by mouth to ice chips. She tolerated this well. She became more mobile once her Foley catheter was removed. PCA was discontinued and she was transitioned over to oral narcotics. Toradol was also administered. She was started back on her Lasix.  By later in the week she had several bowel movements. Nothing per vagina. Her diet was able to be advanced. Her wound was inspected and was healing nicely with no signs of drainage. She was seen by physical therapy and deemed a suitable candidate for discharge. Home health arrangements were made. She was discharged home in stable condition on postoperative day #7.   Consults: none.  Discharge Exam: Blood pressure 129/54, pulse 89, temperature 98.6 F (37 C), temperature source Oral, resp. rate 18, height  (1.6 m), weight 225 lb 12.8 oz (102.422 kg), SpO2 95 %.   Wound was healing nicely lungs are clear abdomen was soft.  Disposition: 01-Home or Self Care  Discharge Instructions    Call MD for:  difficulty breathing, headache or visual disturbances    Complete by:  As directed      Call MD for:  persistant nausea and  vomiting    Complete by:  As directed      Call MD for:  redness, tenderness, or signs of infection (pain, swelling, redness, odor or green/yellow discharge around incision site)    Complete by:  As directed      Call MD for:  severe uncontrolled pain    Complete by:  As directed      Call MD for:  temperature >100.4    Complete by:  As directed      Diet - low sodium heart healthy    Complete by:  As directed      Diet general    Complete by:  As directed      Discharge instructions    Complete by:  As directed   DISCHARGE INSTRUCTIONS TO PATIENT  REMINDER:  Carry a list of your medications and allergies with you at all times Call your pharmacy at least 1 week in advance to refill prescriptions Do not mix any prescribed pain medicine with alcohol Do not drive any motor vehicles while taking pain medication. Take medications with food.  Do not retake a pain medication if you vomit after taking it.  Activity: no lifting more than 15 pounds until instructed by your doctor.   Dressing Care Instruction (if applicable):              Remove operative dressings in 48 hours.  May Shower-  Call office if any questions regarding this activity.  Dry Dressing as needed to operative site.  Drain care instructions provided to you in the hospital.  Follow-up appointments (date to return to physician): Call for appointment with Dr. [name], MD at [phone] or [phone]  If need MD on call after hours and on weekends call Hospital operator at [phone] as ask to speak to Surgeon on call for [facility].  Call Surgeon if you have: Temperature greater than 100.4 Persistent nausea and vomiting Severe uncontrolled pain Redness, tenderness, or signs of infection (pain, swelling, redness, odor or green/yellow discharge around the site) Difficulty breathing, headache or visual disturbances Hives Persistent dizziness or light-headedness Extreme fatigue Any other  questions or concerns you may have after discharge  In an emergency, call 911 or go to an Emergency Department at a nearby hospital  Diet:  Resume your usual diet.  Avoid spicy, greasy or heavy foods.  If you have nausea or vomiting, go back to liquids.  If you cannot keep liquids down, call your doctor.  Avoid alcohol consumption while on prescription pain medications. Good nutrition promotes healing. Increase fiber and fluids.     I understand and acknowledge receipt of the above instructions.                                                                                                                                        Patient or Guardian Signature                                                                    Date/Time                                                                                                                                        Physician's or R.N.'s Signature                                                                  Date/Time  The discharge  instructions have been reviewed with the patient and/or Family Member/Parent/Guardian.  Patient and/or Family Member/Parent/Guardian signed and retained a printed copy.     Discharge wound care:    Complete by:  As directed   May shower  Dry dressing over incision as needed.     Increase activity slowly    Complete by:  As directed      Increase activity slowly    Complete by:  As directed      Remove dressing in 24 hours    Complete by:  As directed             Medication List    STOP taking these medications        bisacodyl 5 MG EC tablet  Commonly known as:  DULCOLAX     CALCIUM PO     EPINEPHrine 0.3 mg/0.3 mL Soaj injection  Commonly known as:  EPI-PEN     IMODIUM PO     levothyroxine 75 MCG tablet  Commonly known as:  SYNTHROID, LEVOTHROID     levothyroxine 88 MCG tablet  Commonly known as:  SYNTHROID, LEVOTHROID     multivitamin capsule     polyethylene glycol powder  powder  Commonly known as:  GLYCOLAX/MIRALAX     POTASSIUM PO     sodium phosphate 7-19 GM/118ML Enem      TAKE these medications        allopurinol 100 MG tablet  Commonly known as:  ZYLOPRIM  Take 100 mg by mouth daily. AM     clobetasol cream 0.05 %  Commonly known as:  TEMOVATE  Apply 1 application topically every other day.     famotidine 20 MG tablet  Commonly known as:  PEPCID  Take 20 mg by mouth 2 (two) times daily.     furosemide 20 MG tablet  Commonly known as:  LASIX  Take 20 mg by mouth every morning. AM     meloxicam 7.5 MG tablet  Commonly known as:  MOBIC  Take 7.5 mg by mouth 2 (two) times daily.     metoprolol 50 MG tablet  Commonly known as:  LOPRESSOR  Take 50 mg by mouth 2 (two) times daily.           Follow-up Information    Follow up with [name], MD On 02/10/2015.   Specialties:  Surgery, Radiology   Why:  For suture removal, For wound re-check, Thursday, August 11 at 3 p.m., [facility] information:   [address] [place] [address] [phone]       Signed: [name] 02/22/2015, 10:49 AM

## 2015-02-22 NOTE — Care Management (Signed)
Spoke with the patient for continued assessment and discharge planning. PT is pleasant today and talkative. Expressed understanding of condition and plan for STR upon discharge. Continues with dressing changes daily. Stated that she has been ambulating with staff. Anticipating she may discharge in a few days. CSW is following. Continue to follow for discharge needs.

## 2015-02-23 NOTE — Progress Notes (Signed)
Slight redness and warmth noted on pt's right upper arm near PICC line. IVF infusing. Dsg clean, dry & intact. Pt states area is a little tender to touch. Will continue to monitor.

## 2015-02-23 NOTE — Progress Notes (Signed)
Nutrition Follow-up   INTERVENTION:   Meals and Snacks: Cater to patient preferences. RD took dinner order. RD encouraged intake of kcals and protein on visit. Medical Food Supplement Therapy: pt does not want Boost Breeze any more and reports not wanting to try any other supplements right now Coordination of Care: will request new weight last one documented on admission   NUTRITION DIAGNOSIS:   Inadequate oral intake related to altered GI function as evidenced by NPO status, improved with diet order  GOAL:   Patient will meet greater than or equal to 90% of their needs; ongoing  MONITOR:    (Energy  intake, digestive system, Electrolyte and renal profile)   ASSESSMENT:    Pt POD9 diverting ileostomy for rectovaginal fistula.  Diet Order:  Diet regular Room service appropriate?: Yes; Fluid consistency:: Thin   Current Nutrition: Pt reports eating 50% of pancakes this am. Pt reports eating most of chicken quesidialla last night and chickn nuggets yesterday for lunch. Pt reports appetite is slowly improving asking for cheeseburger tonight on dinner tray.   Gastrointestinal Profile: Last BM: 85mL out ostomy today per documentation.   Medications: Cipro, flagyl, protonix, D5 0.45%NS at 85mL/hr (providing 306kcals in 24 hours)  Electrolyte/Renal Profile and Glucose Profile:   Recent Labs Lab 02/19/15 0710 02/20/15 0559 02/21/15 0705  NA 141 141 139  K 3.4* 3.6 3.6  CL 107 108 106  CO2 BUN <5* 6 <5*  CREATININE 0.82 0.76 0.79  CALCIUM 8.1* 8.0* 8.1*  MG 1.6* 1.6* 1.6*  PHOS 3.1 3.2 3.1  GLUCOSE 147* 126* 137*   Protein Profile:  Recent Labs Lab 02/19/15 0710  ALBUMIN 2.5*     Weight Trend since Admission: Filed Weights   02/12/15 0845 02/12/15 1216  Weight: 225 lb (102.059 kg) 222 lb 6.4 oz (100.88 kg)    BMI:  Body mass index is 39.41 kg/(m^2).  Estimated Nutritional Needs:   Kcal:  BEE 984 kcals (IF 1.2-1.5, AF 1.3) 1610-9604  kcals/d  Protein:  (1.2-1.5 g/kg) 62-78 g/d  Fluid:  (30-41ml/kg) 1560-1897ml/d  EDUCATION NEEDS:   No education needs identified at this time  MODERATE Care Level  Leda Quail, RD, LDN Pager [phone]

## 2015-02-23 NOTE — Progress Notes (Signed)
Pt continues to c/o burning around ileostomy. Skin noted to be red & irritated around the wafer. Pt requested ileostomy bag to be changed. Pt states that Dr. Tonita Cong has been the only one to change her bag so far and pt also states that Dr. Tonita Cong told her that he did not want the nurses changing the bag due to the 2 clips holding the stoma in place. Notified Dr. Michela Pitcher of patient's status. MD states "you can change the bag yourself or wait until Dr. Tonita Cong comes in the morning". Charge Nurse & [employer] notified and assessed pt as well. Cleaned skin and applied gauze secured with tegaderm around wafer to protect surrounding skin from leakage. Pt in agreement with waiting for Dr. Tonita Cong to change the bag tomorrow. Per day shift nurse's report, wound care nurse will be in to see pt tomorrow morning. Will continue to monitor.

## 2015-02-23 NOTE — Progress Notes (Signed)
Physical Therapy Treatment Patient Details Name: Martha Chung MRN: [id number] DOB: March 07, 1941 Today's Date: 02/23/2015    History of Present Illness Pt is a 74yo white female post-op colostomy takedown with partial colestomy. Pt reoprts this is her 7th abdominal surgery since January. She reports continued pain and a lack of self efficacy.     PT Comments    Patient demonstrates significant improvement with her independence with bed mobility, OOB transfers, and ambulation with RW. Patient fatigues quickly of what she describes as "dizziness" from lack of sleep last night. She demonstrates decreased step length and increased 5x sit to stand time (23 seconds) indicating she is at a high falls risk currently. Patient is able to take reciprocal steps with RW with no loss of balance. Skilled acute PT services continue to be indicated to address her mobility deficits.   Follow Up Recommendations  SNF     Equipment Recommendations       Recommendations for Other Services OT consult     Precautions / Restrictions Precautions Precautions: Fall Precaution Comments: Ileostomy bag Restrictions Weight Bearing Restrictions: No    Mobility  Bed Mobility Overal bed mobility: Modified Independent Bed Mobility: Supine to Sit;Sit to Supine     Supine to sit: Modified independent (Device/Increase time) Sit to supine: Modified independent (Device/Increase time)   General bed mobility comments: Patient is transferring in and out of bed with the head flat, using hand rails on her own throughout the day now she reports. She requires no physical assistance during this session.   Transfers Overall transfer level: Needs assistance Equipment used: Rolling walker (2 wheeled) Transfers: Sit to/from Stand Sit to Stand: Supervision         General transfer comment: Patient reports some light-headed/dizzy feeling "from not sleeping well last night". She completes sit to stand with no loss of  balance or external support.   Ambulation/Gait Ambulation/Gait assistance: Supervision Ambulation Distance (Feet): 80 Feet Assistive device: Rolling walker (2 wheeled) Gait Pattern/deviations: Trunk flexed;Decreased step length - right;Decreased step length - left   Gait velocity interpretation: Below normal speed for age/gender General Gait Details: Patient tolerates mobility well today, however she fatigues quickly and asks to return to her room as she is "dizzy or light headed from not sleeping well".    Stairs            Wheelchair Mobility    Modified Rankin (Stroke Patients Only)       Balance Overall balance assessment: Modified Independent Sitting-balance support: Feet unsupported Sitting balance-Leahy Scale: Good Sitting balance - Comments: Patient displays no balance deficits in sitting.   Standing balance support: Bilateral upper extremity supported Standing balance-Leahy Scale: Fair Standing balance comment: Patient is able to ambulate with RW and no loss of balance and no assistance. Patient has been transferring herself on and off the bedside commode independently recently.                     Cognition Arousal/Alertness: Awake/alert Behavior During Therapy: WFL for tasks assessed/performed;Flat affect Overall Cognitive Status: Within Functional Limits for tasks assessed                      Exercises Total Joint Exercises Ankle Circles/Pumps: AROM;Both;10 reps Straight Leg Raises: AROM;Both;10 reps Long Arc Quad: AROM;Both;10 reps Other Exercises Other Exercises: 5x sit to stand in 23 seconds.  Other Exercises: Patient rolled and turned independently to complete cleaning.     General Comments General comments (  skin integrity, edema, etc.): Patient had drainage from incision around her inner thighs, RN notified and assisted with clean up.       Pertinent Vitals/Pain Pain Assessment: Faces Faces Pain Scale: Hurts a little bit Pain  Location: Abdominal area  Pain Descriptors / Indicators: Discomfort;Aching Pain Intervention(s): Limited activity within patient's tolerance;Monitored during session    Home Living                      Prior Function            PT Goals (current goals can now be found in the care plan section) Acute Rehab PT Goals Patient Stated Goal: Pt expresses intent to return to home and regain indep.  PT Goal Formulation: With patient Time For Goal Achievement: 03/03/15 Potential to Achieve Goals: Good Progress towards PT goals: Progressing toward goals    Frequency  Min 2X/week    PT Plan Current plan remains appropriate    Co-evaluation             End of Session Equipment Utilized During Treatment: Gait belt Activity Tolerance: Patient tolerated treatment well;Patient limited by fatigue Patient left: in bed;with call bell/phone within reach (Patient declined bed alarm.)     Time: 4854-6270 PT Time Calculation (min) (ACUTE ONLY): 29 min  Charges:  $Gait Training: 8-22 mins $Therapeutic Exercise: 8-22 mins                    G Codes:      Kerin Ransom, PT, DPT    02/23/2015, 2:12 PM

## 2015-02-23 NOTE — Progress Notes (Signed)
9 Days Post-Op  Subjective: Status post diverting ileostomy for rectovaginal fistula. Patient is concerned about a lot of drainage from her midline wound.  Objective: Vital signs in last 24 hours: Temp:  [98.1 F (36.7 C)-98.3 F (36.8 C)] 98.3 F (36.8 C) (08/24 0751) Pulse Rate:  [67-84] 72 (08/24 1329) Resp:  [17-18] 17 (08/24 0751) BP: (124-137)/(48-54) 124/49 mmHg (08/24 0751) SpO2:  [95 %-100 %] 95 % (08/24 1329) Last BM Date:  (ostomy)  Intake/Output from previous day: 08/23 0701 - 08/24 0700 In: 2765 [P.O.:600; I.V.:2165] Out: 2960 [Urine:1675; Stool:1285] Intake/Output this shift: Total I/O In: 649 [I.V.:649] Out: 510 [Urine:500; Stool:10]  Physical exam:  Wound is inspected as is the ileostomy there is no drainage from her midline wound but there is succus eking from the appliance to the midline wound and pooling around the retention sutures. The midline caudad wound is granulating well without drainage or signs of infection.  The Hollister bridge is removed. And a new bag is placed.  Lab Results: CBC   Recent Labs  02/21/15 0705 02/22/15 0655  WBC 13.9* 11.1*  HGB 11.4* 10.8*  HCT 35.3 33.9*  PLT 302 274   BMET  Recent Labs  02/21/15 0705  NA 139  K 3.6  CL 106  CO2 27  GLUCOSE 137*  BUN <5*  CREATININE 0.79  CALCIUM 8.1*   PT/INR No results for input(s): LABPROT, INR in the last 72 hours. ABG No results for input(s): PHART, HCO3 in the last 72 hours.  Invalid input(s): PCO2, PO2  Studies/Results: No results found.  Anti-infectives: Anti-infectives    Start     Dose/Rate Route Frequency Ordered Stop   02/20/15 1700  ciprofloxacin (CIPRO) IVPB 400 mg     400 mg 200 mL/hr over 60 Minutes Intravenous Every 12 hours 02/20/15 1608     02/12/15 1000  cefTRIAXone (ROCEPHIN) 2 g in dextrose 5 % 50 mL IVPB  Status:  Discontinued     2 g 100 mL/hr over 30 Minutes Intravenous Every 24 hours 02/12/15 0945 02/20/15 1608   02/12/15 1000   metroNIDAZOLE (FLAGYL) IVPB 500 mg     500 mg 100 mL/hr over 60 Minutes Intravenous Every 8 hours 02/12/15 0945        Assessment/Plan: s/p Procedure(s): RE-OPEN EXPLORATORY LAPAROTOMY, COMPLEX LYSIS OF ADHESIONS, CONSTRUCTION OF LOOP ILEOSTOMY, APPLICATION OF WOUND VAC   Patient was concerned about this from her midline abdominal wound however on inspection it is leakage from her appliance from her ileostomy. This leakage is been a problem because of the Hollister bridge as well as the tension sutures. The Hollister bridge is been removed but the retention sutures must stay in place. I discussed this with the RN and asked her to replace the appliance as best she can and to clean up the patient's abdominal wall which is bathed in succus at this time.  Lattie Haw, MD, FACS  02/23/2015

## 2015-02-23 NOTE — Consult Note (Signed)
WOC ostomy consult note Stoma type/location: RLQ Ileostomy Stomal assessment/size: 2 " round moist and well budded. Retention rod in place Peristomal assessment: Deep creasing at 5:00 noted.  WIll use barrier ring to caulk in this defect and on the lower half of the stoma for improved wear time.  Treatment options for stomal/peristomal skin: Stoma powder for reddened, intact skin.  Barrier ring in creasing and on lower half of stoma.  Output Liquid green stool Ostomy pouching: 2pc. 2 1/4" pouch with barrier ring and stoma powder Education provided: Discussed pouching with retention rod (how to maneuver rod), rationale for powder and barrier ring.  Patient had used these items with her colostomy and feels comfortable with them.   Enrolled patient in [employer] DC program: No WOC team will continue to follow.  Maple Hudson RN BSN CWON Pager [phone]

## 2015-02-23 NOTE — Care Management Important Message (Signed)
Important Message  Patient Details  Name: NICLE CONNOLE MRN: [id number] Date of Birth: [birth year]   Medicare Important Message Given:  Yes-second notification given    [name] 02/23/2015, 11:03 AM

## 2015-02-24 NOTE — Progress Notes (Signed)
Pt alert and oriented. Ostomy site changed with assist this AM, lasted throughout shift without soiling at base. Pt very sad about present situation, had negative outlook. Support given to pt.   Reached out to wound care nurse pager x2, upon no follow-up complete dressing change took place.  No further signs of soiled dressing noted. Later in shift Pt thought it was leaking, but residue from pouch velcro strip may have been the cause of soiled tape at bottom, as the base of the dressing remained dry and intact. Bottom dressing parameter reinforced with tape and steri-strip x2.   Method Used:Stoma was surrounded with adapt. Blistered site was covered with petroleum gauze. Followed by paste on lateral right and medial lower sides surrounding the stoma. Steri-strip was added to cover the surrounding area followed by more paste. The base of the 2 part pouch was placed and covered with tape around the edges to secure.   This lasted throughout the shift. The patient mentioned "today's dressing was better than yesterday". Patient had higher hopes of progressing in regards to ostomy care and situation. Continue to assess.

## 2015-02-24 NOTE — Progress Notes (Signed)
Pt continues to c/o leakage around ileostomy today. Pt tearful and states "I just want to give up". Pt wonders how her ileostomy will be managed while she is in rehab. Offered emotional support to pt. Changed wafer & ileostomy bag x2 this shift. Redness & irritation noted around stoma. Applied ABD pads, guaze & paper tape around area for extra protection. Will continue to monitor.

## 2015-02-24 NOTE — Progress Notes (Signed)
10 Days Post-Op  Subjective: Status post ileostomy for diversion. She is very depressed concerning the inability to control leakage from her bag. The nurses been working with the patient  Objective: Vital signs in last 24 hours: Temp:  [97.7 F (36.5 C)-98.3 F (36.8 C)] 98.3 F (36.8 C) (08/25 1058) Pulse Rate:  [65-75] 75 (08/25 1058) Resp:  [14-18] 14 (08/25 1058) BP: (108-131)/(41-49) 131/42 mmHg (08/25 1058) SpO2:  [95 %-98 %] 96 % (08/25 1058) Weight:  [216 lb 4.3 oz (98.1 kg)-216 lb 11.2 oz (98.294 kg)] 216 lb 4.3 oz (98.1 kg) (08/25 0500) Last BM Date: 02/24/15  Intake/Output from previous day: 08/24 0701 - 08/25 0700 In: 2063 [I.V.:2063] Out: 1685 [Urine:1450; Stool:235] Intake/Output this shift: Total I/O In: 0  Out: 300 [Urine:300]  Physical exam:  Again ostomy bags  leaking laterally wound is clean without erythema no leakage immediately now granulation tissue present in the inferior caudad section of the wound. As are nontender.  Lab Results: CBC   Recent Labs  02/22/15 0655  WBC 11.1*  HGB 10.8*  HCT 33.9*  PLT 274   BMET No results for input(s): NA, K, CL, CO2, GLUCOSE, BUN, CREATININE, CALCIUM in the last 72 hours. PT/INR No results for input(s): LABPROT, INR in the last 72 hours. ABG No results for input(s): PHART, HCO3 in the last 72 hours.  Invalid input(s): PCO2, PO2  Studies/Results: No results found.  Anti-infectives: Anti-infectives    Start     Dose/Rate Route Frequency Ordered Stop   02/20/15 1700  ciprofloxacin (CIPRO) IVPB 400 mg     400 mg 200 mL/hr over 60 Minutes Intravenous Every 12 hours 02/20/15 1608     02/12/15 1000  cefTRIAXone (ROCEPHIN) 2 g in dextrose 5 % 50 mL IVPB  Status:  Discontinued     2 g 100 mL/hr over 30 Minutes Intravenous Every 24 hours 02/12/15 0945 02/20/15 1608   02/12/15 1000  metroNIDAZOLE (FLAGYL) IVPB 500 mg     500 mg 100 mL/hr over 60 Minutes Intravenous Every 8 hours 02/12/15 0945         Assessment/Plan: s/p Procedure(s): RE-OPEN EXPLORATORY LAPAROTOMY, COMPLEX LYSIS OF ADHESIONS, CONSTRUCTION OF LOOP ILEOSTOMY, APPLICATION OF WOUND VAC   Other than the leakage from the ileostomy appliance, she is improving from medical standpoint but she is depressed concerning the care and the failure to control this leakage. We'll discuss with the me nurse any other alternatives of this is related to her obesity unfortunately the patient is nearly bedridden because of this leakage.  Lattie Haw, MD, FACS  02/24/2015

## 2015-02-25 MED ORDER — DIPHENOXYLATE-ATROPINE 2.5-0.025 MG PO TABS
1.0000 | ORAL_TABLET | Freq: Four times a day (QID) | ORAL | Status: DC
  Administered 2015-02-25 – 2015-03-03 (×23): 1 via ORAL
  Filled 2015-02-25 (×23): qty 1

## 2015-02-25 MED ORDER — CHOLESTYRAMINE LIGHT 4 G PO PACK
4.0000 g | PACK | Freq: Two times a day (BID) | ORAL | Status: DC
  Administered 2015-02-26: 4 g via ORAL
  Filled 2015-02-25 (×7): qty 1

## 2015-02-25 NOTE — Care Management Important Message (Signed)
Important Message  Patient Details  Name: Martha Chung MRN: [id number] Date of Birth: October 07, 1940   Medicare Important Message Given:  Yes-third notification given    [name] 02/25/2015, 10:31 AM

## 2015-02-25 NOTE — Progress Notes (Signed)
PT Cancellation Note  Patient Details Name: Martha Chung MRN: [id number] DOB: Jun 30, 1941   Cancelled Treatment:    Reason Eval/Treat Not Completed: Medical issues which prohibited therapy (Treatment session re-attempted.  Wound care specialist at bedside for continued management of ostomy appliances (for control of drainage).  Will re-attempt at later date as medically appropriate and available.)  Aniayah Alaniz H. Manson Passey, PT, DPT, NCS 02/25/2015, 3:10 PM [phone]

## 2015-02-25 NOTE — Consult Note (Addendum)
WOC ostomy follow up Stoma type/location: RLQ ileostomy. No Rod in place.  Midline incision with retention sutures. Stomal Os at 9 o'clock and at skin level. Pt and staff report that pouches have been leaking since Wednesday and that attempts to repouch have been unsuccessful.  Skin is denuded circumferentially and parastomal skin outside of ostomy plane from 6-10 o'clock is erythematous, edematous and with pinpoint open (partial thickness) open areas. Patient is tearful and says she does "not want this ileostomy"; states that she cannot "possibly learn to manage it if nurses cannot". Stomal assessment/size: 1 and 5/8 inch round, red, budded. Os at 9 o'clock and at skin level Peristomal assessment: As noted above. Treatment options for stomal/peristomal skin: Skin lightly dusted with pectin powder, excess is brushed away. Skin barrier ring placed around stoma; 1/4 skin barrier ring is rolled and pressed into crease at 5 o'clock caused by retention suture Output: thin yellow/gold effluent with few, small stool flecks Ostomy pouching: 1pc.convex pouching system with skin barrier rings as noted above.  Education provided: Extended session to calm patient, teach her and visiting friend about A&P, stoma characteristics, stool characteristics (different from Colostomy), diet and fluid requirements of ileostomy-especially the BRAT diet.  Patient is taught sick-day rules and that the best time to change an ileostomy pouching system is in the morning prior to having anything to drink. A pattern is provided and pouching system taught after one is applied.  Staff is asked to empty pouch every hour during the day and until 11pm and every 2 hours at night (until 6am) to avoid leakage.Patient and friend provided with my contact information.  At the moment, she is calmer, somewhat more confident and hopeful. Enrolled patient in [place] Secure Start Discharge program: Yes WOC nursing team will follow, and will remain  available to this patient, the nursing, surgical and medical teams.   Thanks, Ladona Mow, MSN, RN, GNP, CWOCN, CWON-AP [phone]) Extended visits (2) including obtaining of supplies  = 3 hours

## 2015-02-25 NOTE — Clinical Social Work Note (Signed)
CSW spoke with Paula Compton at [facility] and patient's Martha Chung is good until Monday so if patient needs to discharge over the weekend she could. York Spaniel MSW,LCSW [phone]

## 2015-02-25 NOTE — Progress Notes (Signed)
11 Days Post-Op  Subjective: Patient is status post diverting ileostomy. She continues to have ongoing problems with her appliances. She is leaking constantly and an ostomy nurse is due to see her.  Objective: Vital signs in last 24 hours: Temp:  [98.3 F (36.8 C)-98.8 F (37.1 C)] 98.4 F (36.9 C) (08/26 0900) Pulse Rate:  [67-77] 67 (08/26 0900) Resp:  [14-18] 14 (08/26 0900) BP: (91-142)/(33-62) 142/41 mmHg (08/26 0900) SpO2:  [96 %-99 %] 96 % (08/26 0900) Last BM Date: 02/24/15  Intake/Output from previous day: 08/25 0701 - 08/26 0700 In: 1145 [P.O.:240; I.V.:560; IV Piggyback:345] Out: 2650 [Urine:2000; Stool:650] Intake/Output this shift:    Physical exam:  Obese soft nontender disease exam  Lab Results: CBC  No results for input(s): WBC, HGB, HCT, PLT in the last 72 hours. BMET No results for input(s): NA, K, CL, CO2, GLUCOSE, BUN, CREATININE, CALCIUM in the last 72 hours. PT/INR No results for input(s): LABPROT, INR in the last 72 hours. ABG No results for input(s): PHART, HCO3 in the last 72 hours.  Invalid input(s): PCO2, PO2  Studies/Results: No results found.  Anti-infectives: Anti-infectives    Start     Dose/Rate Route Frequency Ordered Stop   02/20/15 1700  ciprofloxacin (CIPRO) IVPB 400 mg     400 mg 200 mL/hr over 60 Minutes Intravenous Every 12 hours 02/20/15 1608     02/12/15 1000  cefTRIAXone (ROCEPHIN) 2 g in dextrose 5 % 50 mL IVPB  Status:  Discontinued     2 g 100 mL/hr over 30 Minutes Intravenous Every 24 hours 02/12/15 0945 02/20/15 1608   02/12/15 1000  metroNIDAZOLE (FLAGYL) IVPB 500 mg     500 mg 100 mL/hr over 60 Minutes Intravenous Every 8 hours 02/12/15 0945        Assessment/Plan: s/p Procedure(s): RE-OPEN EXPLORATORY LAPAROTOMY, COMPLEX LYSIS OF ADHESIONS, CONSTRUCTION OF LOOP ILEOSTOMY, APPLICATION OF WOUND VAC   Ostomy appliance problems at this point plus 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 Ostomy  nurse to see today possible discharge to nursing home in the next few days.  Lattie Haw, MD, FACS  02/25/2015  Status post diverting ileostomy. Patient continues to have abdominal films with her appliance and drainage around the ostomy. A an ostomy nurse is due to see the patient.

## 2015-02-25 NOTE — Progress Notes (Signed)
PT Cancellation Note  Patient Details Name: Martha Chung MRN: [id number] DOB: 11-Feb-1941   Cancelled Treatment:    Reason Eval/Treat Not Completed: Medical issues which prohibited therapy (Upon entry to room, patient in tears, stating "it's leaking again", referring to ostomy.  RN informed/aware and at bedside for dressing management.  Will re-attempt at later time/date as medically appropriate.)   Mane Consolo H. Manson Passey, PT, DPT, NCS 02/25/2015, 11:58 AM [phone]

## 2015-02-25 NOTE — Progress Notes (Signed)
Paged Maple Hudson, wound care nurse, re coming to see pt; awaiting callback

## 2015-02-26 NOTE — Clinical Social Work Note (Signed)
MD documented today that he does not feel comfortable with sending patient to [facility] for STR today due to having leakage surrounding ostomy. MD to reassess tomorrow. CSW has notified Crystal at [facility] and she is aware. York Spaniel MSW,LCSW [phone]

## 2015-02-26 NOTE — Progress Notes (Signed)
12 Days Post-Op  Subjective: Status post diverting ileostomy. Patient feels better today and more comfortable in that the ostomy nurse was able to help with the appliance and she had no leakage most of yesterday until about 4 AM this morning. She is more confident with the appliance.  Objective: Vital signs in last 24 hours: Temp:  [97.8 F (36.6 C)-98.4 F (36.9 C)] 97.8 F (36.6 C) (08/27 0826) Pulse Rate:  [66-71] 66 (08/27 0826) Resp:  [14-17] 16 (08/26 2332) BP: (97-142)/(34-70) 97/34 mmHg (08/27 0826) SpO2:  [93 %-98 %] 93 % (08/27 0826) Last BM Date: 02/25/15  Intake/Output from previous day: 08/26 0701 - 08/27 0700 In: 1929 [I.V.:1929] Out: 1890 [Urine:1150; Stool:740] Intake/Output this shift: Total I/O In: -  Out: 25 [Stool:25]  Physical exam:  Obese soft nontender abdomen floor and leakage at this point laterally and medially. Granulating tissue in the caudad wound nontender calves  Lab Results: CBC  No results for input(s): WBC, HGB, HCT, PLT in the last 72 hours. BMET No results for input(s): NA, K, CL, CO2, GLUCOSE, BUN, CREATININE, CALCIUM in the last 72 hours. PT/INR No results for input(s): LABPROT, INR in the last 72 hours. ABG No results for input(s): PHART, HCO3 in the last 72 hours.  Invalid input(s): PCO2, PO2  Studies/Results: No results found.  Anti-infectives: Anti-infectives    Start     Dose/Rate Route Frequency Ordered Stop   02/20/15 1700  ciprofloxacin (CIPRO) IVPB 400 mg     400 mg 200 mL/hr over 60 Minutes Intravenous Every 12 hours 02/20/15 1608     02/12/15 1000  cefTRIAXone (ROCEPHIN) 2 g in dextrose 5 % 50 mL IVPB  Status:  Discontinued     2 g 100 mL/hr over 30 Minutes Intravenous Every 24 hours 02/12/15 0945 02/20/15 1608   02/12/15 1000  metroNIDAZOLE (FLAGYL) IVPB 500 mg     500 mg 100 mL/hr over 60 Minutes Intravenous Every 8 hours 02/12/15 0945        Assessment/Plan: s/p Procedure(s): RE-OPEN EXPLORATORY  LAPAROTOMY, COMPLEX LYSIS OF ADHESIONS, CONSTRUCTION OF LOOP ILEOSTOMY, APPLICATION OF WOUND VAC   Improving slowly her outlook is improved considerably however there is considerable leakage this morning I'm hesitant to send her to a rehabilitation facility with this process because the nursing staff at the facility would not be able to handle this any better than we have and the ostomy care is important. Continue in-hospital for now.  Lattie Haw, MD, FACS  02/26/2015

## 2015-02-27 MED ORDER — FAMOTIDINE 20 MG PO TABS
20.0000 mg | ORAL_TABLET | Freq: Two times a day (BID) | ORAL | Status: DC
  Administered 2015-02-27 – 2015-03-03 (×9): 20 mg via ORAL
  Filled 2015-02-27 (×9): qty 1

## 2015-02-27 NOTE — Progress Notes (Signed)
13 Days Post-Op  Subjective: Still having nausea but overall a better day for her. She had less leakage from the ileostomy site.  Objective: Vital signs in last 24 hours: Temp:  [97.8 F (36.6 C)-98.3 F (36.8 C)] 98.3 F (36.8 C) (08/28 0017) Pulse Rate:  [61-72] 72 (08/28 0017) Resp:  [16] 16 (08/28 0017) BP: (97-134)/(34-60) 102/36 mmHg (08/28 0017) SpO2:  [93 %-97 %] 96 % (08/28 0017) Last BM Date: 02/25/15  Intake/Output from previous day: 08/27 0701 - 08/28 0700 In: 2780 [P.O.:120; I.V.:2360; IV Piggyback:300] Out: 2250 [Urine:1500; Stool:750] Intake/Output this shift:    Physical exam:  Obese, soft nontender abdomen no leakage noted this morning. Granulating midline wound.  Lab Results: CBC  No results for input(s): WBC, HGB, HCT, PLT in the last 72 hours. BMET No results for input(s): NA, K, CL, CO2, GLUCOSE, BUN, CREATININE, CALCIUM in the last 72 hours. PT/INR No results for input(s): LABPROT, INR in the last 72 hours. ABG No results for input(s): PHART, HCO3 in the last 72 hours.  Invalid input(s): PCO2, PO2  Studies/Results: No results found.  Anti-infectives: Anti-infectives    Start     Dose/Rate Route Frequency Ordered Stop   02/20/15 1700  ciprofloxacin (CIPRO) IVPB 400 mg     400 mg 200 mL/hr over 60 Minutes Intravenous Every 12 hours 02/20/15 1608     02/12/15 1000  cefTRIAXone (ROCEPHIN) 2 g in dextrose 5 % 50 mL IVPB  Status:  Discontinued     2 g 100 mL/hr over 30 Minutes Intravenous Every 24 hours 02/12/15 0945 02/20/15 1608   02/12/15 1000  metroNIDAZOLE (FLAGYL) IVPB 500 mg     500 mg 100 mL/hr over 60 Minutes Intravenous Every 8 hours 02/12/15 0945        Assessment/Plan: s/p Procedure(s): RE-OPEN EXPLORATORY LAPAROTOMY, COMPLEX LYSIS OF ADHESIONS, CONSTRUCTION OF LOOP ILEOSTOMY, APPLICATION OF WOUND VAC   Patient doing better. Will have ostomy nurse see patient tomorrow and then schedule for transfer if leakage is controlled and  the patient is able to take care of her ileostomy properly. Discussed with social services who will have to resubmit for approval tomorrow.Lattie Haw, MD, FACS  02/27/2015

## 2015-02-27 NOTE — Progress Notes (Signed)
Notified Dr. Excell Seltzer pt complains of acid in stomach and difficulty with appetite. Pt requested Pepcid 20mg  BID stating  that it might improve her appetite. Dr. Excell Seltzer approved of order to be put in for pt for Pepcid 20mg  BID.

## 2015-02-27 NOTE — Consult Note (Addendum)
WOC ostomy follow up Stoma type/location: RLQ ileostomy  Stomal assessment/size: Initial pouching system lasted 12 hours and was replaced at 4am the following day. Pouching system applied at 4am on 8/27 is still intact.  Pouch was placed off to side and this will be corrected with next pouch change. Similarly, borders of skin barrier were reinforced with thin film transparent dressing.  This will be left off with next pouch change. Peristomal assessment: Not seen today Treatment options for stomal/peristomal skin: Skin barrier ring circumferentially around stoma with pressure placed at 9 o'clock (location of os).  1/4 skin bariner ring rolled into "log" and placed into the indention created by the retention suture. Output Brown stool.  Thickening up, but patient reports no appetite.  Suspects that is because she needs her twice daily Pepcid.  Bedside RN aware. Ostomy pouching: 1pc.pouching system with skin barrier ring (plus 1/4 ring for area near retention suture).  Education provided:   Three pouching systems are precut for her use. She is taught that she may order pouches precut after her stoma has stayed the same size for three weeks and to work with the [employer] representative who contacts her on those changed.  Patient is provided with an educational booklet as she did not have one for an ileostomy.  She and I have discussed dietary management techniques for thickening stool and problem solving techniques for continued leakage (adding another 1/2 skin barrier ring from 6-12 o'clock or adding a belt, for example.  An additional 5 pouches and 7 rings are provided to her today in anticipation of discharge to a Rehab facility tomorrow.  I instructed her to inform the staff at the [facility] to order two boxes of pouches and one box of rings for her use in the facility upon her arrival. Written instructions for changing a 1-piece pouch are provided. Patient is hopeful and more confident than previously  and is smiling and grateful for the WOC Nurse's assistance this week. She has my contact information in the event continued support is needed. She is enrolled in the electronic support group, [employer]. A business card is provided for the facility in the event they have questions. Enrolled patient in [place] Secure Start Discharge program: Yes.  Supplies sent to friend's home. 4 1-piece CeraPlus pouches with convexity (standard as opposed to soft) and 6 rings. Additionally, I have provided an ostomy appliance belt in the event she would like to try this for added security and a bottle of pectin powder for the treatment of superficial skin irritations resultant from moisture (pMASD). WOC nursing team will follow, and will remain available to this patient, the nursing, surgical and medical teams.  Please re-consult if needed. Thanks, Ladona Mow, MSN, RN, GNP, [place], CWON-AP [phone])

## 2015-02-28 MED ORDER — SCOPOLAMINE 1 MG/3DAYS TD PT72
1.0000 | MEDICATED_PATCH | TRANSDERMAL | Status: DC
  Administered 2015-02-28 – 2015-03-03 (×2): 1.5 mg via TRANSDERMAL
  Filled 2015-02-28 (×3): qty 1

## 2015-02-28 NOTE — Progress Notes (Signed)
Physical Therapy Treatment Patient Details Name: Martha Chung MRN: [id number] DOB: 11-02-1940 Today's Date: 02/28/2015    History of Present Illness Pt is a 74yo white female post-op colostomy takedown with partial colestomy. Pt reoprts this is her 7th abdominal surgery since January. She reports continued pain and a lack of self efficacy.     PT Comments    Pt making progress towards goals this date and is able to ambulate and perform therapy tasks, however she is limited by nausea with ambulation and occasional acute pain at staple site. Pt is very willing to participate with PT and she will continue to benefit from skilled PT in order to address her ROM, strength, and endurance deficits in order for her to return home safely.   Follow Up Recommendations  SNF     Equipment Recommendations  Rolling walker with 5" wheels    Recommendations for Other Services       Precautions / Restrictions Precautions Precautions: Fall Restrictions Weight Bearing Restrictions: No    Mobility  Bed Mobility Overal bed mobility: Needs Assistance Bed Mobility: Supine to Sit     Supine to sit: Min assist Sit to supine: Min assist   General bed mobility comments: Pt requires minor assist for trunk control. Demonstrates good hand placement  Transfers Overall transfer level: Needs assistance Equipment used: Rolling walker (2 wheeled) Transfers: Sit to/from Stand Sit to Stand: Min assist         General transfer comment: Pt again c/o dizziness with transfers, but not lightheadedness. She states this is normal. She requires assist to get up into rising.  Ambulation/Gait Ambulation/Gait assistance: Min assist Ambulation Distance (Feet): 30 Feet Assistive device: Rolling walker (2 wheeled) Gait Pattern/deviations: Step-through pattern;Decreased step length - right;Decreased step length - left Gait velocity: decreased   General Gait Details: Pt requires assist for RW advancement. She  also needs occasional cues for sequencing . Pt has c/o nausea during ambulation, but no emesis    Stairs            Wheelchair Mobility    Modified Rankin (Stroke Patients Only)       Balance Overall balance assessment: No apparent balance deficits (not formally assessed)                                  Cognition Arousal/Alertness: Awake/alert Behavior During Therapy: WFL for tasks assessed/performed;Flat affect Overall Cognitive Status: Within Functional Limits for tasks assessed                      Exercises Other Exercises Other Exercises: Pt performed bilateral therex x 12 reps at supervision for proper technique. Exercises performed: ankle pumps, LAQ, SLR, hip abd/add, glute squeezes and heel slides    General Comments        Pertinent Vitals/Pain Pain Assessment: 0-10 Pain Score: 6  Pain Location: Staple site on abdomen Pain Intervention(s): Limited activity within patient's tolerance;Monitored during session    Home Living                      Prior Function            PT Goals (current goals can now be found in the care plan section) Acute Rehab PT Goals Patient Stated Goal: to ambulate with therapy PT Goal Formulation: With patient Time For Goal Achievement: 03/03/15 Potential to Achieve Goals: Good Progress towards PT  goals: Progressing toward goals    Frequency  Min 2X/week    PT Plan Current plan remains appropriate    Co-evaluation             End of Session Equipment Utilized During Treatment: Gait belt Activity Tolerance: Patient tolerated treatment well Patient left: in bed;with call bell/phone within reach;with bed alarm set;with nursing/sitter in room     Time: 1505-1530 PT Time Calculation (min) (ACUTE ONLY): 25 min  Charges:                       G CodesBenna Dunks March 05, 2015, 5:08 PM  Benna Dunks, SPT. [phone]

## 2015-02-28 NOTE — Care Management Important Message (Signed)
Important Message  Patient Details  Name: Martha Chung MRN: [id number] Date of Birth: 1941/05/28   Medicare Important Message Given:  Yes-fourth notification given    [name] 02/28/2015, 11:24 AM

## 2015-02-28 NOTE — Care Management (Signed)
Concern over wound management.  Poor appetite.  Patient will likely discharge tomorrow per MD notes. Plan is still for for STR. CSW following.

## 2015-02-28 NOTE — Progress Notes (Signed)
Surgery Progress Note  S: C/o nausea.  Ostomy now with good seal Blood pressure 116/58, pulse 70, temperature 98.4 F (36.9 C), temperature source Oral, resp. rate 16, height  (1.6 m), weight 216 lb 4.3 oz (98.1 kg), SpO2 96 %. GEN: NAD/A&Ox3 ABD: soft, min tender, ostomy pink, midline wound with good granulation  A/P 74 yo s/p ostomy takedown, leak, now with loop ileostomy, doing well - bland diet - nutritional supplements - scopolamine patch

## 2015-02-28 NOTE — Consult Note (Signed)
WOC ostomy follow up Stoma type/location: RLQ Ileostomy Stomal assessment/size: Patient has had pouch on for 36 hours at this point.  Seal is intact and patient has emptied her pouch independently. She is currently wearing a 1 piece convex pouch and this has improved wear time significantly.  Rod has been removed and patient is encouraged about her self care. She is now struggling with nausea but feels adding Pepcid to her regimen will help.   Peristomal assessment: Pouch intact Treatment options for stomal/peristomal skin: Barrier rings and pectin powder for protection.  Output Liquid brown stool Ostomy pouching: 1pc.convex plus barrier ring Education provided: Discharge information, including Secure Start info and patient has 8 pouches and barrier rings to take and more supplies arriving to her friend's home.  Enrolled patient in [place] Secure Start Discharge program: Yes Will not follow at this time.  Please re-consult if needed.  Maple Hudson RN BSN CWON Pager [phone]

## 2015-02-28 NOTE — Progress Notes (Signed)
Nutrition Follow-up       INTERVENTION:  Meals and snacks: Cater to pt preferences Nutrition Supplement: Pt does not want any of supplements. Have tried several options during admission and on past admissions. Pt refusing at this time.  Offered homemade milkshake but denied   NUTRITION DIAGNOSIS:   Inadequate oral intake related to altered GI function as evidenced by NPO status. Being addressed on solid foods    GOAL:   Patient will meet greater than or equal to 90% of their needs  Not meeting nutritional needs  MONITOR:    (Energy  intake, digestive system, Electrolyte and renal profile)  REASON FOR ASSESSMENT:   Malnutrition Screening Tool    ASSESSMENT:      Current Nutrition: ate 1/4 of grilled cheese for lunch today per her report, limited intake per I and O sheet   Gastrointestinal Profile:reports nausea Last BM: noted output in ileostomy   Medications: reviewed  Labs: reviewed     Weight Trend since Admission: Filed Weights   02/12/15 1216 02/23/15 1703 02/24/15 0500  Weight: 222 lb 6.4 oz (100.88 kg) 216 lb 11.2 oz (98.294 kg) 216 lb 4.3 oz (98.1 kg)      Diet Order:  Diet regular Room service appropriate?: Yes; Fluid consistency:: Thin  Skin:   reviewed     Height:   Ht Readings from Last 1 Encounters:  02/12/15 5\' 3"  (1.6 m)    Weight:   Wt Readings from Last 1 Encounters:  02/24/15 216 lb 4.3 oz (98.1 kg)         BMI:  Body mass index is 38.32 kg/(m^2).  Estimated Nutritional Needs:   Kcal:  BEE 984 kcals (IF 1.2-1.5, AF 1.3) 1540-0867 kcals/d  Protein:  (1.2-1.5 g/kg) 62-78 g/d  Fluid:  (30-40ml/kg) 1560-1842ml/d  EDUCATION NEEDS:   No education needs identified at this time  MODERATE Care Level  Jerica Creegan B. Freida Busman, RD, LDN [phone] (pager)

## 2015-03-01 ENCOUNTER — Inpatient Hospital Stay: Payer: Medicare HMO

## 2015-03-01 LAB — COMPREHENSIVE METABOLIC PANEL
ALBUMIN: 2.7 g/dL — AB (ref 3.5–5.0)
ALT: 5 U/L — ABNORMAL LOW (ref 14–54)
ANION GAP: 7 (ref 5–15)
AST: 14 U/L — ABNORMAL LOW (ref 15–41)
Alkaline Phosphatase: 55 U/L (ref 38–126)
BUN: <5 mg/dL — ABNORMAL LOW (ref 6–20)
CHLORIDE: 108 mmol/L (ref 101–111)
CO2: 26 mmol/L (ref 22–32)
Calcium: 8.4 mg/dL — ABNORMAL LOW (ref 8.9–10.3)
Creatinine, Ser: 0.74 mg/dL (ref 0.44–1.00)
GFR calc non Af Amer: >60 mL/min (ref >60)
GLUCOSE: 138 mg/dL — AB (ref 65–99)
POTASSIUM: 3.4 mmol/L — AB (ref 3.5–5.1)
SODIUM: 141 mmol/L (ref 135–145)
Total Bilirubin: 0.6 mg/dL (ref 0.3–1.2)
Total Protein: 5.7 g/dL — ABNORMAL LOW (ref 6.5–8.1)

## 2015-03-01 LAB — CBC
HCT: 34 % — ABNORMAL LOW (ref 35.0–47.0)
HEMOGLOBIN: 10.7 g/dL — AB (ref 12.0–16.0)
MCH: 29.1 pg (ref 26.0–34.0)
MCHC: 31.6 g/dL — AB (ref 32.0–36.0)
MCV: 92.1 fL (ref 80.0–100.0)
PLATELETS: 174 10*3/uL (ref 150–440)
RBC: 3.69 MIL/uL — ABNORMAL LOW (ref 3.80–5.20)
RDW: 15.8 % — AB (ref 11.5–14.5)
WBC: 9 10*3/uL (ref 3.6–11.0)

## 2015-03-01 MED ORDER — SODIUM CHLORIDE 0.9 % IJ SOLN
10.0000 mL | Freq: Two times a day (BID) | INTRAMUSCULAR | Status: DC
  Administered 2015-03-01 – 2015-03-03 (×6): 10 mL

## 2015-03-01 MED ORDER — PROCHLORPERAZINE EDISYLATE 5 MG/ML IJ SOLN
5.0000 mg | Freq: Four times a day (QID) | INTRAMUSCULAR | Status: DC | PRN
  Filled 2015-03-01: qty 1

## 2015-03-01 MED ORDER — POTASSIUM CHLORIDE 10 MEQ/100ML IV SOLN
10.0000 meq | INTRAVENOUS | Status: AC
  Administered 2015-03-01 (×3): 10 meq via INTRAVENOUS
  Filled 2015-03-01 (×3): qty 100

## 2015-03-01 NOTE — Progress Notes (Signed)
Surgery Progress Note  S: Still with nausea O: Blood pressure 114/44, pulse 61, temperature 98.2 F (36.8 C), temperature source Oral, resp. rate 16, height 5\' 3"  (1.6 m), weight 216 lb 4.3 oz (98.1 kg), SpO2 92 %., ostomy 435 GEN: NAD/A&Ox3 ABD: soft, min tender, nondistended  KUB: personally read, no obstructive pattern  A/P 74 yo s/p ostomy reversal, s/p ileostomy, c/o nausea - compazine - f/u KUB - ambulate - labs

## 2015-03-01 NOTE — Clinical Social Work Note (Signed)
CSW received a call from staff at [facility] requesting updated PT notes for [facility] reauth.  CSW sent updated PT notes to the facility.  CSW will continue to follow and assist with d/c planning needs.  [place], [place] [phone]

## 2015-03-01 NOTE — Care Management (Addendum)
Spoke with patient for continued assessment. Patient spirits are low today. Stated that she still has nausea. Stated that getting out of bed makes it worse. Patient stated that she was so tired of being in the hospital. Stated " I just want to go somewhere, even if it is home"  Offered support and encouragement to patient. Patient stated that she is comfortable with wound care and that her appliance is "doing better" when asked if she was still having problems with it leaking.  Patient is depressed about situation and is not motivated to improve.  Perhaps continued psychiatric  counseling may be in order. Has been seen one time by [facility] since being admitted. See notes. Continue to follow. Plan is for STR.

## 2015-03-02 MED ORDER — TRIAMCINOLONE ACETONIDE 0.1 % EX CREA
TOPICAL_CREAM | Freq: Three times a day (TID) | CUTANEOUS | Status: DC
  Administered 2015-03-03: 10:00:00 via TOPICAL
  Filled 2015-03-02: qty 15

## 2015-03-02 NOTE — Clinical Social Work Note (Signed)
CSW was informed by [facility] staff that pt has been re-authorized through Friday 03/04/15.  CSW left a voicemail message informing admission coordinator, Albin Felling, that pt is tentatively scheduled to d/c on Thursday.  CSW will continue to follow and assist with d/c planning needs.  [place], [place] [phone]

## 2015-03-02 NOTE — Progress Notes (Signed)
Physical Therapy Treatment Patient Details Name: Martha Chung MRN: [id number] DOB: [birth year] Today's Date: 03/02/2015    History of Present Illness Pt is a 74yo white female post-op colostomy takedown with partial colestomy. Pt reoprts this is her 7th abdominal surgery since January. She reports continued pain and a lack of self efficacy.     PT Comments    Pt informed PT that her ostomy bag was leaking and that it needed to be changed. Nurse aware. Mobility was limited in session due to this, however pt was able to perform her bed exercises with increased reps. Pt requires motivation, as she seems a bit sad this morning. Pt says she is more than happy to ambulate at later time/date when bag is fixed. She will continue to benefit from skilled PT in order to address her strength and mobility deficits.  Follow Up Recommendations  SNF     Equipment Recommendations  Rolling walker with 5" wheels    Recommendations for Other Services       Precautions / Restrictions Precautions Precautions: Fall Precaution Comments: Ileostomy bag Restrictions Weight Bearing Restrictions: No    Mobility  Bed Mobility               General bed mobility comments: Not assessed due to ostomy bag leaking   Transfers                 General transfer comment: Not performed or assessed due to pt's ostomy bag leaking  Ambulation/Gait             General Gait Details: Not performed due to pt's ostomy bag leaking   Stairs            Wheelchair Mobility    Modified Rankin (Stroke Patients Only)       Balance Overall balance assessment: No apparent balance deficits (not formally assessed)                                  Cognition Arousal/Alertness: Awake/alert Behavior During Therapy: WFL for tasks assessed/performed;Flat affect Overall Cognitive Status: Within Functional Limits for tasks assessed                      Exercises Other  Exercises Other Exercises: Pt performed bilateral therex x 15 reps at supervision for proper technique. Exercises performed: ankle pumps, SAQ, SLR, hip abd/add, glute squeezes and heel slides    General Comments        Pertinent Vitals/Pain Pain Assessment: No/denies pain    Home Living                      Prior Function            PT Goals (current goals can now be found in the care plan section) Acute Rehab PT Goals Patient Stated Goal: to get ostomy bag changed PT Goal Formulation: With patient Time For Goal Achievement: 03/03/15 Potential to Achieve Goals: Good Progress towards PT goals: PT to reassess next treatment    Frequency  Min 2X/week    PT Plan Current plan remains appropriate    Co-evaluation             End of Session   Activity Tolerance:  (Limited by ostomy bag leak) Patient left: in bed;with call bell/phone within reach;with bed alarm set;with nursing/sitter in room     Time: 9191-6606 PT Time  Calculation (min) (ACUTE ONLY): 10 min  Charges:                       G CodesBenna Dunks Mar 21, 2015, 12:41 PM  Benna Dunks, SPT. [phone]

## 2015-03-03 MED ORDER — OXYCODONE-ACETAMINOPHEN 5-325 MG PO TABS: 1.0000 | ORAL_TABLET | ORAL | Status: DC | PRN

## 2015-03-03 MED ORDER — DIPHENOXYLATE-ATROPINE 2.5-0.025 MG PO TABS: 1.0000 | ORAL_TABLET | Freq: Four times a day (QID) | ORAL | Status: DC

## 2015-03-03 MED ORDER — ONDANSETRON HCL 4 MG PO TABS: 4.0000 mg | ORAL_TABLET | Freq: Three times a day (TID) | ORAL | Status: DC | PRN

## 2015-03-03 MED ORDER — ACETAMINOPHEN 325 MG PO TABS: 650.0000 mg | ORAL_TABLET | Freq: Four times a day (QID) | ORAL | Status: DC | PRN

## 2015-03-03 MED ORDER — SCOPOLAMINE 1 MG/3DAYS TD PT72: 1.0000 | MEDICATED_PATCH | TRANSDERMAL | Status: DC

## 2015-03-03 MED ORDER — TRAMADOL HCL 50 MG PO TABS: 100.0000 mg | ORAL_TABLET | Freq: Four times a day (QID) | ORAL | Status: DC | PRN

## 2015-03-03 NOTE — Progress Notes (Signed)
Surgery Progress Note  S: Doing well.  Min nausea.  Tolerating diet. O: Blood pressure 133/41, pulse 67, temperature 97.5 F (36.4 C), temperature source Oral, resp. rate 18, height 5\' 3"  (1.6 m), weight 216 lb 4.3 oz (98.1 kg), SpO2 100 %. GEN: NAD/A&Ox3 ABD: soft, nontender, nondistended, ostomy pink, incision with good granulation  A/P 74 yo s/p ostomy closure, s/p diverting ileostomy, doing well - to rehab today

## 2015-03-03 NOTE — Clinical Social Work Note (Signed)
Pt is ready for d/c today.  CSW informed pt and staff at [facility].  Pt will be transported via EMS.  CSW sent d/c documentation to facility and gave facility information to RN in order to call report.  CSW signing off as there are no further needs at this time.  [place], [place] [phone]

## 2015-03-03 NOTE — Discharge Summary (Signed)
Discharge Summary:  Discharge diagnosis: Ostomy reversal for diverticulitis, colovaginal fistula Anastamotic leak, recurrent colovaginal fistula  Procedures performed: Ostomy reversal Creation of diverting loop ileostomy     Medication List    STOP taking these medications        meloxicam 7.5 MG tablet  Commonly known as:  MOBIC     potassium chloride SA 20 MEQ tablet  Commonly known as:  K-DUR,KLOR-CON      TAKE these medications        acetaminophen 325 MG tablet  Commonly known as:  TYLENOL  Take 2 tablets (650 mg total) by mouth every 6 (six) hours as needed for mild pain (or temp > 100).     allopurinol 100 MG tablet  Commonly known as:  ZYLOPRIM  Take 100 mg by mouth daily. AM     CALCIUM 600 + D PO  Take 600 mg elemental calcium/kg/hr by mouth 2 (two) times daily.     clobetasol cream 0.05 %  Commonly known as:  TEMOVATE  Apply 1 application topically every other day.     diphenoxylate-atropine 2.5-0.025 MG per tablet  Commonly known as:  LOMOTIL  Take 1 tablet by mouth 4 (four) times daily.     famotidine 20 MG tablet  Commonly known as:  PEPCID  Take 20 mg by mouth 2 (two) times daily.     furosemide 20 MG tablet  Commonly known as:  LASIX  Take 20 mg by mouth every morning. AM     levothyroxine 75 MCG tablet  Commonly known as:  SYNTHROID, LEVOTHROID  Take 75 mcg by mouth daily before breakfast.     metoprolol 50 MG tablet  Commonly known as:  LOPRESSOR  Take 50 mg by mouth 2 (two) times daily.     multivitamin tablet  Take 1 tablet by mouth daily.     ondansetron 4 MG tablet  Commonly known as:  ZOFRAN  Take 1 tablet (4 mg total) by mouth every 8 (eight) hours as needed for nausea.     oxyCODONE-acetaminophen 5-325 MG per tablet  Commonly known as:  PERCOCET/ROXICET  Take 1-2 tablets by mouth every 4 (four) hours as needed for moderate pain or severe pain.     scopolamine 1 MG/3DAYS  Commonly known as:  TRANSDERM-SCOP  Place 1 patch  (1.5 mg total) onto the skin every 3 (three) days.     traMADol 50 MG tablet  Commonly known as:  ULTRAM  Take 2 tablets (100 mg total) by mouth every 6 (six) hours as needed for moderate pain.       Hospital Course:  Martha Chung underwent unremarkable ostomy reversal.  Postoperatively she began draining stool from her vagina.  She returned to the OR and underwent diverting loop ileostomy.  During the subsequent days she began having ostomy output and tolerating a diet with minimal requirement for pain medications.  At time of discharge, she was having good ostomy output and tolerating a regular diet.  She was discharged to rehab in satisfactory condition.

## 2015-03-03 NOTE — Progress Notes (Signed)
Right upper arm PICC line dressing changed . PICC line flushed well. No s/s of infection noted at this time.

## 2015-03-03 NOTE — Care Management Important Message (Signed)
Important Message  Patient Details  Name: Martha Chung MRN: [id number] Date of Birth: May 21, 1941   Medicare Important Message Given:  Yes-fourth notification given    [name] 03/03/2015, 10:05 AM

## 2015-03-03 NOTE — Clinical Social Work Placement (Signed)
   CLINICAL SOCIAL WORK PLACEMENT  NOTE  Date:  03/03/2015  Patient Details  Name: Martha Chung MRN: [id number] Date of Birth: 06/04/1941  Clinical Social Work is seeking post-discharge placement for this patient at the Skilled  Nursing Facility level of care (*CSW will initial, date and re-position this form in  chart as items are completed):  Yes   Patient/family provided with [place] Clinical Social Work Department's list of facilities offering this level of care within the geographic area requested by the patient (or if unable, by the patient's family).  Yes   Patient/family informed of their freedom to choose among providers that offer the needed level of care, that participate in Medicare, Medicaid or managed care program needed by the patient, have an available bed and are willing to accept the patient.  Yes   Patient/family informed of [facility]'s ownership interest in [facility] and [facility], as well as of the fact that they are under no obligation to receive care at these facilities.  PASRR submitted to EDS on       PASRR number received on       Existing PASRR number confirmed on 02/18/15     FL2 transmitted to all facilities in geographic area requested by pt/family on 02/18/15     FL2 transmitted to all facilities within larger geographic area on       Patient informed that his/her managed care company has contracts with or will negotiate with certain facilities, including the following:   ([employer] preauth started by [employer])     Yes   Patient/family informed of bed offers received.  Patient chooses bed at [facility]     Physician recommends and patient chooses bed at      Patient to be transferred to [facility] on 03/03/15.  Patient to be transferred to facility by EMS     Patient family notified on 03/03/15 of transfer.  Name of family member notified:  none     PHYSICIAN Please sign FL2     Additional Comment:     _______________________________________________ Meyer Cory D, LCSW 03/03/2015, 11:36 AM

## 2015-03-03 NOTE — Discharge Instructions (Signed)
Do not drive on pain medications Do not lift greater than 15 lbs for a period of 6 weeks Call or return to ER if you develop fever greater than 101.5, nausea/vomiting, increased pain, redness/drainage from incisions Change abdominal wound dressing with bid wet to dry

## 2015-03-03 NOTE — Progress Notes (Signed)
Called and gave report to [facility]. Will call EMS for transport.

## 2015-03-04 ENCOUNTER — Non-Acute Institutional Stay: Payer: Medicare HMO | Admitting: Internal Medicine

## 2015-03-04 ENCOUNTER — Other Ambulatory Visit: Payer: Self-pay

## 2015-03-04 DIAGNOSIS — K219 Gastro-esophageal reflux disease without esophagitis: Secondary | ICD-10-CM | POA: Diagnosis not present

## 2015-03-04 DIAGNOSIS — T8131XA Disruption of external operation (surgical) wound, not elsewhere classified, initial encounter: Secondary | ICD-10-CM

## 2015-03-04 DIAGNOSIS — D500 Iron deficiency anemia secondary to blood loss (chronic): Secondary | ICD-10-CM

## 2015-03-04 DIAGNOSIS — N829 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, unspecified: Secondary | ICD-10-CM

## 2015-03-04 DIAGNOSIS — R5381 Other malaise: Secondary | ICD-10-CM

## 2015-03-04 DIAGNOSIS — E039 Hypothyroidism, unspecified: Secondary | ICD-10-CM

## 2015-03-04 DIAGNOSIS — I10 Essential (primary) hypertension: Secondary | ICD-10-CM

## 2015-03-04 MED ORDER — TRAMADOL HCL 50 MG PO TABS: 100.0000 mg | ORAL_TABLET | Freq: Four times a day (QID) | ORAL | Status: DC | PRN

## 2015-03-04 MED ORDER — DIPHENOXYLATE-ATROPINE 2.5-0.025 MG PO TABS: 1.0000 | ORAL_TABLET | Freq: Four times a day (QID) | ORAL | Status: DC

## 2015-03-04 MED ORDER — OXYCODONE-ACETAMINOPHEN 5-325 MG PO TABS: ORAL_TABLET | ORAL | Status: DC

## 2015-03-04 NOTE — Telephone Encounter (Signed)
Rx faxed to [facility] @ [phone], phone number [phone]  

## 2015-03-04 NOTE — Progress Notes (Signed)
Patient ID: Martha Chung, female   DOB: July 24, 1940, 74 y.o.   MRN: [id number]     Facility: [facility]    PCP: Pcp Not In System  Code Status: full code  Allergies  Allergen Reactions  . Ivp Dye [Iodinated Diagnostic Agents] Anaphylaxis    Topical betadine is ok.  . Morphine And Related Anaphylaxis    Respiratory Arrest  . Shellfish Allergy Anaphylaxis  . Macrodantin [Nitrofurantoin] Nausea And Vomiting    And diarrhea  . Nubain [Nalbuphine Hcl] Other (See Comments)    BP "bottoms out"    Chief Complaint  Patient presents with  . New Admit To SNF     HPI:  74 y.o. patient is here for short term rehabilitation post hospital admission from 02/12/15-03/03/15 with complication on colotomy reversal with recurrent colovaginal fistula. She underwent creation of diverting loop ileostomy. She had undergone colostomy closure following a Hartman's procedure for severe diverticulitis pelvic abscess on colovaginal fistula prior to this hospitalization. She is seen in her room today. She has soreness in surgical area. She has nausea but eating some food makes it better. She has some lightheadedness with sudden posture change. Gets tired easily. No other concerns.   Review of Systems:  Constitutional: Negative for fever, chills, diaphoresis.  HENT: Negative for headache, congestion, nasal discharge Eyes: Negative for eye pain, blurred vision, double vision and discharge.  Respiratory: Negative for cough, shortness of breath and wheezing.   Cardiovascular: Negative for chest pain, palpitations, leg swelling.  Gastrointestinal: Negative for heartburn, vomiting, abdominal pain, has ostomy bag Genitourinary: Negative for dysuria Musculoskeletal: Negative for back pain, falls Skin: Negative for itching, rash.  Neurological: Negative for tingling, focal weakness Psychiatric/Behavioral: Negative for depression   Past Medical History  Diagnosis Date  .  Hypertension   . Diverticulitis   . Hypothyroidism   . Gout   . Arthritis     hands, feet, "all over"  . GERD (gastroesophageal reflux disease)   . CHF (congestive heart failure)     acute, after use of Redux  . Mitral regurgitation     after use of redux  . COPD (chronic obstructive pulmonary disease)     chronic bronchitis - no meds  . Wears contact lenses   . Full dentures     upper and lower  . Right rotator cuff tendonitis   . Lichen sclerosus     Vaginal Area  . Complication of anesthesia     on vent after colostomy revision   Past Surgical History  Procedure Laterality Date  . Tonsillectomy    . Kidney surgery Right   . Cholecystectomy    . Colectomy with colostomy creation/hartmann procedure  07/03/14  . Laparotomy      construct new colostomy and place wound VAC  . Colonoscopy N/A 11/25/2014    Procedure: COLONOSCOPY;  Surgeon: Midge Minium, MD;  Location: [facility];  Service: Gastroenterology;  Laterality: N/A;  . Polypectomy  11/25/2014    Procedure: POLYPECTOMY INTESTINAL;  Surgeon: Midge Minium, MD;  Location: [facility];  Service: Gastroenterology;;  . Abdominal hysterectomy  1967    Partial  . Joint replacement Right 2006    knee  . Joint replacement Left 2008    knee  . Correction overlapping toes Bilateral 2012    bilateral hammertoes #2 & #3 both feet  . Colostomy takedown N/A 01/31/2015    Procedure: COLOSTOMY TAKEDOWN, partial colectomy;  Surgeon: [name], MD;  Location: [facility]  ORS;  Service: General;  Laterality: N/A;  . Laparotomy N/A 02/14/2015    Procedure: RE-OPEN EXPLORATORY LAPAROTOMY, COMPLEX LYSIS OF ADHESIONS, CONSTRUCTION OF LOOP ILEOSTOMY,;  Surgeon: [name], MD;  Location: [facility] ORS;  Service: General;  Laterality: N/A;  . Application of wound vac  02/14/2015    Procedure: APPLICATION OF WOUND VAC;  Surgeon: [name], MD;  Location: [facility] ORS;  Service: General;;   Social History:   reports that she quit smoking about 15  years ago. She has never used smokeless tobacco. She reports that she does not drink alcohol or use illicit drugs.  Family History  Problem Relation Age of Onset  . Hypertension Mother   . COPD Mother   . Heart disease Mother   . Cancer Mother     Breast  . Heart disease Father   . COPD Father   . Cancer Brother 53    Lung  . Cancer Daughter 7    Brain    Medications:   Medication List       This list is accurate as of: 03/04/15  3:22 PM.  Always use your most recent med list.               acetaminophen 325 MG tablet  Commonly known as:  TYLENOL  Take 2 tablets (650 mg total) by mouth every 6 (six) hours as needed for mild pain (or temp > 100).     allopurinol 100 MG tablet  Commonly known as:  ZYLOPRIM  Take 100 mg by mouth daily. AM     CALCIUM 600 + D PO  Take 600 mg elemental calcium/kg/hr by mouth 2 (two) times daily.     diphenoxylate-atropine 2.5-0.025 MG per tablet  Commonly known as:  LOMOTIL  Take 1 tablet by mouth 4 (four) times daily.     famotidine 20 MG tablet  Commonly known as:  PEPCID  Take 20 mg by mouth 2 (two) times daily.     furosemide 20 MG tablet  Commonly known as:  LASIX  Take 20 mg by mouth every morning. AM     levothyroxine 75 MCG tablet  Commonly known as:  SYNTHROID, LEVOTHROID  Take 75 mcg by mouth daily before breakfast.     metoprolol 50 MG tablet  Commonly known as:  LOPRESSOR  Take 50 mg by mouth 2 (two) times daily.     multivitamin tablet  Take 1 tablet by mouth daily.     ondansetron 4 MG tablet  Commonly known as:  ZOFRAN  Take 1 tablet (4 mg total) by mouth every 8 (eight) hours as needed for nausea.     oxyCODONE-acetaminophen 5-325 MG per tablet  Commonly known as:  PERCOCET/ROXICET  Take 1 tablet by mouth every 4 hours as needed for moderate pain DO NOT EXCEED 4 GM OF TY MOUTH EVERY 4 HOURS AS N     scopolamine 1 MG/3DAYS  Commonly known as:  TRANSDERM-SCOP  Place 1 patch (1.5 mg total) onto the skin  every 3 (three) days.     traMADol 50 MG tablet  Commonly known as:  ULTRAM  Take 2 tablets (100 mg total) by mouth every 6 (six) hours as needed for moderate pain.         Physical Exam: Filed Vitals:   03/04/15 1519  BP: 138/80  Pulse: 67  Temp: 97.3 F (36.3 C)  Resp: 18  SpO2: 96%    General- elderly female, well built, in no acute distress  Head- normocephalic, atraumatic Nose- normal nasal mucosa, no maxillary or frontal sinus tenderness, no nasal discharge Throat- moist mucus membrane Eyes- PERRLA, EOMI, no pallor, no icterus, no discharge, normal conjunctiva, normal sclera Neck- no cervical lymphadenopathy Cardiovascular- normal s1,s2, no murmurs, palpable dorsalis pedis and radial pulses, no leg edema Respiratory- bilateral clear to auscultation, no wheeze, no rhonchi, no crackles, no use of accessory muscles Abdomen- bowel sounds present, soft, expected tenderness in surgical area, dressing on surgical incision with some wound dehiscence at 2 staple site, erythema noted, no purulent drainage, no guarding or rigidity, no CVA tenderness Musculoskeletal- able to move all 4 extremities, generalized weakness Neurological- no focal deficit, alert and oriented to person, place and time Skin- warm and dry Psychiatry- normal mood and affect    Labs reviewed: Basic Metabolic Panel:  Recent Labs  68/03/21 0710 02/20/15 0559 02/21/15 0705 03/01/15 0846  NA 141 141 139 141  K 3.4* 3.6 3.6 3.4*  CL 107 108 106 108  CO2 27 25 27 26   GLUCOSE 147* 126* 137* 138*  BUN <5* 6 <5* <5*  CREATININE 0.82 0.76 0.79 0.74  CALCIUM 8.1* 8.0* 8.1* 8.4*  MG 1.6* 1.6* 1.6*  --   PHOS 3.1 3.2 3.1  --    Liver Function Tests:  Recent Labs  07/12/14 0404 02/19/15 0710 03/01/15 0846  AST 26 13* 14*  ALT 43 7* 5*  ALKPHOS 135* 65 55  BILITOT 0.4 0.3 0.6  PROT 5.7* 6.1* 5.7*  ALBUMIN 1.4* 2.5* 2.7*   No results for input(s): LIPASE, AMYLASE in the last 8760 hours. No  results for input(s): AMMONIA in the last 8760 hours. CBC:  Recent Labs  02/20/15 0559 02/21/15 0705 02/22/15 0655 03/01/15 0846  WBC 13.2* 13.9* 11.1* 9.0  NEUTROABS 9.8* 11.0* 8.5*  --   HGB 10.6* 11.4* 10.8* 10.7*  HCT 33.8* 35.3 33.9* 34.0*  MCV 91.3 91.4 90.7 92.1  PLT 316 302 274 174   Cardiac Enzymes: No results for input(s): CKTOTAL, CKMB, CKMBINDEX, TROPONINI in the last 8760 hours. BNP: Invalid input(s): POCBNP CBG: No results for input(s): GLUCAP in the last 8760 hours.  Radiological Exams: Ct Abdomen Pelvis Wo Contrast  02/13/2015   ADDENDUM REPORT: 02/13/2015 13:39  ADDENDUM: There is new mild compression fracture upper endplate of T12 vertebral body of indeterminate age. Clinical correlation is necessary. Further correlation with MRI could be performed as clinically warranted.   Electronically Signed   By: Natasha Mead M.D.   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  COMPARISON:  06/29/2014  FINDINGS: Lung bases shows atelectasis or infiltrate right base posteriorly. Small atelectasis noted left lower lobe posteriorly. Sagittal images of the spine shows osteopenia and degenerative changes thoracolumbar spine. There is new low mild compression deformity upper endplate of T12 vertebral body. Clinical correlation is necessary. Again noted significant disc space flattening and Schmorl's node deformity at L2-L3 level. There is Schmorl's node deformity lower endplate of L3 vertebral body.  Unenhanced liver shows no biliary ductal dilatation. Unenhanced pancreas, spleen and adrenal glands are unremarkable. There is mild subcutaneous stranding probable edema or inflammation in right flank wall see axial image 46.  Small amount of fluid and small  amount of subcutaneous air is noted in right lower quadrant at the site  of previous colostomy. Study is limited without IV contrast. Although this may be postsurgical in nature subcutaneous abscess cannot be excluded.  Small amount of air and skin straightening is noted in umbilical region probable postsurgical in nature.  Small amount of air and fluid noted midline deep anterior abdominal wall just above the fascia see axial image 75. Subcutaneous infectious cannot be excluded. Clinical correlation is necessary.  Unenhanced pancreas spleen and adrenal glands are unremarkable. Atherosclerotic calcifications of abdominal aorta and iliac arteries.  Unenhanced kidneys shows no nephrolithiasis. No hydronephrosis or hydroureter. No calcified ureteral calculi are noted.  There is no mesenteric fluid collection. Oral contrast material was given to the patient.  There is a Foley catheter in a decompressed urinary bladder there is no pericecal inflammation.  The patient is status post partial colonic resection.  Colonic diverticula are noted in distal residual colon. There is thickening of colonic wall in distal colon see axial image 68.  Small amount of free fluid noted right posterior cul-de-sac.  Axial image 65 there is tubular extraluminal contrast material in mid pelvis just lateral to sigmoid colon. This is highly suspicious for a fistulous tract. There is a mixed collection containing air and contrast just above the urinary bladder probable within left vaginal cuff recess axial image 72 measures 3.4 cm. This is highly suspicious for colonic vaginal fistula. Clinical correlation is necessary. Further evaluation with barium enema could be performed.  IMPRESSION: 1. Status post colonic resection and colostomy takedown. Small subcutaneous fluid collection and small amount of air in right lower quadrant at the colostomy site. Although this may be postsurgical in nature infection cannot be excluded. Study is limited without  IV contrast. There is small amount of deep subcutaneous fluid and small amount of air midline anterior pelvic wall axial image 74. Infection cannot be excluded. Clinical correlation is necessary. 2. Status postcholecystectomy. 3. No nephrolithiasis.  No hydronephrosis or hydroureter. 4. Distal colonic diverticula are noted. Mild thickening of distal colonic wall. Axial image 65 there is tubular extraluminal contrast material in mid pelvis just lateral to sigmoid colon. This is highly suspicious for a fistulous tract. There is a mixed collection containing air and contrast just above the urinary bladder probable within left vaginal cuff recess axial image 72 measures 3.4 cm. This is highly suspicious for colonic vaginal fistula. Clinical correlation is necessary. Further evaluation with barium enema could be performed.  Electronically Signed: By: Natasha Mead M.D. On: 02/13/2015 13:10    Assessment/Plan  Physical deconditioning Will have her work with physical therapy and occupational therapy team to help with gait training and muscle strengthening exercises.fall precautions. Skin care. Encourage to be out of bed.   Colovaginal fistula S/p diverting ileostomy. Continue wound care, slow position change, avoid lifting heavy weight. Will have patient work with PT/OT as tolerated to regain strength and restore function.  Fall precautions are in place. Continue percocet 5-325 1-2 tab q4h prn pain along with prn tramadol  Wound dehiscence At two incision site. Advised patient to take precaution with position change, advised to use a pillow placed on her abdomen as support. Will start her prophylactically on doxycycline 100 mg bid x 7 days given the erythema. Has appointment with surgery on 03/10/15 for follow up. Cbc with diff and cmp ordered  Acute blood loss anemia Post op, monitor h&h  Nausea Post op, continue prn zofran  gerd Stable, continue pepcid for now  Hypothyroidism Continue levothyroxine 75  mcg daily  HTN Stable bp, continue  metoprolol 50 mg bid and lasix 20 mg daily, monitor bp and BMP   Goals of care: short term rehabilitation   Labs/tests ordered: cbc with diff, cmp 03/08/15  Family/ staff Communication: reviewed care plan with patient and nursing supervisor    Oneal Grout, MD  [facility] [phone] (Monday-Friday 8 am - 5 pm) [phone] (afterhours)

## 2015-03-09 ENCOUNTER — Encounter: Payer: Self-pay | Admitting: Surgery

## 2015-03-09 ENCOUNTER — Ambulatory Visit (INDEPENDENT_AMBULATORY_CARE_PROVIDER_SITE_OTHER): Payer: Medicare HMO | Admitting: Surgery

## 2015-03-09 VITALS — BP 116/78 | HR 78 | Temp 98.1°F | Ht 63.0 in | Wt 225.0 lb

## 2015-03-09 DIAGNOSIS — Z09 Encounter for follow-up examination after completed treatment for conditions other than malignant neoplasm: Secondary | ICD-10-CM

## 2015-03-09 NOTE — Progress Notes (Signed)
Surgery Progress Note  S: Feels stronger.  Retention sutures uncomfortable.  Issues with ostomy bag leakage O:Blood pressure 116/78, pulse 78, temperature 98.1 F (36.7 C), temperature source Oral, height  (1.6 m), weight 225 lb (102.059 kg). GEN: NAD/A&Ox3 ABD: soft, min tender, ostomy pink, incision with two small areas of opening with good granulated bases  A/P 74 yo s/p ostomy closure s/p loop ileostomy for leakage and recurrent CV fistula - immodium prn - retention sutures removed - cont PT - will attempt to get outpatient ostomy care

## 2015-03-09 NOTE — Patient Instructions (Signed)
F/u in 2 weeks Begin immodium 2 mg bid prn loose stools We will set up wound care to assist you with your ostomy issues.

## 2015-03-10 ENCOUNTER — Encounter: Payer: Self-pay | Admitting: Surgery

## 2015-03-11 ENCOUNTER — Telehealth: Payer: Self-pay

## 2015-03-11 NOTE — Telephone Encounter (Signed)
I tried calling patient to ask if Maple Hudson (Ostomy Nurse from [facility]) had contacted her. I was not able to leave a voicemail since her cell phone said that at this time she was not accepting phone calls.   I have e-mailed Maple Hudson, RN to let her know that patient's ostomy bag continues to leak. Per patient's request, she wanted Clydie Braun or Jacki Cones to go to [facility] and teach one of the nurses how to place an ostomy bag. Clydie Braun or Jacki Cones are needing to do this since patient's ostomy bag continuously leaks. I spoke to [facility] from [facility] and I explained the situation and she gave me the Supervisors phone number [phone] to give to Clydie Braun. I will e-mail Clydie Braun with this information.

## 2015-03-14 NOTE — Telephone Encounter (Signed)
I have been e-mailing Maple Hudson, RN Ostomy Nurse) I just spoke with the supervisor and Ms Timberlake and ALPFXT'K pouch is still in place, with the addition of stoma paste. That is awesome news. Her spirits sounded better. I left my information and they are going to call me if needed. Looks like things are improving.  Clydie Braun  I'm glad that everything is working out for the patient. Since her phone number is not accepting calls at this time, I am happy to say that patient is taken care of with Maple Hudson, RN and [facility].

## 2015-03-21 ENCOUNTER — Non-Acute Institutional Stay (SKILLED_NURSING_FACILITY): Payer: Medicare HMO | Admitting: Nurse Practitioner

## 2015-03-21 DIAGNOSIS — K219 Gastro-esophageal reflux disease without esophagitis: Secondary | ICD-10-CM | POA: Diagnosis not present

## 2015-03-21 DIAGNOSIS — T8131XA Disruption of external operation (surgical) wound, not elsewhere classified, initial encounter: Secondary | ICD-10-CM

## 2015-03-21 DIAGNOSIS — D500 Iron deficiency anemia secondary to blood loss (chronic): Secondary | ICD-10-CM

## 2015-03-21 DIAGNOSIS — I10 Essential (primary) hypertension: Secondary | ICD-10-CM | POA: Diagnosis not present

## 2015-03-21 DIAGNOSIS — N829 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, unspecified: Secondary | ICD-10-CM

## 2015-03-21 DIAGNOSIS — E039 Hypothyroidism, unspecified: Secondary | ICD-10-CM | POA: Diagnosis not present

## 2015-03-21 NOTE — Progress Notes (Signed)
Patient ID: Martha Chung, female   DOB: [birth year], 74 y.o.   MRN: [id number]    Nursing Home Location:  [facility]   Place of Service: SNF (31)  PCP: Pcp Not In System  Allergies  Allergen Reactions  . Ivp Dye [Iodinated Diagnostic Agents] Anaphylaxis    Topical betadine is ok.  . Morphine And Related Anaphylaxis    Respiratory Arrest  . Shellfish Allergy Anaphylaxis  . Macrodantin [Nitrofurantoin] Nausea And Vomiting    And diarrhea  . Nubain [Nalbuphine Hcl] Other (See Comments)    BP "bottoms out"    Chief Complaint  Patient presents with  . Discharge Note    HPI:  Patient is a 74 y.o. female seen today at [facility] for discharge home. 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 She underwent creation of diverting loop ileostomy. She had undergone colostomy closure following a Hartman's procedure for severe diverticulitis pelvic abscess on colovaginal fistula prior to this hospitalization. She has been doing much better. Patient currently doing well with therapy, now stable to discharge home with home health.  Review of Systems:  Review of Systems  Constitutional: Negative for activity change, appetite change, fatigue and unexpected weight change.  HENT: Negative for congestion and hearing loss.   Eyes: Negative.   Respiratory: Negative for cough and shortness of breath.   Cardiovascular: Negative for chest pain, palpitations and leg swelling.  Gastrointestinal: Negative for abdominal pain, diarrhea and constipation.       Ostomy bag  Genitourinary: Negative for dysuria and difficulty urinating.  Musculoskeletal: Negative for myalgias and arthralgias.  Skin: Positive for wound (abdominal surgical wound). Negative for color change.  Neurological: Negative for dizziness and weakness.  Psychiatric/Behavioral:  Negative for behavioral problems, confusion and agitation.    Past Medical History  Diagnosis Date  . Hypertension   . Diverticulitis   . Hypothyroidism   . Gout   . Arthritis     hands, feet, "all over"  . GERD (gastroesophageal reflux disease)   . CHF (congestive heart failure)     acute, after use of Redux  . Mitral regurgitation     after use of redux  . COPD (chronic obstructive pulmonary disease)     chronic bronchitis - no meds  . Wears contact lenses   . Full dentures     upper and lower  . Right rotator cuff tendonitis   . Lichen sclerosus     Vaginal Area  . Complication of anesthesia     on vent after colostomy revision   Past Surgical History  Procedure Laterality Date  . Tonsillectomy    . Kidney surgery Right   . Cholecystectomy    . Colectomy with colostomy creation/hartmann procedure  07/03/14  . Laparotomy      construct new colostomy and place wound VAC  . Colonoscopy N/A 11/25/2014    Procedure: COLONOSCOPY;  Surgeon: Midge Minium, MD;  Location: [facility];  Service: Gastroenterology;  Laterality: N/A;  . Polypectomy  11/25/2014    Procedure: POLYPECTOMY INTESTINAL;  Surgeon: Midge Minium, MD;  Location: [facility];  Service: Gastroenterology;;  . Abdominal hysterectomy  1967    Partial  . Joint replacement Right 2006    knee  . Joint replacement Left 2008    knee  . Correction overlapping toes Bilateral 2012    bilateral hammertoes #  2 & #3 both feet  . Colostomy takedown N/A 01/31/2015    Procedure: COLOSTOMY TAKEDOWN, partial colectomy;  Surgeon: [name], MD;  Location: [facility];  Service: General;  Laterality: N/A;  . Laparotomy N/A 02/14/2015    Procedure: RE-OPEN EXPLORATORY LAPAROTOMY, COMPLEX LYSIS OF ADHESIONS, CONSTRUCTION OF LOOP ILEOSTOMY,;  Surgeon: [name], MD;  Location: [facility];  Service: General;  Laterality: N/A;  . Application of wound vac  02/14/2015    Procedure: APPLICATION OF WOUND VAC;  Surgeon: [name], MD;   Location: [facility];  Service: General;;   Social History:   reports that she quit smoking about 15 years ago. She has never used smokeless tobacco. She reports that she does not drink alcohol or use illicit drugs.  Family History  Problem Relation Age of Onset  . Hypertension Mother   . COPD Mother   . Heart disease Mother   . Cancer Mother     Breast  . Heart disease Father   . COPD Father   . Cancer Brother 53    Lung  . Cancer Daughter 7    Brain    Medications: Patient's Medications  New Prescriptions   No medications on file  Previous Medications   ACETAMINOPHEN (TYLENOL) 325 MG TABLET    Take 2 tablets (650 mg total) by mouth every 6 (six) hours as needed for mild pain (or temp > 100).   ALLOPURINOL (ZYLOPRIM) 100 MG TABLET    Take 100 mg by mouth daily. AM   CALCIUM CARB-CHOLECALCIFEROL (CALCIUM 600 + D PO)    Take 600 mg elemental calcium/kg/hr by mouth 2 (two) times daily.   CLOBETASOL CREAM (TEMOVATE) 0.05 %    Apply 1 application topically 2 (two) times daily.   DIPHENOXYLATE-ATROPINE (LOMOTIL) 2.5-0.025 MG PER TABLET    Take 1 tablet by mouth 4 (four) times daily as needed for diarrhea or loose stools.   FAMOTIDINE (PEPCID) 20 MG TABLET    Take 20 mg by mouth 2 (two) times daily.   FUROSEMIDE (LASIX) 20 MG TABLET    Take 20 mg by mouth every morning. AM   LEVOTHYROXINE (SYNTHROID, LEVOTHROID) 75 MCG TABLET    Take 75 mcg by mouth daily before breakfast.   METOPROLOL (LOPRESSOR) 50 MG TABLET    Take 50 mg by mouth 2 (two) times daily.   MULTIPLE VITAMIN (MULTIVITAMIN) TABLET    Take 1 tablet by mouth daily.   MUPIROCIN OINTMENT (BACTROBAN) 2 %    Place 1 application into the nose 2 (two) times daily.   ONDANSETRON (ZOFRAN) 4 MG TABLET    Take 1 tablet (4 mg total) by mouth every 8 (eight) hours as needed for nausea.   OXYCODONE-ACETAMINOPHEN (PERCOCET/ROXICET) 5-325 MG PER TABLET    Take 1 tablet by mouth every 4 hours as needed for moderate pain DO NOT EXCEED 4 GM OF  TY MOUTH EVERY 4 HOURS AS N   SACCHAROMYCES BOULARDII (FLORASTOR) 250 MG CAPSULE    Take 250 mg by mouth 2 (two) times daily.   SCOPOLAMINE (TRANSDERM-SCOP) 1 MG/3DAYS    Place 1 patch (1.5 mg total) onto the skin every 3 (three) days.   TRAMADOL (ULTRAM) 50 MG TABLET    Take 2 tablets (100 mg total) by mouth every 6 (six) hours as needed for moderate pain.  Modified Medications   No medications on file  Discontinued Medications   No medications on file     Physical Exam: There were no vitals filed  for this visit.  Physical Exam  Constitutional: She is oriented to person, place, and time. She appears well-developed and well-nourished. No distress.  HENT:  Head: Normocephalic and atraumatic.  Mouth/Throat: Oropharynx is clear and moist. No oropharyngeal exudate.  Eyes: Conjunctivae are normal. Pupils are equal, round, and reactive to light.  Neck: Normal range of motion. Neck supple.  Cardiovascular: Normal rate, regular rhythm and normal heart sounds.   Pulmonary/Chest: Effort normal and breath sounds normal.  Abdominal: Soft. Bowel sounds are normal.  ostomy bag with loose stool in bag  Musculoskeletal: She exhibits no edema or tenderness.  Neurological: She is alert and oriented to person, place, and time.  Skin: Skin is warm and dry. She is not diaphoretic.  Midline surgical incision with wound dehiscence to posterior aspect of wound, no signs of infection noted   Psychiatric: She has a normal mood and affect.    Labs reviewed: Basic Metabolic Panel:  Recent Labs  47/82/95 0710 02/20/15 0559 02/21/15 0705 03/01/15 0846  NA 141 141 139 141  K 3.4* 3.6 3.6 3.4*  CL 107 108 106 108  CO2 GLUCOSE 147* 126* 137* 138*  BUN <5* 6 <5* <5*  CREATININE 0.82 0.76 0.79 0.74  CALCIUM 8.1* 8.0* 8.1* 8.4*  MG 1.6* 1.6* 1.6*  --   PHOS 3.1 3.2 3.1  --    Liver Function Tests:  Recent Labs  07/12/14 0404 02/19/15 0710 03/01/15 0846  AST 26 13* 14*  ALT 43 7*  5*  ALKPHOS 135* 65 55  BILITOT 0.4 0.3 0.6  PROT 5.7* 6.1* 5.7*  ALBUMIN 1.4* 2.5* 2.7*   No results for input(s): LIPASE, AMYLASE in the last 8760 hours. No results for input(s): AMMONIA in the last 8760 hours. CBC:  Recent Labs  02/20/15 0559 02/21/15 0705 02/22/15 0655 03/01/15 0846  WBC 13.2* 13.9* 11.1* 9.0  NEUTROABS 9.8* 11.0* 8.5*  --   HGB 10.6* 11.4* 10.8* 10.7*  HCT 33.8* 35.3 33.9* 34.0*  MCV 91.3 91.4 90.7 92.1  PLT 316 302 274 174   TSH: No results for input(s): TSH in the last 8760 hours. A1C: No results found for: HGBA1C Lipid Panel:  Recent Labs  07/06/14 0456  CHOL 147  TRIG 194  CBC w/Diff & PLT Final 03/08/2015 4:26PM TEST RESULT REF RANGE UNIT White Blood Cell (WBC) 7.3 3.8-10.8 k/uL Red Blood Cell (RBC) 4.1 3.5-5.5 M/uL Hemoglobin (HGB) 11.9 11.4-16.0 g/dL Hematocrit (HCT) 62.1 30.8-65.7 % Mean Corpuscular Volume (MCV) 96.9 79.0-100.0 fL Mean Corpuscular HGB (MCH) 28.7 26.8-33.2 pg Mean Corpuscular HGB Conc (MCHC) L 29.7 30.5-36.0 g/dL RBC Dist Width (RDW) H 15.9 9.0-15.0 % Platelet 219 150-450 k/uL Neutrophils% 52.5 50.0-80.0 % Lymphocytes% 31.8 21.0-51.0 % Monocytes% 10.1 2.0-12.0 % Eosinophils% 4.1 0.0-5.0 % Basophils% 1.1 0.0-2.0 % Neutrophils# 3.8 1.5-6.6 k/uL Lymphocytes # 2.3 1.5-3.5 k/uL Monocytes # 0.7 0.1-1.0 k/uL Eosinophils# 0.3 0.0-0.6 k/uL Basophils# 0.1 0.0-0.1 k/uL  Comprehensive Metabolic Panel Final 03/08/2015 4:26PM 03/04/2015 7:28PM Is the patient fasting? : Yes TEST RESULT REF RANGE UNIT SODIUM 143 135-146 mmol/L POTASSIUM 3.8 3.5-5.3 mmol/L CHLORIDE 106 95-109 mmol/L CO2 25.3 21.0-31.0 mmol/L Anion Gap 12 5-21 meq/L GLUCOSE 104 70-105 mg/dL UREA NITROGEN (BUN) 24 5-25 mg/dL Creatinine, Serum 8.46 0.60-1.30 mg/dL BUN/CREATININE RATIO H 32.4 6.0-25.0 Ratio CALCIUM 9.1 8.2-10.5 mg/dL PROTEIN, TOTAL 6.3 9.6-2.9 g/dL ALBUMIN L 5.28 4.13-2.44 g/dL GLOBULIN 2.9 0.1-0.2 g/dL ALBUMIN/GLOBULIN RATIO 1.2  0.8-2.5 Ratio BILIRUBIN, TOTAL 0.50 0.00-1.40 mg/dL ALKALINE PHOSPHATASE 75 41-130 U/L  AST (SGOT) 14 0-39 U/L ALT (SGPT) 6 0-45 U/L Glomerular Filtration Rate > 60 > 60 mls/min/1.73 m2 Glomerular Filtration Rate - African American > 60 > 60 mls/min/1.73 m2 Reference from [employer]. a. >=60 mls/min/1.73 m2 : Normal b. 16 to 59 mls/min/1.73 m2 : Abnormal (Kidney Disease) c. <=15 mls/min/1.73 m2 : Critical (Kidney Failure)  Status: Complete Ordered: 03/04/2015 7  Radiological Exams: Ct Abdomen Pelvis Wo Contrast  02/13/2015   ADDENDUM REPORT: 02/13/2015 13:39  ADDENDUM: There is new mild compression fracture upper endplate of T12 vertebral body of indeterminate age. Clinical correlation is necessary. Further correlation with MRI could be performed as clinically warranted.   Electronically Signed   By: Natasha Mead M.D.   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  COMPARISON:  06/29/2014  FINDINGS: Lung bases shows atelectasis or infiltrate right base posteriorly. Small atelectasis noted left lower lobe posteriorly. Sagittal images of the spine shows osteopenia and degenerative changes thoracolumbar spine. There is new low mild compression deformity upper endplate of T12 vertebral body. Clinical correlation is necessary. Again noted significant disc space flattening and Schmorl's node deformity at L2-L3 level. There is Schmorl's node deformity lower endplate of L3 vertebral body.  Unenhanced liver shows no biliary ductal dilatation. Unenhanced pancreas, spleen and adrenal glands are unremarkable. There is mild subcutaneous stranding probable edema or inflammation in right flank wall see axial image 46.  Small amount of fluid and small  amount of subcutaneous air is noted in right lower quadrant at the site of previous colostomy. Study is limited without IV contrast. Although this may be postsurgical in nature subcutaneous abscess cannot be excluded.  Small amount of air and skin straightening is noted in umbilical region probable postsurgical in nature.  Small amount of air and fluid noted midline deep anterior abdominal wall just above the fascia see axial image 75. Subcutaneous infectious cannot be excluded. Clinical correlation is necessary.  Unenhanced pancreas spleen and adrenal glands are unremarkable. Atherosclerotic calcifications of abdominal aorta and iliac arteries.  Unenhanced kidneys shows no nephrolithiasis. No hydronephrosis or hydroureter. No calcified ureteral calculi are noted.  There is no mesenteric fluid collection. Oral contrast material was given to the patient.  There is a Foley catheter in a decompressed urinary bladder there is no pericecal inflammation.  The patient is status post partial colonic resection.  Colonic diverticula are noted in distal residual colon. There is thickening of colonic wall in distal colon see axial image 68.  Small amount of free fluid noted right posterior cul-de-sac.  Axial image 65 there is tubular extraluminal contrast material in mid pelvis just lateral to sigmoid colon. This is highly suspicious for a fistulous tract. There is a mixed collection containing air and contrast just above the urinary bladder probable within left vaginal cuff recess axial image 72 measures 3.4 cm. This is highly suspicious for colonic vaginal fistula. Clinical correlation is necessary. Further evaluation with barium enema could be performed.  IMPRESSION: 1. Status post colonic resection and colostomy takedown. Small subcutaneous fluid collection and small amount of air in right lower quadrant at the colostomy site. Although this may be postsurgical in nature infection cannot be excluded. Study is limited without  IV contrast. There is small amount of deep subcutaneous fluid and  small amount of air midline anterior pelvic wall axial image 74. Infection cannot be excluded. Clinical correlation is necessary. 2. Status postcholecystectomy. 3. No nephrolithiasis.  No hydronephrosis or hydroureter. 4. Distal colonic diverticula are noted. Mild thickening of distal colonic wall. Axial image 65 there is tubular extraluminal contrast material in mid pelvis just lateral to sigmoid colon. This is highly suspicious for a fistulous tract. There is a mixed collection containing air and contrast just above the urinary bladder probable within left vaginal cuff recess axial image 72 measures 3.4 cm. This is highly suspicious for colonic vaginal fistula. Clinical correlation is necessary. Further evaluation with barium enema could be performed.  Electronically Signed: By: Natasha Mead M.D. On: 02/13/2015 13:10    Assessment/Plan 1. 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 Blood loss anemia hgb stable at 11.9 on 03/04/15  3. Wound dehiscence, initial encounter Previously with 2 sites on incision, 1 has healed with posterior aspect still open, nursing providing wound care. Has completed treatment with doxycycline, no signs of acute infection -HH nursing to follow.  4. Gastroesophageal reflux disease, esophagitis presence not specified Stable on pepcid  5. Essential (primary) hypertension Blood pressure stable.  -continue metoprolol 50 mg bid and lasix 20 mg daily  6. Hypothyroidism, unspecified hypothyroidism type Continues on levothyroxine 75 mcg   pt is stable for discharge-will need PT/Nursing per home health. No DME needed. Rx written.  will need to follow up with PCP within 2 weeks.     Janene Harvey. Biagio Borg  [facility] [phone] 8 am - 5  pm) [phone] (after hours)

## 2015-03-23 ENCOUNTER — Ambulatory Visit: Payer: Medicare HMO | Admitting: Surgery

## 2015-03-23 ENCOUNTER — Encounter: Payer: Self-pay | Admitting: Surgery

## 2015-03-23 ENCOUNTER — Ambulatory Visit (INDEPENDENT_AMBULATORY_CARE_PROVIDER_SITE_OTHER): Payer: Medicare HMO | Admitting: Surgery

## 2015-03-23 ENCOUNTER — Ambulatory Visit: Payer: Self-pay | Admitting: Surgery

## 2015-03-23 VITALS — BP 133/80 | HR 62 | Temp 97.8°F | Ht 63.0 in | Wt 204.0 lb

## 2015-03-23 DIAGNOSIS — Z09 Encounter for follow-up examination after completed treatment for conditions other than malignant neoplasm: Secondary | ICD-10-CM

## 2015-03-23 NOTE — Patient Instructions (Signed)
Follow up in the [facility] office in 3 weeks 04/13/15  with Dr Juliann Pulse

## 2015-03-26 NOTE — Progress Notes (Signed)
Surgery Progress Note  S: Doing well.  Improving diet.  Has returned home from rehab.  No significant issues with ostomy leakage O:Blood pressure 133/80, pulse 62, temperature 97.8 F (36.6 C), temperature source Oral, height 5\' 3"  (1.6 m), weight 204 lb (92.534 kg). GEN: NAD/A&Ox3 ABD: soft, min tender, nondistended, incision healed, ostomy pink  A/P 74 yo s/p colostomy reversal, s/p ileostomy for recurrent colovaginal fistula, doing well - no acute issues - f/u in 3 weeks to ensure continued improvement

## 2015-03-31 ENCOUNTER — Telehealth: Payer: Self-pay | Admitting: Pediatric Pulmonology

## 2015-03-31 NOTE — Telephone Encounter (Signed)
Patient's caretaker called and would like to get an order for social worker to come in and help patient and help bring her to appts, She know has no one to help her with anything please call casy back [phone]

## 2015-04-01 NOTE — Telephone Encounter (Signed)
Returned phone call to [facility] Nurse) at this time. No answer. Left voicemail for return phone call.

## 2015-04-04 NOTE — Telephone Encounter (Signed)
Called [place] once again at this time. No answer. Left voicemail for return phone call.

## 2015-04-13 ENCOUNTER — Ambulatory Visit (INDEPENDENT_AMBULATORY_CARE_PROVIDER_SITE_OTHER): Payer: Medicare HMO | Admitting: Surgery

## 2015-04-13 ENCOUNTER — Encounter: Payer: Self-pay | Admitting: Surgery

## 2015-04-13 VITALS — BP 152/83 | HR 60 | Temp 97.8°F | Ht 63.0 in | Wt 206.6 lb

## 2015-04-13 DIAGNOSIS — Z09 Encounter for follow-up examination after completed treatment for conditions other than malignant neoplasm: Secondary | ICD-10-CM

## 2015-04-13 MED ORDER — DIPHENOXYLATE-ATROPINE 2.5-0.025 MG PO TABS: 1.0000 | ORAL_TABLET | Freq: Four times a day (QID) | ORAL | Status: DC | PRN

## 2015-04-13 NOTE — Patient Instructions (Addendum)
We have given a prescription today for Lomotil. This should last the entire time you are gone to [place].  We will have you follow-up in 3 weeks with Dr. Egbert Garibaldi.  Please call our office with any questions or concerns.

## 2015-04-13 NOTE — Progress Notes (Signed)
Surgery Progress Note  S: Doing well.  Tolerating diet. Having well-controlled BM.  No pain O:Blood pressure 152/83, pulse 60, temperature 97.8 F (36.6 C), temperature source Oral, height  (1.6 m), weight 206 lb 9.6 oz (93.713 kg). GEN: NAD/A&Ox3 ABD: soft, min tender, nondistended, incision healed, ostomy pink  A/P 74 yo s/p colostomy closure, loop ileostomy for postop leak - f/u in 3 weeks with Dr. Egbert Garibaldi - rx for lomotil

## 2015-05-05 ENCOUNTER — Encounter: Payer: Self-pay | Admitting: Surgery

## 2015-05-05 ENCOUNTER — Ambulatory Visit (INDEPENDENT_AMBULATORY_CARE_PROVIDER_SITE_OTHER): Payer: Medicare HMO | Admitting: Surgery

## 2015-05-05 VITALS — BP 138/72 | HR 60 | Temp 98.3°F | Wt 204.0 lb

## 2015-05-05 DIAGNOSIS — K5720 Diverticulitis of large intestine with perforation and abscess without bleeding: Secondary | ICD-10-CM

## 2015-05-08 NOTE — Progress Notes (Signed)
Surgery clinic.  Mrs. Mangel returns in follow-up.   She had a complicated postoperative course following takedown colostomy with recurrence of a colovaginal fistula requiring loop ileostomy diversion. This was performed in August of this year. She is now back home. She is having minimal issues with her ileostomy appliance. She is unsure whether she wants to have the ileostomy ever reversed. In addition, she is also speaking about moving him back in with one of her sons either in [place] state or in [place]. She does not think that she is psychologically or physically ready for any attempts at reversal. She understands, as she is a retired [employer] fundraiser that the minimum amount of time would be at least 1 year since the time of the last operation.  On physical examination her midline wound is healed completely and I can appreciate no obvious ventral midline hernia. The right lower quadrant ileostomy seems to be functional and well brooked.  Impression:  recurrent colovaginal fistula which I believe was related to an anastomotic dehiscence as it occurred approximately 2 weeks after the initial operation for colostomy takedown. Although she is doing well I do not think that she is ready at this point for consideration of reversal. I discussed with her in detail the need for preoperative imaging to rule out persistent colovaginal fistula. She understands that this would best be done either at the [facility] in [place] [facility] which is one potential site of her relocation versus a tertiary care Medical Center in [place].  At this point I'll see her back in my office as needed. I did inform her of my relocation of my practice in January. She is free to contact the office in the future to obtain medical records or if she wants to pursue reversal here I told her to come back to our office and we direct her as to what needs to occur.

## 2015-07-03 ENCOUNTER — Emergency Department
Admission: EM | Admit: 2015-07-03 | Discharge: 2015-07-03 | Disposition: A | Discharge status: Home / Self Care | Payer: Medicare HMO | Attending: Emergency Medicine | Admitting: Emergency Medicine

## 2015-07-03 ENCOUNTER — Emergency Department: Payer: Medicare HMO

## 2015-07-03 DIAGNOSIS — Z791 Long term (current) use of non-steroidal anti-inflammatories (NSAID): Secondary | ICD-10-CM | POA: Insufficient documentation

## 2015-07-03 DIAGNOSIS — R109 Unspecified abdominal pain: Secondary | ICD-10-CM | POA: Diagnosis present

## 2015-07-03 DIAGNOSIS — G8918 Other acute postprocedural pain: Secondary | ICD-10-CM | POA: Insufficient documentation

## 2015-07-03 DIAGNOSIS — Z79899 Other long term (current) drug therapy: Secondary | ICD-10-CM | POA: Insufficient documentation

## 2015-07-03 DIAGNOSIS — I10 Essential (primary) hypertension: Secondary | ICD-10-CM | POA: Diagnosis not present

## 2015-07-03 DIAGNOSIS — Z87891 Personal history of nicotine dependence: Secondary | ICD-10-CM | POA: Diagnosis not present

## 2015-07-03 DIAGNOSIS — Z792 Long term (current) use of antibiotics: Secondary | ICD-10-CM | POA: Diagnosis not present

## 2015-07-03 DIAGNOSIS — F419 Anxiety disorder, unspecified: Secondary | ICD-10-CM | POA: Insufficient documentation

## 2015-07-03 DIAGNOSIS — K9400 Colostomy complication, unspecified: Secondary | ICD-10-CM | POA: Insufficient documentation

## 2015-07-03 LAB — URINALYSIS COMPLETE WITH MICROSCOPIC (ARMC ONLY)
BILIRUBIN URINE: NEGATIVE
GLUCOSE, UA: NEGATIVE mg/dL
KETONES UR: NEGATIVE mg/dL
NITRITE: POSITIVE — AB
Protein, ur: NEGATIVE mg/dL
SPECIFIC GRAVITY, URINE: 1.02 (ref 1.005–1.030)
pH: 5 (ref 5.0–8.0)

## 2015-07-03 LAB — CBC WITH DIFFERENTIAL/PLATELET
BASOS ABS: 0 10*3/uL (ref 0–0.1)
BASOS PCT: 1 %
EOS PCT: 4 %
Eosinophils Absolute: 0.3 10*3/uL (ref 0–0.7)
HCT: 42 % (ref 35.0–47.0)
Hemoglobin: 13.7 g/dL (ref 12.0–16.0)
LYMPHS PCT: 17 %
Lymphs Abs: 1.3 10*3/uL (ref 1.0–3.6)
MCH: 30.4 pg (ref 26.0–34.0)
MCHC: 32.5 g/dL (ref 32.0–36.0)
MCV: 93.6 fL (ref 80.0–100.0)
MONO ABS: 0.6 10*3/uL (ref 0.2–0.9)
MONOS PCT: 8 %
Neutro Abs: 5.6 10*3/uL (ref 1.4–6.5)
Neutrophils Relative %: 72 %
PLATELETS: 166 10*3/uL (ref 150–440)
RBC: 4.49 MIL/uL (ref 3.80–5.20)
RDW: 14 % (ref 11.5–14.5)
WBC: 7.8 10*3/uL (ref 3.6–11.0)

## 2015-07-03 LAB — COMPREHENSIVE METABOLIC PANEL
ALT: 18 U/L (ref 14–54)
ANION GAP: 8 (ref 5–15)
AST: 24 U/L (ref 15–41)
Albumin: 4.1 g/dL (ref 3.5–5.0)
Alkaline Phosphatase: 90 U/L (ref 38–126)
BILIRUBIN TOTAL: 0.8 mg/dL (ref 0.3–1.2)
BUN: 27 mg/dL — AB (ref 6–20)
CHLORIDE: 108 mmol/L (ref 101–111)
CO2: 26 mmol/L (ref 22–32)
Calcium: 9.1 mg/dL (ref 8.9–10.3)
Creatinine, Ser: 0.89 mg/dL (ref 0.44–1.00)
GFR calc Af Amer: >60 mL/min (ref >60)
Glucose, Bld: 115 mg/dL — ABNORMAL HIGH (ref 65–99)
POTASSIUM: 3.7 mmol/L (ref 3.5–5.1)
Sodium: 142 mmol/L (ref 135–145)
TOTAL PROTEIN: 7.4 g/dL (ref 6.5–8.1)

## 2015-07-03 LAB — LIPASE, BLOOD: LIPASE: 35 U/L (ref 11–51)

## 2015-07-03 MED ORDER — NYSTATIN 100000 UNIT/GM EX POWD
Freq: Once | CUTANEOUS | Status: AC
  Administered 2015-07-03: 15:00:00 via TOPICAL
  Filled 2015-07-03: qty 15

## 2015-07-03 MED ORDER — BARIUM SULFATE 2.1 % PO SUSP
450.0000 mL | ORAL | Status: AC
  Administered 2015-07-03: 450 mL via ORAL

## 2015-07-03 MED ORDER — HYDROMORPHONE HCL 1 MG/ML IJ SOLN
1.0000 mg | Freq: Once | INTRAMUSCULAR | Status: AC
  Administered 2015-07-03: 1 mg via INTRAVENOUS
  Filled 2015-07-03: qty 1

## 2015-07-03 MED ORDER — LOPERAMIDE HCL 2 MG PO CAPS
2.0000 mg | ORAL_CAPSULE | Freq: Once | ORAL | Status: AC
  Administered 2015-07-03: 2 mg via ORAL
  Filled 2015-07-03: qty 1

## 2015-07-03 MED ORDER — LORAZEPAM 2 MG/ML IJ SOLN
0.5000 mg | Freq: Once | INTRAMUSCULAR | Status: AC
  Administered 2015-07-03: 0.5 mg via INTRAVENOUS
  Filled 2015-07-03: qty 1

## 2015-07-03 MED ORDER — ONDANSETRON HCL 4 MG/2ML IJ SOLN
4.0000 mg | Freq: Once | INTRAMUSCULAR | Status: AC
  Administered 2015-07-03: 4 mg via INTRAVENOUS
  Filled 2015-07-03: qty 2

## 2015-07-03 MED ORDER — SODIUM CHLORIDE 0.9 % IV BOLUS (SEPSIS)
500.0000 mL | Freq: Once | INTRAVENOUS | Status: AC
  Administered 2015-07-03: 500 mL via INTRAVENOUS

## 2015-07-03 MED ORDER — TRAMADOL HCL 50 MG PO TABS: 50.0000 mg | ORAL_TABLET | Freq: Four times a day (QID) | ORAL | Status: DC | PRN

## 2015-07-03 MED ORDER — DIPHENOXYLATE-ATROPINE 2.5-0.025 MG PO TABS
1.0000 | ORAL_TABLET | Freq: Once | ORAL | Status: AC
  Administered 2015-07-03: 1 via ORAL
  Filled 2015-07-03: qty 1

## 2015-07-03 NOTE — ED Notes (Signed)
No leaking noted around ileostomy site and bag remains intact. Stool noted in bag. Nystatin powder placed around site to irritated skin.

## 2015-07-03 NOTE — ED Notes (Signed)
Cicero Duck, RN from floor came to ER to assist with placing stoma powder, adhesive and new bag. Bag is in place over stoma at this time and no leaks noted. Will continue to monitor. RN suggested Nystatin powder to place on irritated skin around site. MD aware of RN suggestion. Family remains at bedside.

## 2015-07-03 NOTE — ED Notes (Signed)
Family states that pt has drank 1.5 bottles of contrast and is unable to drink anymore at this time. CT scan notified and states will be here to get pt in 10 minutes. Family and pt made aware. Family requesting more gauze and washcloths. Supplies provided to pt.

## 2015-07-03 NOTE — ED Notes (Signed)
MD at bedside. 

## 2015-07-03 NOTE — ED Notes (Signed)
MD gave verbal orders for the Lomotil and Imodium. Clarified dosage with pt per pt's home dose. Will administer medication and continue to monitor pt.

## 2015-07-03 NOTE — ED Notes (Signed)
Pharmacy notified of nystatin order and will tube bottle of Nystatin powder to ER.

## 2015-07-03 NOTE — ED Notes (Addendum)
Pt has approx 1/3 left to drink of second bottle of barium. Family at bedside.

## 2015-07-03 NOTE — ED Notes (Signed)
Pt almost finished with first bottle of barium. Approx 1/3 left. Pt instructed to drink 2nd bottle of barium upon completion of first by CT staff. Stoma care provided, cleansed and dried. Pt declines use of 2 piece collection kit.

## 2015-07-03 NOTE — ED Provider Notes (Signed)
Time Seen: Approximately 0845  I have reviewed the triage notes  Chief Complaint: Abdominal Pain   History of Present Illness: Martha Chung is a 75 y.o. female who presents via EMS with difficulty with her ostomy site. She states over the last couple days she's had increased pain below the ostomy slightly to the right and has had difficulty getting the ostomy collection bag to adhesed to her abdominal area. Patient has a complicated surgical history of previous colostomy takedown and extensive surgery due to diverticulitis with a large intestine perforation and abscess. The patient developed a colovaginal fistula and there was attempt to revise her colostomy which was unsuccessful. The patient denies any bloody stool. She has some nausea with no persistent vomiting. Pain again is seemingly below her ostomy site and slightly to the right. She denies any difficulty with urination or fever. She denies any back or flank pain.   Past Medical History  Diagnosis Date  . Hypertension   . Diverticulitis   . Hypothyroidism   . Gout   . Arthritis     hands, feet, "all over"  . GERD (gastroesophageal reflux disease)   . CHF (congestive heart failure) (HCC)     acute, after use of Redux  . Mitral regurgitation     after use of redux  . COPD (chronic obstructive pulmonary disease) (HCC)     chronic bronchitis - no meds  . Wears contact lenses   . Full dentures     upper and lower  . Right rotator cuff tendonitis   . Lichen sclerosus     Vaginal Area  . Complication of anesthesia     on vent after colostomy revision    Patient Active Problem List   Diagnosis Date Noted  . Adjustment disorder with mixed anxiety and depressed mood 02/17/2015  . Other postoperative complication involving digestive system   . 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 Arthritis 02/12/2015  . BP (high blood pressure) 02/12/2015  . Diverticulitis large intestine 01/31/2015  . Diverticulitis 12/07/2014   . 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  . Essential (primary) hypertension 03/31/2014  . Acid reflux 03/31/2014  . Gout 03/31/2014  . Adult hypothyroidism 03/31/2014  . Osteopenia 03/31/2014    Past Surgical History  Procedure Laterality Date  . Tonsillectomy    . Kidney surgery Right   . Cholecystectomy    . Colectomy with colostomy creation/hartmann procedure  07/03/14  . Laparotomy      construct new colostomy and place wound VAC  . Colonoscopy N/A 11/25/2014    Procedure: COLONOSCOPY;  Surgeon: Midge Minium, MD;  Location: [facility];  Service: Gastroenterology;  Laterality: N/A;  . Polypectomy  11/25/2014    Procedure: POLYPECTOMY INTESTINAL;  Surgeon: Midge Minium, MD;  Location: [facility];  Service: Gastroenterology;;  . Abdominal hysterectomy  1967    Partial  . Joint replacement Right 2006    knee  . Joint replacement Left 2008    knee  . Correction overlapping toes Bilateral 2012    bilateral hammertoes #2 & #3 both feet  . Colostomy takedown N/A 01/31/2015    Procedure: COLOSTOMY TAKEDOWN, partial colectomy;  Surgeon: [name], MD;  Location: [facility];  Service: General;  Laterality: N/A;  . Laparotomy N/A 02/14/2015    Procedure: RE-OPEN EXPLORATORY LAPAROTOMY, COMPLEX LYSIS OF ADHESIONS, CONSTRUCTION OF LOOP ILEOSTOMY,;  Surgeon: [name], MD;  Location: [facility];  Service: General;  Laterality: N/A;  . Application of wound vac  02/14/2015    Procedure: APPLICATION OF WOUND VAC;  Surgeon: [name], MD;  Location: [facility];  Service: General;;    Past Surgical History  Procedure Laterality Date  . Tonsillectomy    . Kidney surgery Right   . Cholecystectomy    . Colectomy with colostomy creation/hartmann procedure  07/03/14  . Laparotomy      construct new colostomy and place wound VAC  . Colonoscopy N/A 11/25/2014    Procedure: COLONOSCOPY;  Surgeon: Midge Minium, MD;  Location: [facility];  Service: Gastroenterology;  Laterality: N/A;  .  Polypectomy  11/25/2014    Procedure: POLYPECTOMY INTESTINAL;  Surgeon: Midge Minium, MD;  Location: [facility];  Service: Gastroenterology;;  . Abdominal hysterectomy  1967    Partial  . Joint replacement Right 2006    knee  . Joint replacement Left 2008    knee  . Correction overlapping toes Bilateral 2012    bilateral hammertoes #2 & #3 both feet  . Colostomy takedown N/A 01/31/2015    Procedure: COLOSTOMY TAKEDOWN, partial colectomy;  Surgeon: [name], MD;  Location: [facility];  Service: General;  Laterality: N/A;  . Laparotomy N/A 02/14/2015    Procedure: RE-OPEN EXPLORATORY LAPAROTOMY, COMPLEX LYSIS OF ADHESIONS, CONSTRUCTION OF LOOP ILEOSTOMY,;  Surgeon: [name], MD;  Location: [facility];  Service: General;  Laterality: N/A;  . Application of wound vac  02/14/2015    Procedure: APPLICATION OF WOUND VAC;  Surgeon: [name], MD;  Location: [facility];  Service: General;;    Current Outpatient Rx  Name  Route  Sig  Dispense  Refill  . acetaminophen (TYLENOL) 325 MG tablet   Oral   Take 2 tablets (650 mg total) by mouth every 6 (six) hours as needed for mild pain (or temp > 100).         Marland Kitchen allopurinol (ZYLOPRIM) 100 MG tablet   Oral   Take 100 mg by mouth daily. AM         . Calcium Carb-Cholecalciferol (CALCIUM 600 + D PO)   Oral   Take 600 mg elemental calcium/kg/hr by mouth 2 (two) times daily.         . clobetasol cream (TEMOVATE) 0.05 %   Topical   Apply 1 application topically 2 (two) times daily.         . diphenoxylate-atropine (LOMOTIL) 2.5-0.025 MG tablet   Oral   Take 1 tablet by mouth 4 (four) times daily as needed for diarrhea or loose stools.   120 tablet   2   . famotidine (PEPCID) 20 MG tablet   Oral   Take 20 mg by mouth 2 (two) times daily.         . furosemide (LASIX) 20 MG tablet   Oral   Take 20 mg by mouth every morning. AM         . levothyroxine (SYNTHROID, LEVOTHROID) 75 MCG tablet   Oral   Take 75 mcg by mouth daily  before breakfast.         . meloxicam (MOBIC) 7.5 MG tablet   Oral   Take 1 tablet by mouth.         . metoprolol (LOPRESSOR) 50 MG tablet   Oral   Take 50 mg by mouth 2 (two) times daily.         . Multiple Vitamin (MULTIVITAMIN) tablet   Oral   Take 1 tablet by mouth daily.         Marland Kitchen  mupirocin ointment (BACTROBAN) 2 %   Nasal   Place 1 application into the nose 2 (two) times daily.         . ondansetron (ZOFRAN) 4 MG tablet   Oral   Take 1 tablet (4 mg total) by mouth every 8 (eight) hours as needed for nausea.   30 tablet   5   . saccharomyces boularMarland Kitchendii (FLORASTOR) 250 MG capsule   Oral   Take 250 mg by mouth 2 (two) times daily.         . scopolamine (TRANSDERM-SCOP) 1 MG/3DAYS   Transdermal   Place 1 patch (1.5 mg total) onto the skin every 3 (three) days.   10 patch   2   . traMADol (ULTRAM) 50 MG tablet   Oral   Take 2 tablets (100 mg total) by mouth every 6 (six) hours as needed for moderate pain.   240 tablet   5     Allergies:  Ivp dye; Morphine and related; Shellfish allergy; Macrodantin; and Nubain  Family History: Family History  Problem Relation Age of Onset  . Hypertension Mother   . COPD Mother   . Heart disease Mother   . Cancer Mother     Breast  . Heart disease Father   . COPD Father   . Cancer Brother 53    Lung  . Cancer Daughter 7    Brain    Social History: Social History  Substance Use Topics  . Smoking status: Former Smoker    Quit date: 07/03/1999  . Smokeless tobacco: Never Used  . Alcohol Use: No     Review of Systems:   10 point review of systems was performed and was otherwise negative:  Constitutional: No fever Eyes: No visual disturbances ENT: No sore throat, ear pain Cardiac: No chest pain Respiratory: No shortness of breath, wheezing, or stridor Abdomen: Pain below the ostomy site, no vomiting, No diarrhea Endocrine: No weight loss, No night sweats Extremities: No peripheral edema,  cyanosis Skin: No rashes, easy bruising Neurologic: No focal weakness, trouble with speech or swollowing Urologic: No dysuria, Hematuria, or urinary frequency   Physical Exam:  ED Triage Vitals  Enc Vitals Group     BP 07/03/15 0832 133/69 mmHg     Pulse Rate 07/03/15 0832 69     Resp 07/03/15 0835 18     Temp 07/03/15 0832 98 F (36.7 C)     Temp Source 07/03/15 0832 Oral     SpO2 07/03/15 0832 98 %     Weight 07/03/15 0835 204 lb (92.534 kg)     Height 07/03/15 0835  (1.676 m)     Head Cir --      Peak Flow --      Pain Score 07/03/15 0835 8     Pain Loc --      Pain Edu? --      Excl. in GC? --     General: Awake , Alert , and Oriented times 3; GCS 15. Anxious Head: Normal cephalic , atraumatic Eyes: Pupils equal , round, reactive to light Nose/Throat: No nasal drainage, patent upper airway without erythema or exudate.  Neck: Supple, Full range of motion, No anterior adenopathy or palpable thyroid masses Lungs: Clear to ascultation without wheezes , rhonchi, or rales Heart: Regular rate, regular rhythm without murmurs , gallops , or rubs Abdomen: Examination of the abdomen shows some well-healed well aligned surgical midline wound with right-sided ostomy. It appears to be draining  with normal-appearing stool. Patient has hyperactive bowel sounds and tenderness with palpation superior and lateral to the ostomy site. There is no reproducible lower abdominal pain.        Extremities: 2 plus symmetric pulses. No edema, clubbing or cyanosis Neurologic: normal ambulation, Motor symmetric without deficits, sensory intact Skin: warm, dry, no rashes   Labs:   All laboratory work was reviewed including any pertinent negatives or positives listed below:  Labs Reviewed  COMPREHENSIVE METABOLIC PANEL - Abnormal; Notable for the following:    Glucose, Bld 115 (*)    BUN 27 (*)    All other components within normal limits  CBC WITH DIFFERENTIAL/PLATELET  LIPASE, BLOOD   URINALYSIS COMPLETEWITH MICROSCOPIC ([facility] ONLY)   review of the laboratory work showed no significant abnormalities  Radiology:     I personally reviewed the radiologic studies EXAM: CT ABDOMEN AND PELVIS WITHOUT CONTRAST  TECHNIQUE: Multidetector CT imaging of the abdomen and pelvis was performed following the standard protocol without IV contrast.  COMPARISON: CT scan of February 13, 2015.  FINDINGS: Multilevel degenerative disc disease is noted in the lumbar spine. Visualized lung lung bases are unremarkable.  Status post cholecystectomy. No focal abnormality is noted in the liver, spleen or pancreas on these unenhanced images. Adrenal glands and kidneys appear normal. No hydronephrosis or renal obstruction is noted. No renal or ureteral calculi are noted. Atherosclerosis of abdominal aorta is noted without aneurysm formation. Urinary bladder appears normal. Status post hysterectomy. 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 This hernia also contains a portion of sigmoid colon. There is no evidence of bowel obstruction. No abnormal fluid collection is noted. Sigmoid diverticulosis is noted without inflammation. Urinary bladder appears normal. No significant adenopathy is noted.  IMPRESSION: Atherosclerosis of abdominal aorta without aneurysm formation.  No hydronephrosis or renal obstruction is noted. No renal or ureteral calculi are noted.  Ileostomy is now noted in right lower quadrant with large peristomal hernia, which contains a portion of sigmoid colon. There is no evidence of bowel obstruction or abscess seen.    ED Course:  Patient's stay here showed gradual improvement as far as pain, etc. She was given IV Dilaudid and Zofran for discomfort. Patient underwent abdominal CT evaluation and did not appear to have any signs of a bowel obstruction, perforation, abscess, or any complication from her colostomy site. She's having  difficulty getting the bag on her colostomy site. Consultation was made with her surgeon and also with the MedSurg floor which sent one of their nurses down to help [place] with management of her colostomy. The patient ended up using her own material and we also provided her with some nystatin for the skin irritation as occurring around the site. Patient's otherwise stable and I felt there was no reason to admit her to the hospital at this point.  Assessment: * Colostomy evaluation Abdominal pain    Plan:  Outpatient management Patient was advised to return immediately if condition worsens. Patient was advised to follow up with their primary care physician or other specialized physicians involved in their outpatient care             Jennye Moccasin, MD 07/03/15 1452

## 2015-07-03 NOTE — ED Notes (Signed)
Pt arrives via [facility] from home c/o right sided abdominal pain that started this AM. Pt c/o burning at site of ileostomy to RLQ that began Friday. Pt skin around site reddened and burning patient. Pt alert and oriented X4, active, cooperative, pt in NAD. RR even and unlabored, color WNL.

## 2015-07-03 NOTE — ED Notes (Signed)
MD at bedside to discuss CT results.

## 2015-07-03 NOTE — ED Notes (Addendum)
Pt offered 2 piece ostomy collection kit that was sent by supply chain. Pt states that she uses a 1 piece. Pt states that the 2 piece will not work due to the rigidity of the ring on stomach. Supply chain states that the hospital does not carry 1 piece collection kits. Inquired about collection kit to [facility], where pt states that she received 1 piece collection kits, [facility] only has 2 piece. Pt informed.

## 2015-07-03 NOTE — ED Notes (Signed)
Pt assisted onto bed pan. Pt cleaned and urine collected.

## 2015-07-03 NOTE — ED Notes (Addendum)
Pt back from CT scan. Pt requesting more washcloths. Pt continuing to place 2x2 gauze and washcloth over stoma site to keep area clean. Pt does not have bag on at this time. Pt states she takes Lomotil and Imodium and is requesting her dose. Will ask MD about medication.

## 2015-07-03 NOTE — ED Notes (Signed)
Patient transported to CT 

## 2015-07-03 NOTE — ED Notes (Signed)
Pt ambulated to toilet to empty ileostomy bag. Pt sat down on toilet to urinate and when standing up noticed ileostomy bag to be leaking. Pt does not have another kit with her and is unable to use what the hospital stocks. Pt states she will change bag when she gets home. Disposable pads placed under site where stool is leaking. Pt is to follow up with surgical center in AM regarding site care. Pt verbalizes understanding of follow up care and prescriptions at this time.

## 2015-07-04 DIAGNOSIS — N829 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, unspecified: Secondary | ICD-10-CM | POA: Insufficient documentation

## 2015-07-25 DIAGNOSIS — R7302 Impaired glucose tolerance (oral): Secondary | ICD-10-CM | POA: Insufficient documentation

## 2015-09-14 ENCOUNTER — Other Ambulatory Visit
Admission: RE | Admit: 2015-09-14 | Discharge: 2015-09-14 | Disposition: A | Discharge status: Home / Self Care | Payer: Medicare HMO | Source: Ambulatory Visit | Attending: Student | Admitting: Student

## 2015-09-14 DIAGNOSIS — K5732 Diverticulitis of large intestine without perforation or abscess without bleeding: Secondary | ICD-10-CM | POA: Diagnosis present

## 2015-09-14 LAB — CBC WITH DIFFERENTIAL/PLATELET
BASOS ABS: 0.1 10*3/uL (ref 0–0.1)
Basophils Relative: 1 %
Eosinophils Absolute: 0.1 10*3/uL (ref 0–0.7)
Eosinophils Relative: 2 %
HEMATOCRIT: 40.3 % (ref 35.0–47.0)
HEMOGLOBIN: 13.1 g/dL (ref 12.0–16.0)
LYMPHS ABS: 1 10*3/uL (ref 1.0–3.6)
LYMPHS PCT: 13 %
MCH: 31.2 pg (ref 26.0–34.0)
MCHC: 32.5 g/dL (ref 32.0–36.0)
MCV: 96 fL (ref 80.0–100.0)
Monocytes Absolute: 0.6 10*3/uL (ref 0.2–0.9)
Monocytes Relative: 8 %
NEUTROS ABS: 5.8 10*3/uL (ref 1.4–6.5)
NEUTROS PCT: 76 %
Platelets: 221 10*3/uL (ref 150–440)
RBC: 4.2 MIL/uL (ref 3.80–5.20)
RDW: 15.6 % — ABNORMAL HIGH (ref 11.5–14.5)
WBC: 7.6 10*3/uL (ref 3.6–11.0)

## 2015-09-14 LAB — CREATININE, SERUM
CREATININE: 0.87 mg/dL (ref 0.44–1.00)
GFR calc Af Amer: >60 mL/min (ref >60)
GFR calc non Af Amer: >60 mL/min (ref >60)

## 2015-09-14 LAB — BUN: BUN: 22 mg/dL — AB (ref 6–20)

## 2016-01-28 IMAGING — CR DG CHEST 1V PORT
1 series · 1 of 1 positions shown · non-contrast
Comparison: 06/20/2014

CLINICAL DATA: Shortness of breath, bronchitis, high blood pressure

EXAM:
PORTABLE CHEST - 1 VIEW

[ap]
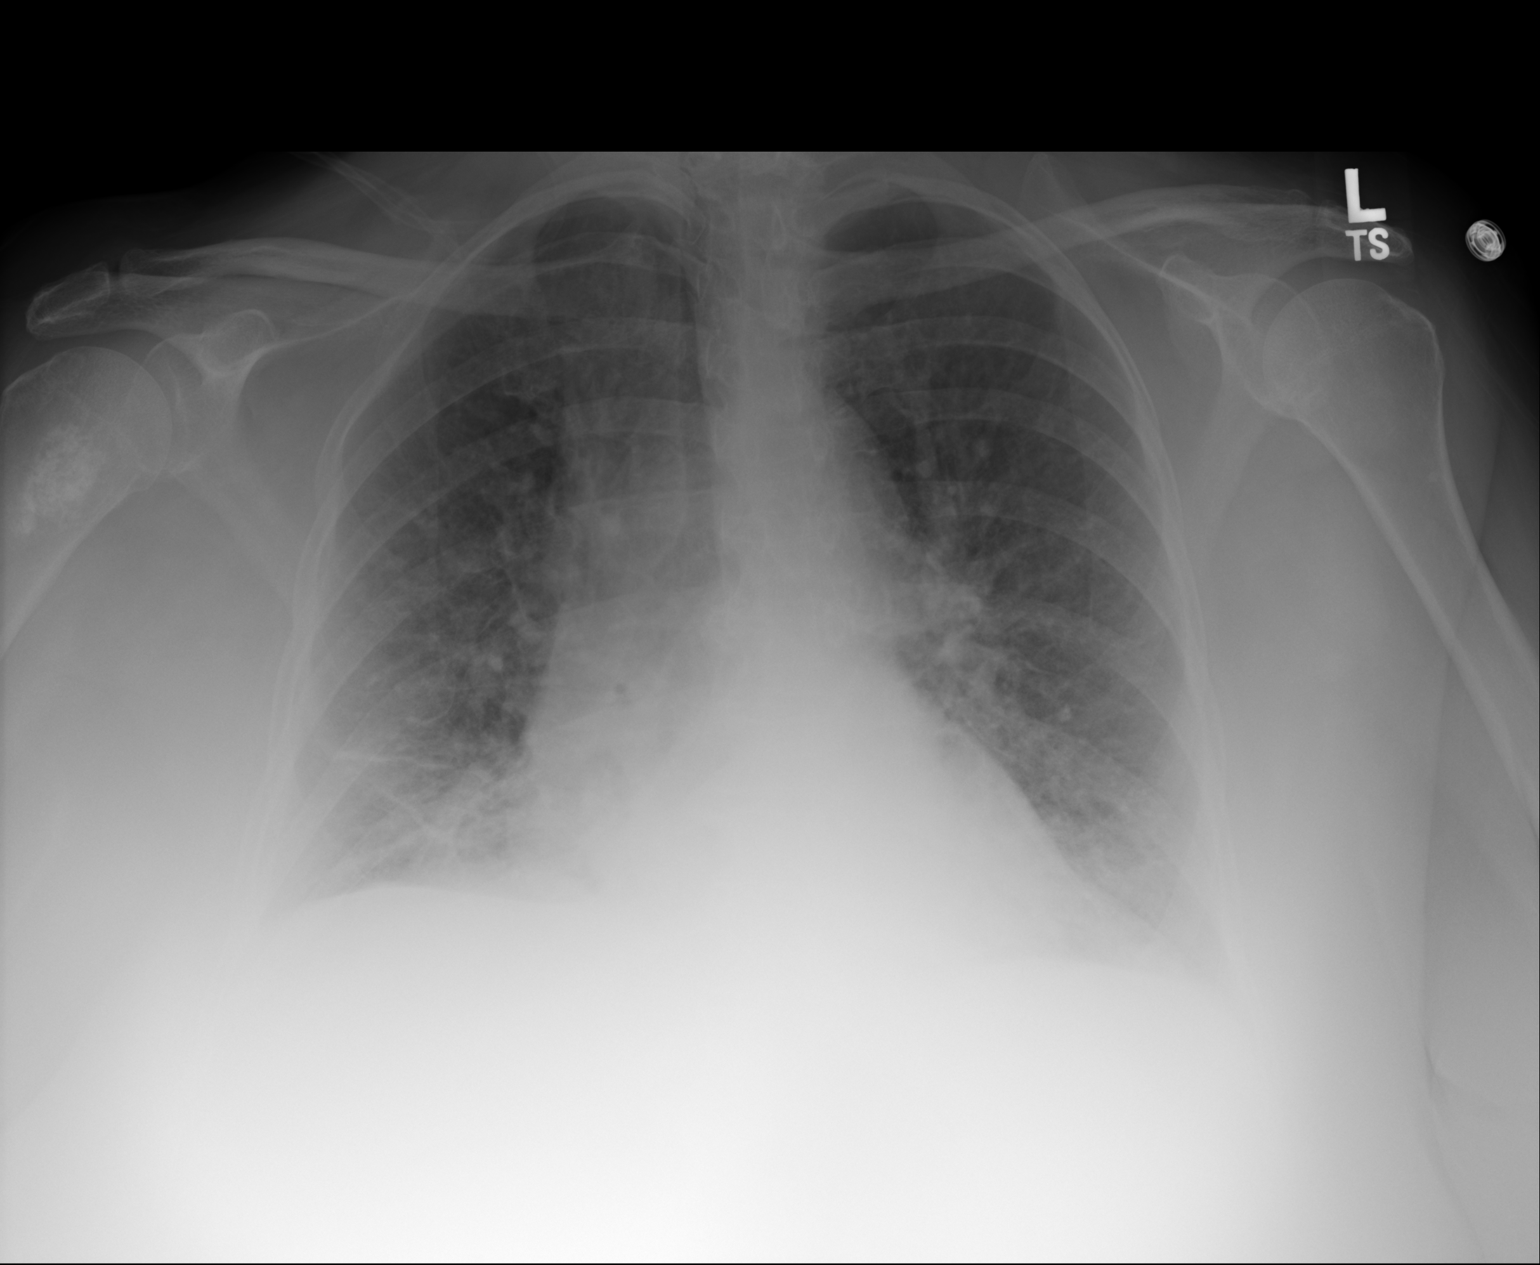

[1 of 1 positions shown; findings below may reference images not displayed]

FINDINGS: Lungs are hypoaerated with crowding of the bronchovascular markings.
Mild cardiomegaly reidentified with central vascular congestion but
no overt edema. No pleural effusion. Right proximal humeral
enchondroma or bone island again reidentified.
IMPRESSION: Low volume exam with patchy bibasilar airspace opacities which could
represent atelectasis but pneumonia could appear similar. If
symptoms persist, consider PA and lateral chest radiographs obtained
at full inspiration when the patient is clinically able.

## 2016-02-02 IMAGING — CR DG CHEST 1V PORT
1 series · 1 of 1 positions shown · non-contrast
Comparison: June 26, 2014

CLINICAL DATA: Central catheter placement

EXAM:
PORTABLE CHEST - 1 VIEW

[ap]
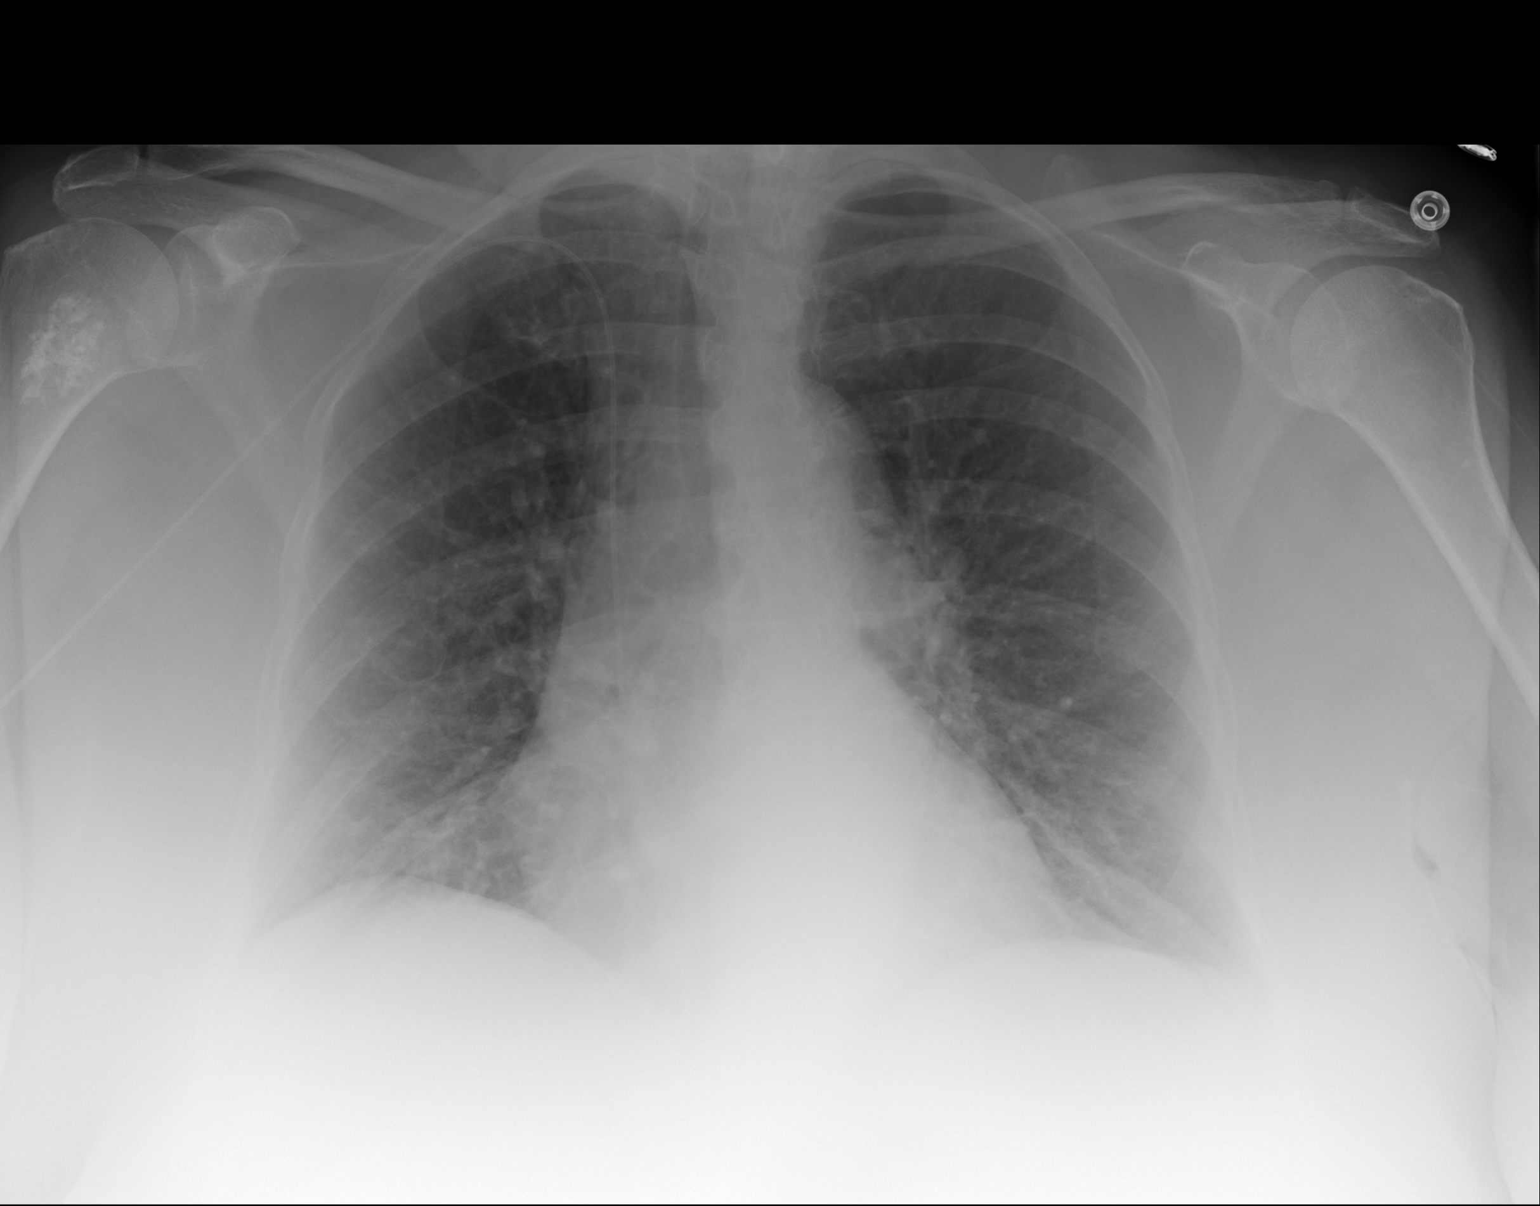

[1 of 1 positions shown; findings below may reference images not displayed]

FINDINGS: There is patchy infiltrate in the right base. Lungs are otherwise
clear. Heart size and pulmonary vascularity are normal. No
adenopathy. Central catheter tip is in the superior vena cava near
the cavoatrial junction. No pneumothorax. There is a sclerotic
lesion in the proximal right humerus, stable.
IMPRESSION: Infiltrate right base. Lungs otherwise clear. Central catheter tip
in superior vena cava near cavoatrial junction. No pneumothorax.
Sclerotic lesion right proximal humerus consistent with either an
enchondroma or bone infarct. This finding warrants a followup
evaluation in approximately 3 months to assess for stability.

## 2016-02-14 IMAGING — CR DG CHEST 1V PORT
1 series · 1 of 1 positions shown · non-contrast
Comparison: Chest radiograph 06/28/2014

CLINICAL DATA: Central line placement

EXAM:
PORTABLE CHEST - 1 VIEW

[ap]
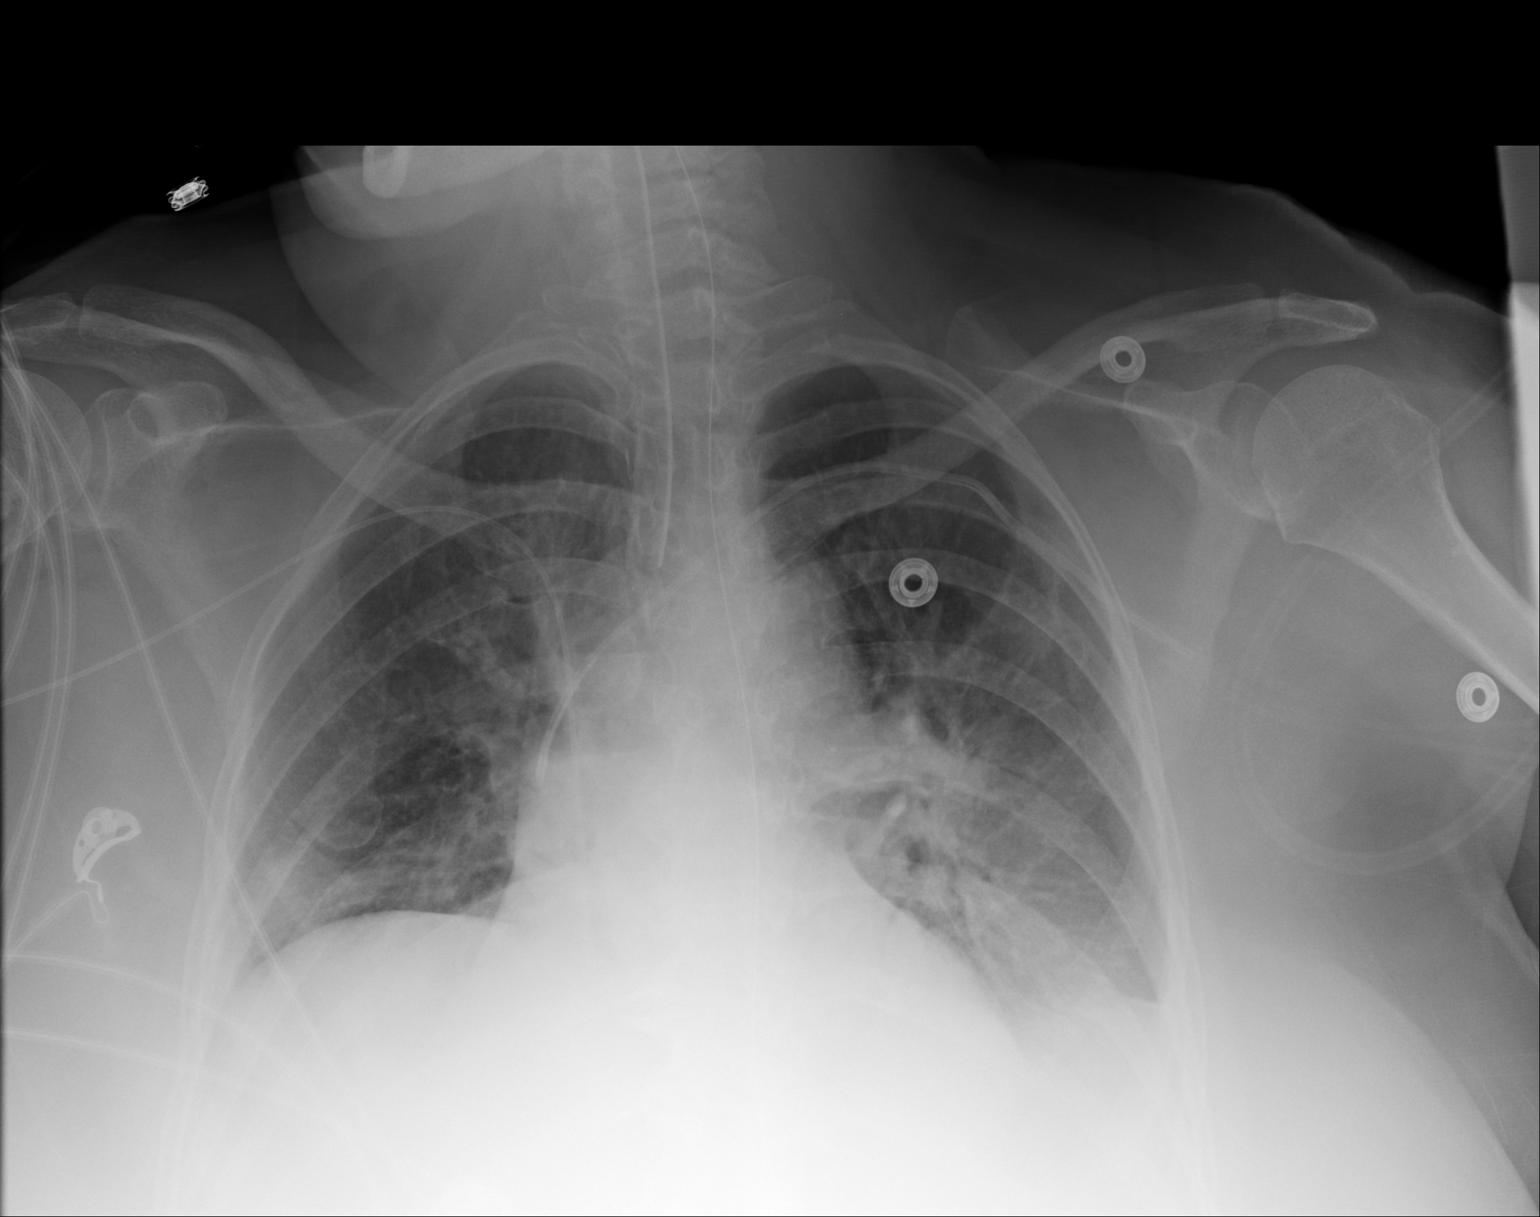

[1 of 1 positions shown; findings below may reference images not displayed]

FINDINGS: ET tube terminates within the mid trachea 2.9 cm superior to the
carina. Left-sided central venous catheter tip projects over the
superior cavoatrial junction. Right upper extremity PICC line tip is
visualized to the level of the right atrium, the tip is not clearly
identified. Stable cardiac and mediastinal contours. Low lung
volumes. Minimal left-greater-than-right heterogeneous opacities
within the lung bases. Probable small bilateral pleural effusions.
No definite pneumothorax.
IMPRESSION: Right upper extremity PICC line is visualized to the level of the
right atrium, the tip is not clearly delineated on this examination.
Recommend attention on followup radiograph.

Left-sided central venous catheter tip projects over the superior
cavoatrial junction.

ET tube terminates in the mid trachea.

Low lung volumes with bibasilar atelectasis.

Probable small bilateral pleural effusions.

## 2016-09-21 IMAGING — CR DG CHEST 1V PORT
1 series · 1 of 1 positions shown · non-contrast
Comparison: 07/11/2014 and older studies

CLINICAL DATA: PICC line placement

EXAM:
PORTABLE CHEST - 1 VIEW

[ap]
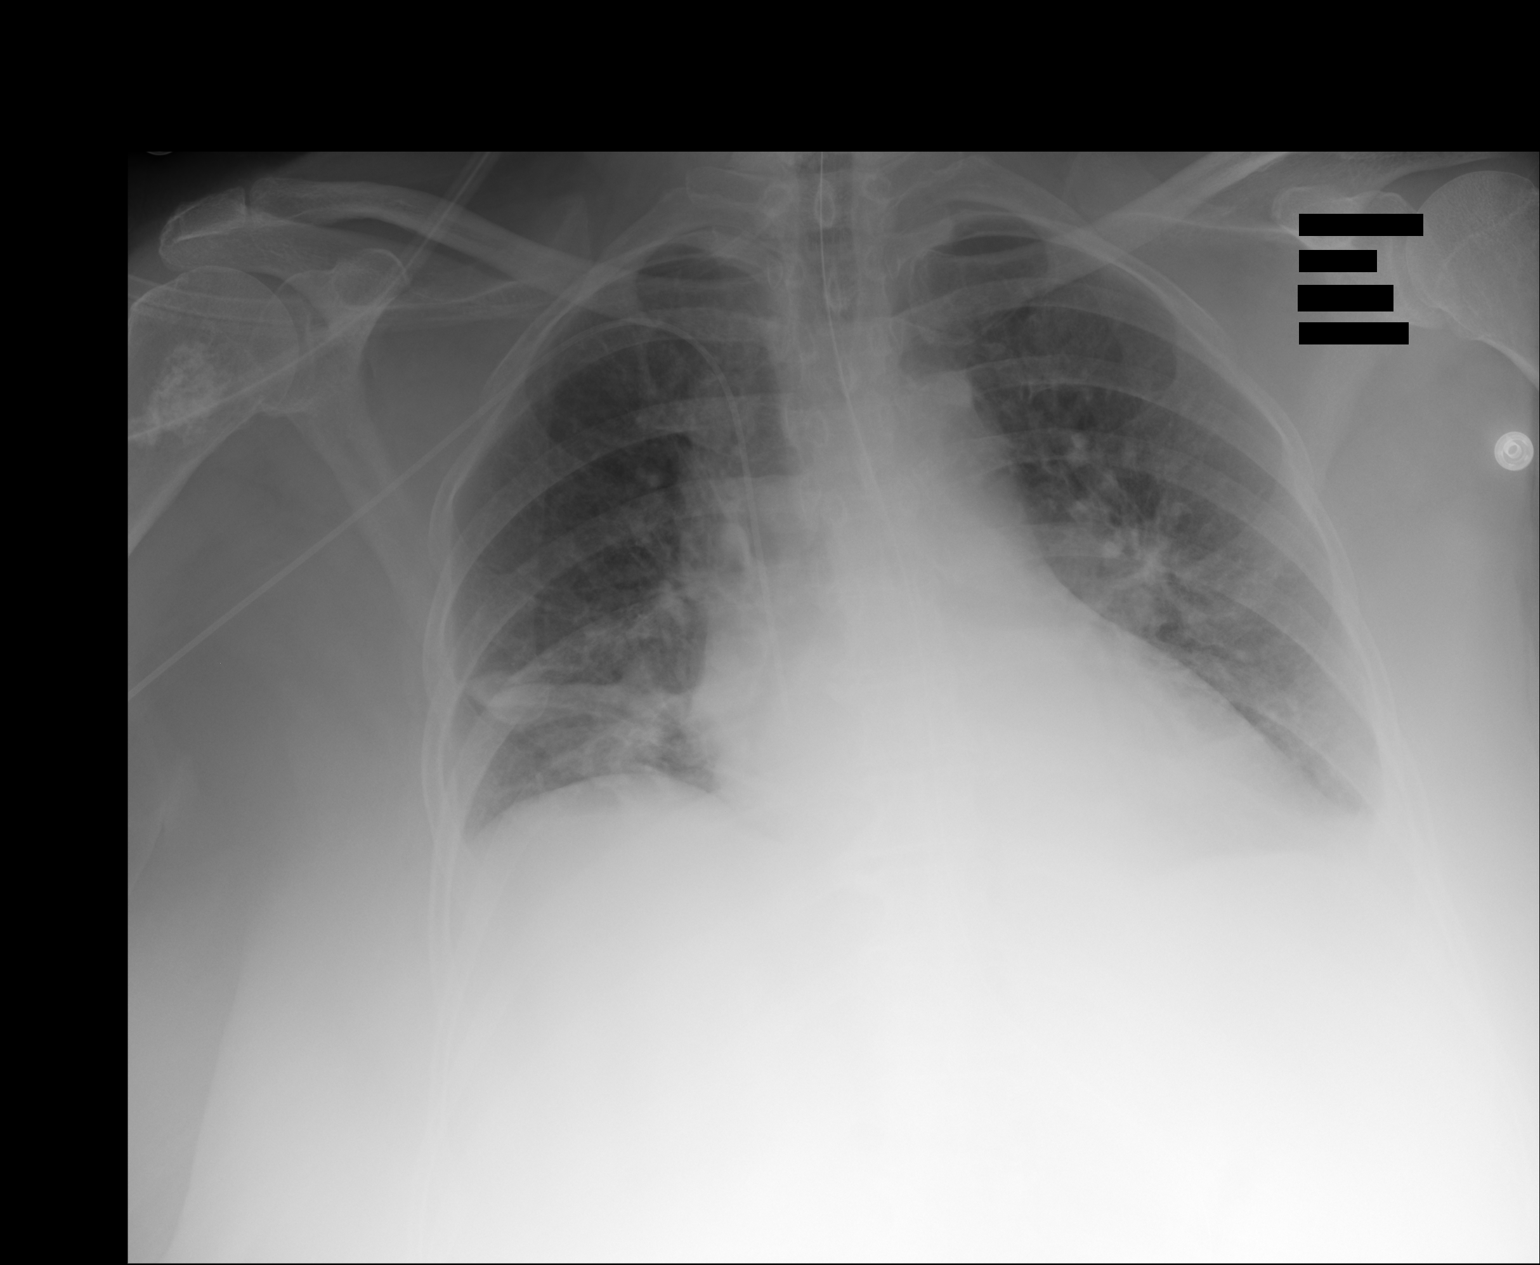

[1 of 1 positions shown; findings below may reference images not displayed]

FINDINGS: Right PICC line has been placed with tip about 3.3 cm into the right
atrium. NG tube is noted over the stomach but tip is not visible. No
pneumothorax. Limited inspiratory effect. Left perihilar and right
lower lobe opacities. 2cm sclerotic lesion proximal right humerus
unchanged from
IMPRESSION: Bilateral parenchymal opacities could be due to atelectasis or
pneumonia.

Right PICC line as described above tip 3.3 cm into the right atrium.

No pneumothorax.

Stable partially visualized sclerotic lesion right proximal humerus
appears unchanged from 06/20/2014 likely representing enchondroma
but if there is pain related to it further imaging to exclude
chondrosarcoma would be suggested.

## 2019-01-27 DIAGNOSIS — T819XXA Unspecified complication of procedure, initial encounter: Secondary | ICD-10-CM | POA: Insufficient documentation

## 2019-01-30 ENCOUNTER — Emergency Department: Payer: Medicare PPO

## 2019-01-30 ENCOUNTER — Emergency Department
Admission: EM | Admit: 2019-01-30 | Discharge: 2019-01-30 | Disposition: A | Discharge status: Home / Self Care | Payer: Medicare PPO | Attending: Student in an Organized Health Care Education/Training Program | Admitting: Student in an Organized Health Care Education/Training Program

## 2019-01-30 ENCOUNTER — Encounter: Payer: Self-pay | Admitting: Emergency Medicine

## 2019-01-30 DIAGNOSIS — Y9301 Activity, walking, marching and hiking: Secondary | ICD-10-CM | POA: Insufficient documentation

## 2019-01-30 DIAGNOSIS — I509 Heart failure, unspecified: Secondary | ICD-10-CM | POA: Diagnosis not present

## 2019-01-30 DIAGNOSIS — S0990XA Unspecified injury of head, initial encounter: Secondary | ICD-10-CM | POA: Diagnosis present

## 2019-01-30 DIAGNOSIS — Z96651 Presence of right artificial knee joint: Secondary | ICD-10-CM | POA: Insufficient documentation

## 2019-01-30 DIAGNOSIS — Z96652 Presence of left artificial knee joint: Secondary | ICD-10-CM | POA: Diagnosis not present

## 2019-01-30 DIAGNOSIS — Z79899 Other long term (current) drug therapy: Secondary | ICD-10-CM | POA: Diagnosis not present

## 2019-01-30 DIAGNOSIS — Y9389 Activity, other specified: Secondary | ICD-10-CM | POA: Insufficient documentation

## 2019-01-30 DIAGNOSIS — Z23 Encounter for immunization: Secondary | ICD-10-CM | POA: Insufficient documentation

## 2019-01-30 DIAGNOSIS — Y92513 Shop (commercial) as the place of occurrence of the external cause: Secondary | ICD-10-CM | POA: Insufficient documentation

## 2019-01-30 DIAGNOSIS — Z87891 Personal history of nicotine dependence: Secondary | ICD-10-CM | POA: Insufficient documentation

## 2019-01-30 DIAGNOSIS — Y998 Other external cause status: Secondary | ICD-10-CM | POA: Diagnosis not present

## 2019-01-30 DIAGNOSIS — I110 Hypertensive heart disease with heart failure: Secondary | ICD-10-CM | POA: Insufficient documentation

## 2019-01-30 DIAGNOSIS — J449 Chronic obstructive pulmonary disease, unspecified: Secondary | ICD-10-CM | POA: Diagnosis not present

## 2019-01-30 DIAGNOSIS — W010XXA Fall on same level from slipping, tripping and stumbling without subsequent striking against object, initial encounter: Secondary | ICD-10-CM | POA: Diagnosis not present

## 2019-01-30 DIAGNOSIS — S0121XA Laceration without foreign body of nose, initial encounter: Secondary | ICD-10-CM | POA: Insufficient documentation

## 2019-01-30 DIAGNOSIS — E039 Hypothyroidism, unspecified: Secondary | ICD-10-CM | POA: Diagnosis not present

## 2019-01-30 MED ORDER — BUTALBITAL-APAP-CAFFEINE 50-325-40 MG PO TABS: 1.0000 | ORAL_TABLET | Freq: Four times a day (QID) | ORAL | 0 refills | Status: DC | PRN

## 2019-01-30 MED ORDER — LIDOCAINE-EPINEPHRINE-TETRACAINE (LET) SOLUTION
3.0000 mL | Freq: Once | NASAL | Status: AC
  Administered 2019-01-30: 3 mL via TOPICAL
  Filled 2019-01-30: qty 3

## 2019-01-30 MED ORDER — TETANUS-DIPHTH-ACELL PERTUSSIS 5-2.5-18.5 LF-MCG/0.5 IM SUSP
0.5000 mL | Freq: Once | INTRAMUSCULAR | Status: AC
  Administered 2019-01-30: 18:00:00 0.5 mL via INTRAMUSCULAR
  Filled 2019-01-30: qty 0.5

## 2019-01-30 MED ORDER — BUPIVACAINE HCL (PF) 0.5 % IJ SOLN
30.0000 mL | Freq: Once | INTRAMUSCULAR | Status: DC
  Filled 2019-01-30: qty 30

## 2019-01-30 MED ORDER — ACETAMINOPHEN 500 MG PO TABS
1000.0000 mg | ORAL_TABLET | Freq: Once | ORAL | Status: AC
  Administered 2019-01-30: 1000 mg via ORAL
  Filled 2019-01-30: qty 2

## 2019-01-30 MED ORDER — BACITRACIN ZINC 500 UNIT/GM EX OINT
TOPICAL_OINTMENT | Freq: Once | CUTANEOUS | Status: AC
  Administered 2019-01-30: 1 via TOPICAL
  Filled 2019-01-30: qty 0.9

## 2019-01-30 NOTE — ED Notes (Signed)
Patient transported to CT 

## 2019-01-30 NOTE — ED Triage Notes (Signed)
Pt to ED by EMS after mechanical fall. Pt has lac to to nose w/ c/o of headache and c/o of pain and bruising to right knee Pt denies LOC and Neck pain.

## 2019-01-30 NOTE — ED Provider Notes (Signed)
[facility] Emergency Department Provider Note    First MD Initiated Contact with Patient 01/30/19 1542     (approximate)  I have reviewed the triage vital signs and the nursing notes.   HISTORY  Chief Complaint Fall    HPI Martha Chung is a 78 y.o. female presents the ER for mechanical fall.  States that she was out shopping with her daughter.  States that she was walking tripped over her sandals.  Falling hitting her right knee as well as her face.  Denies any blood thinners.  States that he does have a mild to moderate headache.  Denies any numbness or tingling.  Has laceration to her nose.  No blurry vision.    Past Medical History:  Diagnosis Date   Arthritis    hands, feet, "all over"   CHF (congestive heart failure) (HCC)    acute, after use of Redux   Complication of anesthesia    on vent after colostomy revision   COPD (chronic obstructive pulmonary disease) (HCC)    chronic bronchitis - no meds   Diverticulitis    Full dentures    upper and lower   GERD (gastroesophageal reflux disease)    Gout    Hypertension    Hypothyroidism    Lichen sclerosus    Vaginal Area   Mitral regurgitation    after use of redux   Right rotator cuff tendonitis    Wears contact lenses    Family History  Problem Relation Age of Onset   Hypertension Mother    COPD Mother    Heart disease Mother    Cancer Mother        Breast   Heart disease Father    COPD Father    Cancer Brother [id number]       Lung   Cancer Daughter 7       Brain   Past Surgical History:  Procedure Laterality Date   ABDOMINAL HYSTERECTOMY  1967   Partial   APPLICATION OF WOUND VAC  02/14/2015   Procedure: APPLICATION OF WOUND VAC;  Surgeon: Natale LayMark Bird, MD;  Location: [facility] OR[facility];  Service: General;;   CHOLECYSTECTOMY     COLECTOMY WITH COLOSTOMY CREATION/HARTMANN PROCEDURE  07/03/14   COLONOSCOPY N/A 11/25/2014   Procedure: COLONOSCOPY;  Surgeon: Midge Miniumarren  Wohl, MD;  [facility];  Service: Gastroenterology;  Laterality: N/A;   COLOSTOMY TAKEDOWN N/A 01/31/2015   Procedure: COLOSTOMY TAKEDOWN, partial colectomy;  Surgeon: Natale LayMark Bird, MD;  Location: [facility];  Service: General;  Laterality: N/A;   CORRECTION OVERLAPPING TOES Bilateral 2012   bilateral hammertoes #2 & #3 both feet   JOINT REPLACEMENT Right 2006   knee   JOINT REPLACEMENT Left 2008   knee   KIDNEY SURGERY Right    LAPAROTOMY     construct new colostomy and place wound VAC   LAPAROTOMY N/A 02/14/2015   Procedure: RE-OPEN EXPLORATORY LAPAROTOMY, COMPLEX LYSIS OF ADHESIONS, CONSTRUCTION OF LOOP ILEOSTOMY,;  Surgeon: Natale LayMark Bird, MD;  Location: [facility];  Service: General;  Laterality: N/A;   POLYPECTOMY  11/25/2014   Procedure: POLYPECTOMY INTESTINAL;  Surgeon: Midge Miniumarren Wohl, MD;  [facility];  Service: Gastroenterology;;   TONSILLECTOMY     Patient Active Problem List   Diagnosis Date Noted   Adjustment disorder with mixed anxiety and depressed mood 02/17/2015   Other postoperative complication involving digestive system    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 Authorizing Provider  acetaminophen (TYLENOL) 325 MG tablet Take 2 tablets (650 mg total) by mouth every 6 (six) hours as needed for mild pain (or temp > 100). 03/03/15   Marlyce Huge, MD  allopurinol (ZYLOPRIM) 100 MG tablet Take 100 mg by mouth daily. AM    [provider]  Calcium Carb-Cholecalciferol (CALCIUM 600 + D PO) Take 600 mg elemental  calcium/kg/hr by mouth 2 (two) times daily.    [provider]  clobetasol cream (TEMOVATE) 9.98 % Apply 1 application topically 2 (two) times daily.    [provider]  diphenoxylate-atropine (LOMOTIL) 2.5-0.025 MG tablet Take 1 tablet by mouth 4 (four) times daily as needed for diarrhea or loose stools. 04/13/15   Marlyce Huge, MD  famotidine (PEPCID) 20 MG tablet Take 20 mg by mouth 2 (two) times daily.    [provider]  furosemide (LASIX) 20 MG tablet Take 20 mg by mouth every morning. AM    [provider]  levothyroxine (SYNTHROID, LEVOTHROID) 75 MCG tablet Take 75 mcg by mouth daily before breakfast.    [provider]  meloxicam (MOBIC) 7.5 MG tablet Take 1 tablet by mouth. 03/28/15   [provider]  metoprolol (LOPRESSOR) 50 MG tablet Take 50 mg by mouth 2 (two) times daily.    [provider]  Multiple Vitamin (MULTIVITAMIN) tablet Take 1 tablet by mouth daily.    [provider]  mupirocin ointment (BACTROBAN) 2 % Place 1 application into the nose 2 (two) times daily.    [provider]  ondansetron (ZOFRAN) 4 MG tablet Take 1 tablet (4 mg total) by mouth every 8 (eight) hours as needed for nausea. 03/03/15   Marlyce Huge, MD  saccharomyces boulardii (FLORASTOR) 250 MG capsule Take 250 mg by mouth 2 (two) times daily.    [provider]  scopolamine (TRANSDERM-SCOP) 1 MG/3DAYS Place 1 patch (1.5 mg total) onto the skin every 3 (three) days. 03/03/15   Marlyce Huge, MD  traMADol (ULTRAM) 50 MG tablet Take 1 tablet (50 mg total) by mouth every 6 (six) hours as needed. 07/03/15   Daymon Larsen, MD    Allergies Ivp dye [iodinated diagnostic agents], Morphine and related, Shellfish allergy, Dilaudid [hydromorphone hcl], Macrodantin [nitrofurantoin], and Nubain [nalbuphine hcl]    Social History Social History   Tobacco Use   Smoking status: Former Smoker    Quit  date: 07/03/1999    Years since quitting: 19.5   Smokeless tobacco: Never Used  Substance Use Topics   Alcohol use: No   Drug use: No    Review of Systems Patient denies headaches, rhinorrhea, blurry vision, numbness, shortness of breath, chest pain, edema, cough, abdominal pain, nausea, vomiting, diarrhea, dysuria, fevers, rashes or hallucinations unless otherwise stated above in HPI. ____________________________________________   PHYSICAL EXAM:  VITAL SIGNS: Vitals:   01/30/19 1555  BP: (!) 150/62  Pulse: 71  Resp: 16  Temp: 98.4 F (36.9 C)  SpO2: 99%    Constitutional: Alert and oriented.  Eyes: Conjunctivae are normal.  Head: 2cm laceration to bridge of nose Nose: No congestion/rhinnorhea.  No septalk hematoma  Mouth/Throat: Mucous membranes are moist.   Neck: No stridor. Painless ROM.  Cardiovascular: Normal rate, regular rhythm. Grossly normal heart sounds.  Good peripheral circulation. Respiratory: Normal respiratory effort.  No retractions. Lungs CTAB. Gastrointestinal: Soft and nontender. No distention. No abdominal bruits. No CVA tenderness. Genitourinary:  Musculoskeletal: No lower extremity tenderness. ttp of right knee  No joint effusions. Neurologic:  Normal speech and language. No gross focal neurologic deficits are appreciated. No facial droop Skin:  Skin is warm, dry and intact. Lac as above Psychiatric: Mood and affect are normal. Speech and behavior are normal.  ____________________________________________   LABS (all labs ordered are listed, but only abnormal results are displayed)  No results found for this or any previous visit (from the past 24 hour(s)). ____________________________________________ ____________________________________________  RADIOLOGY  I personally reviewed all radiographic images ordered to evaluate for the above acute complaints and reviewed radiology reports and findings.  These findings were personally discussed with  the patient.  Please see medical record for radiology report.  ____________________________________________   PROCEDURES  Procedure(s) performed:  Marland Kitchen.Marland Kitchen.Laceration Repair  Date/Time: 01/30/2019 5:43 PM Performed by: Willy Eddyobinson, Ayden Hardwick, MD Authorized by: Willy Eddyobinson, Dava Rensch, MD   Consent:    Consent obtained:  Verbal   Consent given by:  Patient   Risks discussed:  Pain, infection, poor cosmetic result, need for additional repair, retained foreign body, tendon damage, vascular damage and nerve damage   Alternatives discussed:  No treatment and delayed treatment Anesthesia (see MAR for exact dosages):    Anesthesia method:  Local infiltration   Local anesthetic:  Bupivacaine 0.5% w/o epi Laceration details:    Location:  Face   Face location:  Nose   Length (cm):  2.5   Depth (mm):  5 Repair type:    Repair type:  Intermediate Pre-procedure details:    Preparation:  Patient was prepped and draped in usual sterile fashion Treatment:    Area cleansed with:  Betadine   Amount of cleaning:  Standard   Irrigation solution:  Sterile saline   Irrigation volume:  50   Irrigation method:  Pressure wash   Visualized foreign bodies/material removed: no   Subcutaneous repair:    Suture size:  5-0   Suture material:  Vicryl   Suture technique:  Simple interrupted   Number of sutures:  2 Skin repair:    Repair method:  Sutures   Suture size:  6-0   Suture material:  Nylon   Number of sutures:  6 Approximation:    Approximation:  Close Post-procedure details:    Dressing:  Antibiotic ointment   Patient tolerance of procedure:  Tolerated well, no immediate complications      Critical Care performed: no ____________________________________________   INITIAL IMPRESSION / ASSESSMENT AND PLAN / ED COURSE  Pertinent labs & imaging results that were available during my care of the patient were reviewed by me and considered in my medical decision making (see chart for details).     DDX: Concussion, laceration, subdural, epidural, contusion, fracture  Martha Chung is a 78 y.o. who presents to the ED with symptoms as described above.  Patient with laceration which was repaired.  CT imaging of the face and neck shows no evidence of acute intracranial abnormality.  No evidence of cervical fracture.  No evidence of any injury.  Purely mechanical fall.  I discussed consistent caution conservative management and signs and symptoms for which she should seek medical care.     The patient was evaluated in  Emergency Department today for the symptoms described in the history of present illness. He/she was evaluated in the context of the global COVID-19 pandemic, which necessitated consideration that the patient might be at risk for infection with the SARS-CoV-2 virus that causes COVID-19. Institutional protocols and algorithms that pertain to the evaluation of patients at risk for COVID-19 are in a state of rapid change based on information released by regulatory bodies including the CDC and federal and state organizations. These policies and algorithms were followed during the patient's care in the ED.  As part of my medical decision making, I reviewed the following data within the electronic MEDICAL RECORD NUMBER Nursing notes reviewed and incorporated, Labs reviewed, notes from prior ED visits and [place] Controlled Substance Database   ____________________________________________   FINAL CLINICAL IMPRESSION(S) / ED DIAGNOSES  Final diagnoses:  Minor head injury, initial encounter  Laceration of nose, initial encounter      NEW MEDICATIONS STARTED DURING THIS VISIT:  New Prescriptions   No medications on file     Note:  This document was prepared using Dragon voice recognition software and may include unintentional dictation errors.    Willy Eddyobinson, Leeandre Nordling, MD 01/30/19 1745

## 2019-01-30 NOTE — Discharge Instructions (Addendum)
You will need to follow-up with PCP or return to the ER for suture removal in 1 week.  Please keep covered as sunlight exposure can worsen scar formation.  He can also use bacitracin or triple antibiotic ointment twice daily.  Return for any fevers drainage pain or redness.  You can expect that you can have a lot of swelling and bruising around your nose tomorrow.

## 2019-08-16 ENCOUNTER — Ambulatory Visit: Payer: Medicare HMO | Attending: Internal Medicine

## 2019-08-16 DIAGNOSIS — Z23 Encounter for immunization: Secondary | ICD-10-CM

## 2019-08-16 NOTE — Progress Notes (Signed)
   Covid-19 Vaccination Clinic  Name:  Martha Chung    MRN: [id number] DOB: [birth year]  08/16/2019  [name] was observed post Covid-19 immunization for 15 minutes without incidence. She was provided with Vaccine Information Sheet and instruction to access the V-Safe system.   [name] was instructed to call 911 with any severe reactions post vaccine: Marland Kitchen Difficulty breathing  . Swelling of your face and throat  . A fast heartbeat  . A bad rash all over your body  . Dizziness and weakness    Immunizations Administered    Name Date Dose VIS Date Route   Pfizer COVID-19 Vaccine 08/16/2019 11:09 AM 0.3 mL 06/12/2019 Intramuscular   Manufacturer: [employer], [employer]   Lot: [id number]   NDC: [address]-1

## 2019-09-11 ENCOUNTER — Inpatient Hospital Stay
Admission: EM | Admit: 2019-09-11 | Discharge: 2019-09-14 | DRG: 389 | Disposition: A | Discharge status: Home / Self Care | Payer: Medicare HMO | Attending: Internal Medicine | Admitting: Internal Medicine

## 2019-09-11 ENCOUNTER — Emergency Department: Payer: Medicare HMO

## 2019-09-11 ENCOUNTER — Other Ambulatory Visit: Payer: Self-pay

## 2019-09-11 DIAGNOSIS — R1013 Epigastric pain: Secondary | ICD-10-CM

## 2019-09-11 DIAGNOSIS — M159 Polyosteoarthritis, unspecified: Secondary | ICD-10-CM | POA: Diagnosis present

## 2019-09-11 DIAGNOSIS — Z91041 Radiographic dye allergy status: Secondary | ICD-10-CM

## 2019-09-11 DIAGNOSIS — Z91013 Allergy to seafood: Secondary | ICD-10-CM

## 2019-09-11 DIAGNOSIS — Z791 Long term (current) use of non-steroidal anti-inflammatories (NSAID): Secondary | ICD-10-CM | POA: Diagnosis not present

## 2019-09-11 DIAGNOSIS — D72829 Elevated white blood cell count, unspecified: Secondary | ICD-10-CM | POA: Diagnosis present

## 2019-09-11 DIAGNOSIS — K219 Gastro-esophageal reflux disease without esophagitis: Secondary | ICD-10-CM | POA: Diagnosis present

## 2019-09-11 DIAGNOSIS — M4854XA Collapsed vertebra, not elsewhere classified, thoracic region, initial encounter for fracture: Secondary | ICD-10-CM | POA: Diagnosis present

## 2019-09-11 DIAGNOSIS — Z8249 Family history of ischemic heart disease and other diseases of the circulatory system: Secondary | ICD-10-CM

## 2019-09-11 DIAGNOSIS — I340 Nonrheumatic mitral (valve) insufficiency: Secondary | ICD-10-CM | POA: Diagnosis present

## 2019-09-11 DIAGNOSIS — E039 Hypothyroidism, unspecified: Secondary | ICD-10-CM | POA: Diagnosis present

## 2019-09-11 DIAGNOSIS — Z825 Family history of asthma and other chronic lower respiratory diseases: Secondary | ICD-10-CM

## 2019-09-11 DIAGNOSIS — Z884 Allergy status to anesthetic agent status: Secondary | ICD-10-CM | POA: Diagnosis not present

## 2019-09-11 DIAGNOSIS — M109 Gout, unspecified: Secondary | ICD-10-CM | POA: Diagnosis present

## 2019-09-11 DIAGNOSIS — Z801 Family history of malignant neoplasm of trachea, bronchus and lung: Secondary | ICD-10-CM

## 2019-09-11 DIAGNOSIS — Z79890 Hormone replacement therapy (postmenopausal): Secondary | ICD-10-CM

## 2019-09-11 DIAGNOSIS — K565 Intestinal adhesions [bands], unspecified as to partial versus complete obstruction: Principal | ICD-10-CM | POA: Diagnosis present

## 2019-09-11 DIAGNOSIS — Z96653 Presence of artificial knee joint, bilateral: Secondary | ICD-10-CM | POA: Diagnosis present

## 2019-09-11 DIAGNOSIS — I5032 Chronic diastolic (congestive) heart failure: Secondary | ICD-10-CM | POA: Diagnosis present

## 2019-09-11 DIAGNOSIS — Z803 Family history of malignant neoplasm of breast: Secondary | ICD-10-CM

## 2019-09-11 DIAGNOSIS — I110 Hypertensive heart disease with heart failure: Secondary | ICD-10-CM | POA: Diagnosis present

## 2019-09-11 DIAGNOSIS — Z885 Allergy status to narcotic agent status: Secondary | ICD-10-CM

## 2019-09-11 DIAGNOSIS — Z881 Allergy status to other antibiotic agents status: Secondary | ICD-10-CM

## 2019-09-11 DIAGNOSIS — M419 Scoliosis, unspecified: Secondary | ICD-10-CM | POA: Diagnosis present

## 2019-09-11 DIAGNOSIS — Z20822 Contact with and (suspected) exposure to covid-19: Secondary | ICD-10-CM | POA: Diagnosis present

## 2019-09-11 DIAGNOSIS — I10 Essential (primary) hypertension: Secondary | ICD-10-CM | POA: Diagnosis present

## 2019-09-11 DIAGNOSIS — Z808 Family history of malignant neoplasm of other organs or systems: Secondary | ICD-10-CM

## 2019-09-11 DIAGNOSIS — Z6841 Body Mass Index (BMI) 40.0 and over, adult: Secondary | ICD-10-CM | POA: Diagnosis not present

## 2019-09-11 DIAGNOSIS — M8580 Other specified disorders of bone density and structure, unspecified site: Secondary | ICD-10-CM | POA: Diagnosis present

## 2019-09-11 DIAGNOSIS — J449 Chronic obstructive pulmonary disease, unspecified: Secondary | ICD-10-CM | POA: Diagnosis present

## 2019-09-11 DIAGNOSIS — Z87891 Personal history of nicotine dependence: Secondary | ICD-10-CM

## 2019-09-11 DIAGNOSIS — K56609 Unspecified intestinal obstruction, unspecified as to partial versus complete obstruction: Secondary | ICD-10-CM | POA: Diagnosis present

## 2019-09-11 LAB — TYPE AND SCREEN
ABO/RH(D): A POS
Antibody Screen: NEGATIVE

## 2019-09-11 LAB — COMPREHENSIVE METABOLIC PANEL
ALT: 12 U/L (ref 0–44)
AST: 28 U/L (ref 15–41)
Albumin: 3.8 g/dL (ref 3.5–5.0)
Alkaline Phosphatase: 57 U/L (ref 38–126)
Anion gap: 8 (ref 5–15)
BUN: 29 mg/dL — ABNORMAL HIGH (ref 8–23)
CO2: 26 mmol/L (ref 22–32)
Calcium: 9.1 mg/dL (ref 8.9–10.3)
Chloride: 105 mmol/L (ref 98–111)
Creatinine, Ser: 0.9 mg/dL (ref 0.44–1.00)
GFR calc Af Amer: >60 mL/min (ref >60)
GFR calc non Af Amer: >60 mL/min (ref >60)
Glucose, Bld: 150 mg/dL — ABNORMAL HIGH (ref 70–99)
Potassium: 4.5 mmol/L (ref 3.5–5.1)
Sodium: 139 mmol/L (ref 135–145)
Total Bilirubin: 1.1 mg/dL (ref 0.3–1.2)
Total Protein: 6.8 g/dL (ref 6.5–8.1)

## 2019-09-11 LAB — CBC WITH DIFFERENTIAL/PLATELET
Abs Immature Granulocytes: 0.06 10*3/uL (ref 0.00–0.07)
Basophils Absolute: 0.1 10*3/uL (ref 0.0–0.1)
Basophils Relative: 0 %
Eosinophils Absolute: 0.1 10*3/uL (ref 0.0–0.5)
Eosinophils Relative: 1 %
HCT: 44 % (ref 36.0–46.0)
Hemoglobin: 14.2 g/dL (ref 12.0–15.0)
Immature Granulocytes: 1 %
Lymphocytes Relative: 8 %
Lymphs Abs: 1 10*3/uL (ref 0.7–4.0)
MCH: 30.5 pg (ref 26.0–34.0)
MCHC: 32.3 g/dL (ref 30.0–36.0)
MCV: 94.6 fL (ref 80.0–100.0)
Monocytes Absolute: 0.9 10*3/uL (ref 0.1–1.0)
Monocytes Relative: 7 %
Neutro Abs: 9.9 10*3/uL — ABNORMAL HIGH (ref 1.7–7.7)
Neutrophils Relative %: 83 %
Platelets: 172 10*3/uL (ref 150–400)
RBC: 4.65 MIL/uL (ref 3.87–5.11)
RDW: 13.6 % (ref 11.5–15.5)
WBC: 12 10*3/uL — ABNORMAL HIGH (ref 4.0–10.5)
nRBC: 0 % (ref 0.0–0.2)

## 2019-09-11 LAB — BRAIN NATRIURETIC PEPTIDE: B Natriuretic Peptide: 92 pg/mL (ref 0.0–100.0)

## 2019-09-11 LAB — PROTIME-INR
INR: 1 (ref 0.8–1.2)
Prothrombin Time: 12.9 seconds (ref 11.4–15.2)

## 2019-09-11 LAB — RESPIRATORY PANEL BY RT PCR (FLU A&B, COVID)
Influenza A by PCR: NEGATIVE
Influenza B by PCR: NEGATIVE
SARS Coronavirus 2 by RT PCR: NEGATIVE

## 2019-09-11 LAB — APTT: aPTT: 30 seconds (ref 24–36)

## 2019-09-11 LAB — LIPASE, BLOOD: Lipase: 37 U/L (ref 11–51)

## 2019-09-11 MED ORDER — ONDANSETRON HCL 4 MG/2ML IJ SOLN
4.0000 mg | Freq: Three times a day (TID) | INTRAMUSCULAR | Status: DC | PRN
  Administered 2019-09-11: 4 mg via INTRAVENOUS
  Filled 2019-09-11: qty 2

## 2019-09-11 MED ORDER — ACETAMINOPHEN 650 MG RE SUPP: 650.0000 mg | Freq: Four times a day (QID) | RECTAL | Status: DC | PRN

## 2019-09-11 MED ORDER — FENTANYL CITRATE (PF) 100 MCG/2ML IJ SOLN
50.0000 ug | Freq: Once | INTRAMUSCULAR | Status: AC
  Administered 2019-09-11: 50 ug via INTRAVENOUS
  Filled 2019-09-11: qty 2

## 2019-09-11 MED ORDER — ONDANSETRON HCL 4 MG/2ML IJ SOLN
4.0000 mg | Freq: Once | INTRAMUSCULAR | Status: AC
  Administered 2019-09-11: 06:00:00 4 mg via INTRAVENOUS
  Filled 2019-09-11: qty 2

## 2019-09-11 MED ORDER — FAMOTIDINE IN NACL 20-0.9 MG/50ML-% IV SOLN
20.0000 mg | Freq: Two times a day (BID) | INTRAVENOUS | Status: DC
  Administered 2019-09-11 – 2019-09-14 (×7): 20 mg via INTRAVENOUS
  Filled 2019-09-11 (×7): qty 50

## 2019-09-11 MED ORDER — SODIUM CHLORIDE 0.9 % IV SOLN: INTRAVENOUS | Status: DC

## 2019-09-11 MED ORDER — FENTANYL CITRATE (PF) 100 MCG/2ML IJ SOLN
12.5000 ug | INTRAMUSCULAR | Status: DC | PRN
  Administered 2019-09-11 – 2019-09-13 (×6): 12.5 ug via INTRAVENOUS
  Filled 2019-09-11 (×6): qty 2

## 2019-09-11 MED ORDER — HYDRALAZINE HCL 20 MG/ML IJ SOLN: 5.0000 mg | INTRAMUSCULAR | Status: DC | PRN

## 2019-09-11 MED ORDER — PANTOPRAZOLE SODIUM 40 MG IV SOLR
40.0000 mg | Freq: Once | INTRAVENOUS | Status: AC
Start: 2019-09-11 — End: 2019-09-11
  Administered 2019-09-11: 40 mg via INTRAVENOUS
  Filled 2019-09-11: qty 40

## 2019-09-11 MED ORDER — ALBUTEROL SULFATE HFA 108 (90 BASE) MCG/ACT IN AERS
2.0000 | INHALATION_SPRAY | RESPIRATORY_TRACT | Status: DC | PRN
  Filled 2019-09-11: qty 6.7

## 2019-09-11 MED ORDER — LEVOTHYROXINE SODIUM 100 MCG/5ML IV SOLN
37.5000 ug | Freq: Every day | INTRAVENOUS | Status: DC
  Administered 2019-09-11 – 2019-09-14 (×4): 37.5 ug via INTRAVENOUS
  Filled 2019-09-11 (×4): qty 5

## 2019-09-11 NOTE — ED Notes (Signed)
Updated patient's daughter on pending lab results, told her nursing staff would update her with plan of care

## 2019-09-11 NOTE — ED Provider Notes (Addendum)
[facility] Emergency Department Provider Note  ____________________________________________  Time seen: Approximately 6:21 AM  I have reviewed the triage vital signs and the nursing notes.   HISTORY  Chief Complaint Abdominal Pain    HPI [name] is a 79 y.o. female with a history of COPD CHF hypertension  who complains of upper abdominal pain started after dinner last night.  Constant, nonradiating, no aggravating or alleviating factors.  Associated nausea and vomiting.  No fever.     Past Medical History:  Diagnosis Date  . Arthritis    hands, feet, "all over"  . CHF (congestive heart failure) (HCC)    acute, after use of Redux  . Complication of anesthesia    on vent after colostomy revision  . COPD (chronic obstructive pulmonary disease) (HCC)    chronic bronchitis - no meds  . Diverticulitis   . Full dentures    upper and lower  . GERD (gastroesophageal reflux disease)   . Gout   . Hypertension   . Hypothyroidism   . Lichen sclerosus    Vaginal Area  . Mitral regurgitation    after use of redux  . Right rotator cuff tendonitis   . Wears contact lenses      Patient Active Problem List   Diagnosis Date Noted  . Adjustment disorder with mixed anxiety and depressed mood 02/17/2015  . Other postoperative complication involving digestive system   . 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 Arthritis 02/12/2015  . BP (high blood pressure) 02/12/2015  . Diverticulitis large intestine 01/31/2015  . Diverticulitis 12/07/2014  . 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  . Essential (primary) hypertension 03/31/2014  . Acid reflux 03/31/2014  . Gout 03/31/2014  . Adult hypothyroidism 03/31/2014  . Osteopenia 03/31/2014     Past Surgical History:  Procedure Laterality Date  . ABDOMINAL HYSTERECTOMY  1967   Partial  . APPLICATION OF WOUND VAC  02/14/2015   Procedure: APPLICATION OF WOUND VAC;  Surgeon: [name], MD;  Location:  [facility] ORS;  Service: General;;  . CHOLECYSTECTOMY    . COLECTOMY WITH COLOSTOMY CREATION/HARTMANN PROCEDURE  07/03/14  . COLONOSCOPY N/A 11/25/2014   Procedure: COLONOSCOPY;  Surgeon: Midge Minium, MD;  Location: [facility];  Service: Gastroenterology;  Laterality: N/A;  . COLOSTOMY TAKEDOWN N/A 01/31/2015   Procedure: COLOSTOMY TAKEDOWN, partial colectomy;  Surgeon: [name], MD;  Location: [facility] ORS;  Service: General;  Laterality: N/A;  . CORRECTION OVERLAPPING TOES Bilateral 2012   bilateral hammertoes #2 & #3 both feet  . JOINT REPLACEMENT Right 2006   knee  . JOINT REPLACEMENT Left 2008   knee  . KIDNEY SURGERY Right   . LAPAROTOMY     construct new colostomy and place wound VAC  . LAPAROTOMY N/A 02/14/2015   Procedure: RE-OPEN EXPLORATORY LAPAROTOMY, COMPLEX LYSIS OF ADHESIONS, CONSTRUCTION OF LOOP ILEOSTOMY,;  Surgeon: [name], MD;  Location: [facility] ORS;  Service: General;  Laterality: N/A;  . POLYPECTOMY  11/25/2014   Procedure: POLYPECTOMY INTESTINAL;  Surgeon: Midge Minium, MD;  Location: [facility];  Service: Gastroenterology;;  . TONSILLECTOMY       Prior to Admission medications   Medication Sig Start Date End Date Taking? Authorizing Provider  acetaminophen (TYLENOL) 325 MG tablet Take 2 tablets (650 mg total) by mouth every 6 (six) hours as needed for mild pain (or temp > 100). 03/03/15   Ida Rogue, MD  allopurinol (ZYLOPRIM) 100 MG tablet Take 100 mg by mouth daily.  AM    [provider]  butalbital-acetaminophen-caffeine (FIORICET) [phone] MG tablet Take 1 tablet by mouth every 6 (six) hours as needed for headache. 01/30/19 01/30/20  Willy Eddy, MD  Calcium Carb-Cholecalciferol (CALCIUM 600 + D PO) Take 600 mg elemental calcium/kg/hr by mouth 2 (two) times daily.    [provider]  clobetasol cream (TEMOVATE) 0.05 % Apply 1 application topically 2 (two) times daily.    [provider]  diphenoxylate-atropine  (LOMOTIL) 2.5-0.025 MG tablet Take 1 tablet by mouth 4 (four) times daily as needed for diarrhea or loose stools. 04/13/15   Ida Rogue, MD  famotidine (PEPCID) 20 MG tablet Take 20 mg by mouth 2 (two) times daily.    [provider]  furosemide (LASIX) 20 MG tablet Take 20 mg by mouth every morning. AM    [provider]  levothyroxine (SYNTHROID, LEVOTHROID) 75 MCG tablet Take 75 mcg by mouth daily before breakfast.    [provider]  meloxicam (MOBIC) 7.5 MG tablet Take 1 tablet by mouth. 03/28/15   [provider]  metoprolol (LOPRESSOR) 50 MG tablet Take 50 mg by mouth 2 (two) times daily.    [provider]  Multiple Vitamin (MULTIVITAMIN) tablet Take 1 tablet by mouth daily.    [provider]  mupirocin ointment (BACTROBAN) 2 % Place 1 application into the nose 2 (two) times daily.    [provider]  ondansetron (ZOFRAN) 4 MG tablet Take 1 tablet (4 mg total) by mouth every 8 (eight) hours as needed for nausea. 03/03/15   Ida Rogue, MD  saccharomyces boulardii (FLORASTOR) 250 MG capsule Take 250 mg by mouth 2 (two) times daily.    [provider]  scopolamine (TRANSDERM-SCOP) 1 MG/3DAYS Place 1 patch (1.5 mg total) onto the skin every 3 (three) days. 03/03/15   Ida Rogue, MD  traMADol (ULTRAM) 50 MG tablet Take 1 tablet (50 mg total) by mouth every 6 (six) hours as needed. 07/03/15   Jennye Moccasin, MD     Allergies Ivp dye [iodinated diagnostic agents], Morphine and related, Shellfish allergy, Dilaudid [hydromorphone hcl], Macrodantin [nitrofurantoin], and Nubain [nalbuphine hcl]   Family History  Problem Relation Age of Onset  . Hypertension Mother   . COPD Mother   . Heart disease Mother   . Cancer Mother        Breast  . Heart disease Father   . COPD Father   . Cancer Brother 53       Lung  . Cancer Daughter 7       Brain    Social History Social History    Tobacco Use  . Smoking status: Former Smoker    Quit date: 07/03/1999    Years since quitting: 20.2  . Smokeless tobacco: Never Used  Substance Use Topics  . Alcohol use: No  . Drug use: No    Review of Systems  Constitutional:   No fever or chills.  ENT:   No sore throat. No rhinorrhea. Cardiovascular:   No chest pain or syncope. Respiratory:   No dyspnea or cough. Gastrointestinal:   Positive abdominal pain and vomiting as above. Musculoskeletal:   Negative for focal pain or swelling All other systems reviewed and are negative except as documented above in ROS and HPI.  ____________________________________________   PHYSICAL EXAM:  VITAL SIGNS: ED Triage Vitals  Enc Vitals Group     BP 09/11/19 0547 (!) 171/66     Pulse Rate 09/11/19 0547 71  Resp 09/11/19 0547 20     Temp 09/11/19 0547 98.2 F (36.8 C)     Temp Source 09/11/19 0547 Oral     SpO2 09/11/19 0547 95 %     Weight 09/11/19 0549 216 lb (98 kg)     Height 09/11/19 0549 5\' 1"  (1.549 m)     Head Circumference --      Peak Flow --      Pain Score 09/11/19 0548 8     Pain Loc --      Pain Edu? --      Excl. in GC? --     Vital signs reviewed, nursing assessments reviewed.   Constitutional:   Alert and oriented. Non-toxic appearance. Eyes:   Conjunctivae are normal. EOMI. PERRL. ENT      Head:   Normocephalic and atraumatic.      Nose:   Wearing a mask.      Mouth/Throat:   Wearing a mask.      Neck:   No meningismus. Full ROM. Hematological/Lymphatic/Immunilogical:   No cervical lymphadenopathy. Cardiovascular:   RRR. Symmetric bilateral radial and DP pulses.  No murmurs. Cap refill less than 2 seconds. Respiratory:   Normal respiratory effort without tachypnea/retractions. Breath sounds are clear and equal bilaterally. No wheezes/rales/rhonchi. Gastrointestinal:   Soft with epigastric tenderness. Non distended.  No rebound, rigidity, or guarding. Musculoskeletal:   Normal range of motion in all  extremities. No joint effusions.  No lower extremity tenderness.  No edema. Neurologic:   Normal speech and language.  Motor grossly intact. No acute focal neurologic deficits are appreciated.  Skin:    Skin is warm, dry and intact. No rash noted.  No petechiae, purpura, or bullae.  ____________________________________________    LABS (pertinent positives/negatives) (all labs ordered are listed, but only abnormal results are displayed) Labs Reviewed  CBC WITH DIFFERENTIAL/PLATELET - Abnormal; Notable for the following components:      Result Value   WBC 12.0 (*)    Neutro Abs 9.9 (*)    All other components within normal limits  COMPREHENSIVE METABOLIC PANEL - Abnormal; Notable for the following components:   Glucose, Bld 150 (*)    BUN 29 (*)    All other components within normal limits  RESPIRATORY PANEL BY RT PCR (FLU A&B, COVID)  LIPASE, BLOOD   ____________________________________________   EKG  Interpreted by me Sinus rhythm rate of 72, normal axis and intervals.  Poor R wave progression.  Normal ST segments and T waves.  ____________________________________________    RADIOLOGY  CT ABDOMEN PELVIS WO CONTRAST  Result Date: 09/11/2019 CLINICAL DATA:  Acute nonlocalized abdominal pain. Upper abdominal pain began last night after dinner EXAM: CT ABDOMEN AND PELVIS WITHOUT CONTRAST TECHNIQUE: Multidetector CT imaging of the abdomen and pelvis was performed following the standard protocol without IV contrast. COMPARISON:  07/03/2015 FINDINGS: Lower chest: Streaky opacity in the lower lungs attributed to scarring. Hepatobiliary: No focal liver abnormality.Cholecystectomy with no bile duct dilatation. Pancreas: Unremarkable. Spleen: Unremarkable. Adrenals/Urinary Tract: Negative adrenals. No hydronephrosis or stone. Unremarkable bladder. Stomach/Bowel: Dilated fluid-filled small bowel with distal fecalization an abrupt transition to decompressed bowel. The transition point is  in the low ventral abdomen adjacent to a left-sided hernia mesh. Sigmoidectomy with anastomosis. There is also a small bowel anastomosis that is decompressed. Since prior the right-sided ostomy has been reversed with mesh repair of parastomal hernia. Vascular/Lymphatic: Atherosclerotic calcification. No mass or adenopathy. Reproductive:Hysterectomy Other: No ascites or pneumoperitoneum. Musculoskeletal: Advanced lumbar disc and  facet degeneration with remote T12 compression fracture and kyphoscoliosis. IMPRESSION: High-grade small bowel obstruction with transition in the left paramedian abdomen adjacent to a hernia mesh repair. Electronically Signed   By: Monte Fantasia M.D.   On: 09/11/2019 06:45    ____________________________________________   PROCEDURES Procedures  ____________________________________________  DIFFERENTIAL DIAGNOSIS   GERD/gastritis, pancreatitis, diverticulitis, bowel obstruction.  Doubt AAA dissection or mesenteric ischemia.  CLINICAL IMPRESSION / ASSESSMENT AND PLAN / ED COURSE  Medications ordered in the ED: Medications  ondansetron (ZOFRAN) injection 4 mg (4 mg Intravenous Given 09/11/19 0611)  fentaNYL (SUBLIMAZE) injection 50 mcg (50 mcg Intravenous Given 09/11/19 0611)  pantoprazole (PROTONIX) injection 40 mg (40 mg Intravenous Given 09/11/19 4920)    Pertinent labs & imaging results that were available during my care of the patient were reviewed by me and considered in my medical decision making (see chart for details).  LASHAUNTA SICARD was evaluated in Emergency Department on 09/11/2019 for the symptoms described in the history of present illness. She was evaluated in the context of the global COVID-19 pandemic, which necessitated consideration that the patient might be at risk for infection with the SARS-CoV-2 virus that causes COVID-19. Institutional protocols and algorithms that pertain to the evaluation of patients at risk for COVID-19 are in a state of  rapid change based on information released by regulatory bodies including the CDC and federal and state organizations. These policies and algorithms were followed during the patient's care in the ED.   Patient presents with upper abdominal pain onset after eating.  Tenderness on exam unlikely to be mesenteric ischemia.  Will obtain CT scan without contrast due to her severe contrast allergy.  Check labs.  Will control symptoms with IV Zofran, Protonix, and 50 mcg fentanyl for pain relief.  Clinical Course as of Sep 10 717  Fri Sep 11, 2019  0718 Patient's daughter updated at patient's request.  CT scan shows small bowel obstruction.  Surgery Dr. Lysle Pearl agrees with NG tube and hospitalist admission.   [PS]    Clinical Course User Index [PS] Carrie Mew, MD     ____________________________________________   FINAL CLINICAL IMPRESSION(S) / ED DIAGNOSES    Final diagnoses:  Epigastric pain  Small bowel obstruction [facility])     ED Discharge Orders    None      Portions of this note were generated with dragon dictation software. Dictation errors may occur despite best attempts at proofreading.   Carrie Mew, MD 09/11/19 1007    Carrie Mew, MD 09/11/19 [phone]

## 2019-09-11 NOTE — Consult Note (Signed)
Subjective:   CC: SBO  HPI:  Martha Chung is a 79 y.o. female who was consulted by Clyde Lundborg for issue above.  Symptoms were first noted a few hours ago. Pain is sharp, intense, in abdomen. Associated with nausea vomiting, exacerbated by nothing specific.  Last BM shortly after pain started, but no passing flatus since then.     Past Medical History:  has a past medical history of Arthritis, CHF (congestive heart failure) (HCC), Complication of anesthesia, COPD (chronic obstructive pulmonary disease) (HCC), Diverticulitis, Full dentures, GERD (gastroesophageal reflux disease), Gout, Hypertension, Hypothyroidism, Lichen sclerosus, Mitral regurgitation, Right rotator cuff tendonitis, and Wears contact lenses.  Past Surgical History:  Past Surgical History:  Procedure Laterality Date  . ABDOMINAL HYSTERECTOMY  1967   Partial  . APPLICATION OF WOUND VAC  02/14/2015   Procedure: APPLICATION OF WOUND VAC;  Surgeon: [name], MD;  Location: [facility] ORS;  Service: General;;  . CHOLECYSTECTOMY    . COLECTOMY WITH COLOSTOMY CREATION/HARTMANN PROCEDURE  07/03/14  . COLONOSCOPY N/A 11/25/2014   Procedure: COLONOSCOPY;  Surgeon: Midge Minium, MD;  Location: [facility];  Service: Gastroenterology;  Laterality: N/A;  . COLOSTOMY TAKEDOWN N/A 01/31/2015   Procedure: COLOSTOMY TAKEDOWN, partial colectomy;  Surgeon: [name], MD;  Location: [facility] ORS;  Service: General;  Laterality: N/A;  . CORRECTION OVERLAPPING TOES Bilateral 2012   bilateral hammertoes #2 & #3 both feet  . JOINT REPLACEMENT Right 2006   knee  . JOINT REPLACEMENT Left 2008   knee  . KIDNEY SURGERY Right   . LAPAROTOMY     construct new colostomy and place wound VAC  . LAPAROTOMY N/A 02/14/2015   Procedure: RE-OPEN EXPLORATORY LAPAROTOMY, COMPLEX LYSIS OF ADHESIONS, CONSTRUCTION OF LOOP ILEOSTOMY,;  Surgeon: [name], MD;  Location: [facility] ORS;  Service: General;  Laterality: N/A;  . POLYPECTOMY  11/25/2014   Procedure: POLYPECTOMY  INTESTINAL;  Surgeon: Midge Minium, MD;  Location: [facility];  Service: Gastroenterology;;  . TONSILLECTOMY      Family History: family history includes COPD in her father and mother; Cancer in her mother; Cancer (age of onset: 56) in her brother; Cancer (age of onset: 79) in her daughter; Heart disease in her father and mother; Hypertension in her mother.  Social History:  reports that she quit smoking about 20 years ago. She has never used smokeless tobacco. She reports that she does not drink alcohol or use drugs.  Current Medications: (Not in a hospital admission)   Allergies:  Allergies as of 09/11/2019 - Review Complete 09/11/2019  Allergen Reaction Noted  . Ivp dye [iodinated diagnostic agents] Anaphylaxis 11/23/2014  . Morphine and related Anaphylaxis 11/23/2014  . Shellfish allergy Anaphylaxis 11/23/2014  . Dilaudid [hydromorphone hcl]  01/30/2019  . Macrodantin [nitrofurantoin] Nausea And Vomiting 11/23/2014  . Nubain [nalbuphine hcl] Other (See Comments) 11/23/2014    ROS:  General: Denies weight loss, weight gain, fatigue, fevers, chills, and night sweats. Eyes: Denies blurry vision, double vision, eye pain, itchy eyes, and tearing. Ears: Denies hearing loss, earache, and ringing in ears. Nose: Denies sinus pain, congestion, infections, runny nose, and nosebleeds. Mouth/throat: Denies hoarseness, sore throat, bleeding gums, and difficulty swallowing. Heart: Denies chest pain, palpitations, racing heart, irregular heartbeat, leg pain or swelling, and decreased activity tolerance. Respiratory: Denies breathing difficulty, shortness of breath, wheezing, cough, and sputum. GI: Denies change in appetite, heartburn, constipation, diarrhea, and blood in stool. GU: Denies difficulty urinating, pain with urinating, urgency, frequency, blood in urine. Musculoskeletal: Denies  joint stiffness, pain, swelling, muscle weakness. Skin: Denies rash, itching, mass, tumors, sores,  and boils Neurologic: Denies headache, fainting, dizziness, seizures, numbness, and tingling. Psychiatric: Denies depression, anxiety, difficulty sleeping, and memory loss. Endocrine: Denies heat or cold intolerance, and increased thirst or urination. Blood/lymph: Denies easy bruising, easy bruising, and swollen glands     Objective:     BP (!) 149/63   Pulse 62   Temp 98.2 F (36.8 C) (Oral)   Resp 19   Ht 5\' 1"  (1.549 m)   Wt 98 kg   SpO2 (!) 88%   BMI 40.81 kg/m   Constitutional :  alert, cooperative, appears stated age and no distress  Lymphatics/Throat:  no asymmetry, masses, or scars  Respiratory:  clear to auscultation bilaterally  Cardiovascular:  regular rate and rhythm  Gastrointestinal: soft, no specific guarding, but diffuse TTP all four quadrants.   Musculoskeletal: Steady movement  Skin: Cool and moist   Psychiatric: Normal affect, non-agitated, not confused       LABS:  CMP Latest Ref Rng & Units 09/11/2019 09/14/2015 07/03/2015  Glucose 70 - 99 mg/dL 150(H) - 115(H)  BUN 8 - 23 mg/dL 29(H) 22(H) 27(H)  Creatinine 0.44 - 1.00 mg/dL 0.90 0.87 0.89  Sodium 135 - 145 mmol/L 139 - 142  Potassium 3.5 - 5.1 mmol/L 4.5 - 3.7  Chloride 98 - 111 mmol/L 105 - 108  CO2 22 - 32 mmol/L 26 - 26  Calcium 8.9 - 10.3 mg/dL 9.1 - 9.1  Total Protein 6.5 - 8.1 g/dL 6.8 - 7.4  Total Bilirubin 0.3 - 1.2 mg/dL 1.1 - 0.8  Alkaline Phos 38 - 126 U/L 57 - 90  AST 15 - 41 U/L 28 - 24  ALT 0 - 44 U/L 12 - 18   CBC Latest Ref Rng & Units 09/11/2019 09/14/2015 07/03/2015  WBC 4.0 - 10.5 K/uL 12.0(H) 7.6 7.8  Hemoglobin 12.0 - 15.0 g/dL 14.2 13.1 13.7  Hematocrit 36.0 - 46.0 % 44.0 40.3 42.0  Platelets 150 - 400 K/uL 172 221 166    RADS: CLINICAL DATA:  Acute nonlocalized abdominal pain. Upper abdominal pain began last night after dinner  EXAM: CT ABDOMEN AND PELVIS WITHOUT CONTRAST  TECHNIQUE: Multidetector CT imaging of the abdomen and pelvis was performed following the  standard protocol without IV contrast.  COMPARISON:  07/03/2015  FINDINGS: Lower chest: Streaky opacity in the lower lungs attributed to scarring.  Hepatobiliary: No focal liver abnormality.Cholecystectomy with no bile duct dilatation.  Pancreas: Unremarkable.  Spleen: Unremarkable.  Adrenals/Urinary Tract: Negative adrenals. No hydronephrosis or stone. Unremarkable bladder.  Stomach/Bowel: Dilated fluid-filled small bowel with distal fecalization an abrupt transition to decompressed bowel. The transition point is in the low ventral abdomen adjacent to a left-sided hernia mesh.  Sigmoidectomy with anastomosis. There is also a small bowel anastomosis that is decompressed. Since prior the right-sided ostomy has been reversed with mesh repair of parastomal hernia.  Vascular/Lymphatic: Atherosclerotic calcification. No mass or adenopathy.  Reproductive:Hysterectomy  Other: No ascites or pneumoperitoneum.  Musculoskeletal: Advanced lumbar disc and facet degeneration with remote T12 compression fracture and kyphoscoliosis.  IMPRESSION: High-grade small bowel obstruction with transition in the left paramedian abdomen adjacent to a hernia mesh repair.   Electronically Signed   By: Monte Fantasia M.D.   On: 09/11/2019 06:45  Assessment:   SBO, likely adhesions from previous surgery Morbid obesity HTN   Plan:    Pt high risk candidate for surgical intervention due to complicated surgical history  and morbid obesity.  NG tube decompressing well with output on initial placement.  Will monitor now with serial abdominal exams to see if we can avoid high risk surgery. Pt verbalized understanding.  NPO, IVF, medical support per primary

## 2019-09-11 NOTE — ED Notes (Signed)
Patient transported to CT 

## 2019-09-11 NOTE — H&P (Signed)
History and Physical    Martha Chung MVH:[id number] DOB: 10/11/1940 DOA: 09/11/2019  Referring MD/NP/PA:   PCP: Clent Jacks, PA-C   Patient coming from:  The patient is coming from home.  At baseline, pt is independent for most of ADL.        Chief Complaint: Nausea, vomiting, abdominal pain  HPI: Martha Chung is a 79 y.o. female with medical history significant of hypertension, COPD, GERD, hypothyroidism, gout, MVR, dCHF, lichen sclerosis, who presents with nausea, vomiting, abdominal pain.  Patient states that her abdominal pain started after dinner last night.  It is located in central abdomen, constant, moderate, sharp, nonradiating.  Associated with nausea and nonbilious nonbloody vomiting.  No diarrhea.  No fever or chills.  Patient does not have chest pain, shortness breath, cough.  No symptoms of UTI or unilateral weakness. PT states she still has BM, but it is harder to pass.   ED Course: pt was found to have WBC 12.0, pending Covid PCR, lipase 37, electrolytes renal function okay, temperature normal, blood pressure 171/66, heart rate 60, oxygen saturation 95% on room air.  CT abdomen/pelvis showed a high-grade small bowel obstruction.  Patient is admitted to MedSurg bed as inpatient.  General surgeon, Dr. Tonna Boehringer is consulted.  Review of Systems:   General: no fevers, chills, no body weight gain, has poor appetite, has fatigue HEENT: no blurry vision, hearing changes or sore throat Respiratory: no dyspnea, coughing, wheezing CV: no chest pain, no palpitations GI: has nausea, vomiting, abdominal pain, no diarrhea, constipation GU: no dysuria, burning on urination, increased urinary frequency, hematuria  Ext: no leg edema Neuro: no unilateral weakness, numbness, or tingling, no vision change or hearing loss Skin: no rash, no skin tear. MSK: No muscle spasm, no deformity, no limitation of range of movement in spin Heme: No easy bruising.  Travel history: No recent  long distant travel.  Allergy:  Allergies  Allergen Reactions   Ivp Dye [Iodinated Diagnostic Agents] Anaphylaxis    Topical betadine is ok.   Morphine And Related Anaphylaxis    Respiratory Arrest   Shellfish Allergy Anaphylaxis   Dilaudid [Hydromorphone Hcl]     BP bottomed out   Macrodantin [Nitrofurantoin] Nausea And Vomiting    And diarrhea   Nubain [Nalbuphine Hcl] Other (See Comments)    BP "bottoms out"    Past Medical History:  Diagnosis Date   Arthritis    hands, feet, "all over"   CHF (congestive heart failure) (HCC)    acute, after use of Redux   Complication of anesthesia    on vent after colostomy revision   COPD (chronic obstructive pulmonary disease) (HCC)    chronic bronchitis - no meds   Diverticulitis    Full dentures    upper and lower   GERD (gastroesophageal reflux disease)    Gout    Hypertension    Hypothyroidism    Lichen sclerosus    Vaginal Area   Mitral regurgitation    after use of redux   Right rotator cuff tendonitis    Wears contact lenses     Past Surgical History:  Procedure Laterality Date   ABDOMINAL HYSTERECTOMY  1967   Partial   APPLICATION OF WOUND VAC  02/14/2015   Procedure: APPLICATION OF WOUND VAC;  Surgeon: [name], MD;  Location: [facility];  Service: General;;   CHOLECYSTECTOMY     COLECTOMY WITH COLOSTOMY CREATION/HARTMANN PROCEDURE  07/03/14   COLONOSCOPY N/A 11/25/2014   Procedure: COLONOSCOPY;  Surgeon: Midge Minium, MD;  Location: [facility];  Service: Gastroenterology;  Laterality: N/A;   COLOSTOMY TAKEDOWN N/A 01/31/2015   Procedure: COLOSTOMY TAKEDOWN, partial colectomy;  Surgeon: [name], MD;  Location: [facility];  Service: General;  Laterality: N/A;   CORRECTION OVERLAPPING TOES Bilateral 2012   bilateral hammertoes #2 & #3 both feet   JOINT REPLACEMENT Right 2006   knee   JOINT REPLACEMENT Left 2008   knee   KIDNEY SURGERY Right    LAPAROTOMY     construct new  colostomy and place wound VAC   LAPAROTOMY N/A 02/14/2015   Procedure: RE-OPEN EXPLORATORY LAPAROTOMY, COMPLEX LYSIS OF ADHESIONS, CONSTRUCTION OF LOOP ILEOSTOMY,;  Surgeon: [name], MD;  Location: [facility];  Service: General;  Laterality: N/A;   POLYPECTOMY  11/25/2014   Procedure: POLYPECTOMY INTESTINAL;  Surgeon: Midge Minium, MD;  Location: [facility];  Service: Gastroenterology;;   TONSILLECTOMY      Social History:  reports that she quit smoking about 20 years ago. She has never used smokeless tobacco. She reports that she does not drink alcohol or use drugs.  Family History:  Family History  Problem Relation Age of Onset   Hypertension Mother    COPD Mother    Heart disease Mother    Cancer Mother        Breast   Heart disease Father    COPD Father    Cancer Brother 61       Lung   Cancer Daughter 7       Brain     Prior to Admission medications   Medication Sig Start Date End Date Taking? Authorizing Provider  allopurinol (ZYLOPRIM) 100 MG tablet Take 100 mg by mouth daily. AM   Yes [provider]  Calcium Carb-Cholecalciferol (CALCIUM 600 + D PO) Take 600 mg elemental calcium/kg/hr by mouth daily.    Yes [provider]  clobetasol cream (TEMOVATE) 0.05 % Apply 1 application topically 2 (two) times daily.   Yes [provider]  diphenoxylate-atropine (LOMOTIL) 2.5-0.025 MG tablet Take 1 tablet by mouth 4 (four) times daily as needed for diarrhea or loose stools. 04/13/15  Yes Ida Rogue, MD  famotidine (PEPCID) 20 MG tablet Take 20 mg by mouth 2 (two) times daily.   Yes [provider]  furosemide (LASIX) 20 MG tablet Take 20 mg by mouth every morning. AM   Yes [provider]  levothyroxine (SYNTHROID) 88 MCG tablet Take 88 mcg by mouth daily before breakfast.    Yes [provider]  meloxicam (MOBIC) 7.5 MG tablet Take 1 tablet by mouth 2 (two) times daily.  03/28/15  Yes [provider]  metoprolol (LOPRESSOR) 50 MG tablet Take 50 mg by mouth 2 (two) times daily.   Yes [provider]  Multiple Vitamin (MULTIVITAMIN) tablet Take 1 tablet by mouth daily.   Yes [provider]    Physical Exam: Vitals:   09/11/19 0547 09/11/19 0549 09/11/19 0653 09/11/19 0800  BP: (!) 171/66   (!) 149/63  Pulse: 71  60 62  Resp: 20  16 19   Temp: 98.2 F (36.8 C)     TempSrc: Oral     SpO2: 95%  95% (!) 88%  Weight:  98 kg    Height:  5\' 1"  (1.549 m)     General: Not in acute distress HEENT:       Eyes: PERRL, EOMI, no scleral icterus.       ENT:  No discharge from the ears and nose, no pharynx injection, no tonsillar enlargement.        Neck: No JVD, no bruit, no mass felt. Heme: No neck lymph node enlargement. Cardiac: S1/S2, RRR, No gallops or rubs. Respiratory: No rales, wheezing, rhonchi or rubs. GI: Soft, nondistended, has tenderness in central abdomen, no rebound pain, no organomegaly, BS present. GU: No hematuria Ext: No pitting leg edema bilaterally. 2+DP/PT pulse bilaterally. Musculoskeletal: No joint deformities, No joint redness or warmth, no limitation of ROM in spin. Skin: No rashes.  Neuro: Alert, oriented X3, cranial nerves II-XII grossly intact, moves all extremities normally.  Psych: Patient is not psychotic, no suicidal or hemocidal ideation.  Labs on Admission: I have personally reviewed following labs and imaging studies  CBC: Recent Labs  Lab 09/11/19 0555  WBC 12.0*  NEUTROABS 9.9*  HGB 14.2  HCT 44.0  MCV 94.6  PLT 811   Basic Metabolic Panel: Recent Labs  Lab 09/11/19 0555  NA 139  K 4.5  CL 105  CO2 26  GLUCOSE 150*  BUN 29*  CREATININE 0.90  CALCIUM 9.1   GFR: Estimated Creatinine Clearance: 54.3 mL/min (by C-G formula based on SCr of 0.9 mg/dL). Liver Function Tests: Recent Labs  Lab 09/11/19 0555  AST 28  ALT 12  ALKPHOS 57  BILITOT 1.1  PROT 6.8  ALBUMIN 3.8   Recent Labs  Lab  09/11/19 0555  LIPASE 37   No results for input(s): AMMONIA in the last 168 hours. Coagulation Profile: No results for input(s): INR, PROTIME in the last 168 hours. Cardiac Enzymes: No results for input(s): CKTOTAL, CKMB, CKMBINDEX, TROPONINI in the last 168 hours. BNP (last 3 results) No results for input(s): PROBNP in the last 8760 hours. HbA1C: No results for input(s): HGBA1C in the last 72 hours. CBG: No results for input(s): GLUCAP in the last 168 hours. Lipid Profile: No results for input(s): CHOL, HDL, LDLCALC, TRIG, CHOLHDL, LDLDIRECT in the last 72 hours. Thyroid Function Tests: No results for input(s): TSH, T4TOTAL, FREET4, T3FREE, THYROIDAB in the last 72 hours. Anemia Panel: No results for input(s): VITAMINB12, FOLATE, FERRITIN, TIBC, IRON, RETICCTPCT in the last 72 hours. Urine analysis:    Component Value Date/Time   COLORURINE YELLOW (A) 07/03/2015 1405   APPEARANCEUR CLEAR (A) 07/03/2015 1405   APPEARANCEUR Hazy 06/25/2014 1223   LABSPEC 1.020 07/03/2015 1405   LABSPEC 1.002 06/25/2014 1223   PHURINE 5.0 07/03/2015 1405   GLUCOSEU NEGATIVE 07/03/2015 1405   GLUCOSEU Negative 06/25/2014 1223   HGBUR 1+ (A) 07/03/2015 1405   BILIRUBINUR NEGATIVE 07/03/2015 1405   BILIRUBINUR Negative 06/25/2014 1223   KETONESUR NEGATIVE 07/03/2015 1405   PROTEINUR NEGATIVE 07/03/2015 1405   NITRITE POSITIVE (A) 07/03/2015 1405   LEUKOCYTESUR 1+ (A) 07/03/2015 1405   LEUKOCYTESUR 3+ 06/25/2014 1223   Sepsis Labs: @LABRCNTIP (procalcitonin:4,lacticidven:4) ) Recent Results (from the past 240 hour(s))  Respiratory Panel by RT PCR (Flu A&B, Covid) - Nasopharyngeal Swab     Status: None   Collection Time: 09/11/19  7:16 AM   Specimen: Nasopharyngeal Swab  Result Value Ref Range Status   SARS Coronavirus 2 by RT PCR NEGATIVE NEGATIVE Final    Comment: (NOTE) SARS-CoV-2 target nucleic acids are NOT DETECTED. The SARS-CoV-2 RNA is generally detectable in upper  respiratoy specimens during the acute phase of infection. The lowest concentration of SARS-CoV-2 viral copies this assay can detect is 131 copies/mL. A negative result does not preclude SARS-Cov-2 infection and should not be  used as the sole basis for treatment or other patient management decisions. A negative result may occur with  improper specimen collection/handling, submission of specimen other than nasopharyngeal swab, presence of viral mutation(s) within the areas targeted by this assay, and inadequate number of viral copies (<131 copies/mL). A negative result must be combined with clinical observations, patient history, and epidemiological information. The expected result is Negative. Fact Sheet for Patients:  https://www.moore.com/ Fact Sheet for Healthcare Providers:  https://www.[place].biz/ This test is not yet ap proved or cleared by the [place] FDA and  has been authorized for detection and/or diagnosis of SARS-CoV-2 by FDA under an Emergency Use Authorization (EUA). This EUA will remain  in effect (meaning this test can be used) for the duration of the COVID-19 declaration under Section 564(b)(1) of the Act, 21 U.S.C. section 360bbb-3(b)(1), unless the authorization is terminated or revoked sooner.    Influenza A by PCR NEGATIVE NEGATIVE Final   Influenza B by PCR NEGATIVE NEGATIVE Final    Comment: (NOTE) The Xpert Xpress SARS-CoV-2/FLU/RSV assay is intended as an aid in  the diagnosis of influenza from Nasopharyngeal swab specimens and  should not be used as a sole basis for treatment. Nasal washings and  aspirates are unacceptable for Xpert Xpress SARS-CoV-2/FLU/RSV  testing. Fact Sheet for Patients: https://www.moore.com/ Fact Sheet for Healthcare Providers: https://www.[place].biz/ This test is not yet approved or cleared by the [place] FDA and  has been authorized for  detection and/or diagnosis of SARS-CoV-2 by  FDA under an Emergency Use Authorization (EUA). This EUA will remain  in effect (meaning this test can be used) for the duration of the  Covid-19 declaration under Section 564(b)(1) of the Act, 21  U.S.C. section 360bbb-3(b)(1), unless the authorization is  terminated or revoked. Performed at [facility], [address] [place], [place] [address]      Radiological Exams on Admission: CT ABDOMEN PELVIS WO CONTRAST  Result Date: 09/11/2019 CLINICAL DATA:  Acute nonlocalized abdominal pain. Upper abdominal pain began last night after dinner EXAM: CT ABDOMEN AND PELVIS WITHOUT CONTRAST TECHNIQUE: Multidetector CT imaging of the abdomen and pelvis was performed following the standard protocol without IV contrast. COMPARISON:  07/03/2015 FINDINGS: Lower chest: Streaky opacity in the lower lungs attributed to scarring. Hepatobiliary: No focal liver abnormality.Cholecystectomy with no bile duct dilatation. Pancreas: Unremarkable. Spleen: Unremarkable. Adrenals/Urinary Tract: Negative adrenals. No hydronephrosis or stone. Unremarkable bladder. Stomach/Bowel: Dilated fluid-filled small bowel with distal fecalization an abrupt transition to decompressed bowel. The transition point is in the low ventral abdomen adjacent to a left-sided hernia mesh. Sigmoidectomy with anastomosis. There is also a small bowel anastomosis that is decompressed. Since prior the right-sided ostomy has been reversed with mesh repair of parastomal hernia. Vascular/Lymphatic: Atherosclerotic calcification. No mass or adenopathy. Reproductive:Hysterectomy Other: No ascites or pneumoperitoneum. Musculoskeletal: Advanced lumbar disc and facet degeneration with remote T12 compression fracture and kyphoscoliosis. IMPRESSION: High-grade small bowel obstruction with transition in the left paramedian abdomen adjacent to a hernia mesh repair. Electronically Signed   By: Marnee Spring M.D.    On: 09/11/2019 06:45     EKG:   Not done in ED, will get one.   Assessment/Plan Principal Problem:   SBO (small bowel obstruction) (HCC) Active Problems:   Essential (primary) hypertension   Acid reflux   Gout   Adult hypothyroidism   COPD (chronic obstructive pulmonary disease) (HCC)   Chronic diastolic CHF (congestive heart failure) (HCC)   Leukocytosis   SBO (small bowel obstruction) (  HCC): CT showed high-grade small bowel obstruction with transition in the left paramedian abdomen adjacent to a hernia mesh repair. Dr. Tonna Boehringer of surgery is consulted  -Admit to med surg bed -NPO   -prn NG tube (not started yet) -Fentanyl prn pain -Prn Zofran prn nausea   -IVF: NS 75 cc/h -INR/PTT/type & screen -Follow-up general surgeons recommendation -hold all oral meds  Essential (primary) hypertension -prn Hydralzaine  Acid reflux: -IV pepcid  Gout: -hold oral allopurinol  Adult hypothyroidism: -switch oral Synthroid to IV, decreased dose from 88 to 37.5 mcg daily  COPD (chronic obstructive pulmonary disease) (HCC): Stable -As needed albuterol  Chronic diastolic CHF (congestive heart failure) (HCC): 2D echo on 07/12/2015 showed EF 55 with grade 1 diastolic dysfunction.  Patient does not have leg edema.  CHF seem to be compensated. -Hold Lasix while patient is on n.p.o. -Check BNP -->92  Leukocytosis: WBC 12.0. No fever. No source of infection identified.  Likely reactive. -Follow-up with CBC    Inpatient status:  # Patient requires inpatient status due to high intensity of service, high risk for further deterioration and high frequency of surveillance required.  I certify that at the point of admission it is my clinical judgment that the patient will require inpatient hospital care spanning beyond 2 midnights from the point of admission.   This patient has multiple chronic comorbidities including hypertension, COPD, GERD, hypothyroidism, gout, MVR, dCHF, lichen  sclerosis.  Now patient has presenting with high-grade small bowel obstruction  The worrisome physical exam findings include central abdominal tenderness  The initial radiographic and laboratory data are worrisome because of high-grade small bowel obstruction by CT scan  Current medical needs: please see my assessment and plan Predictability of an adverse outcome (risk): Patient has multiple comorbidities as listed above. Now presents with high-grade small bowel obstruction. Patient's presentation is highly complicated.  Patient is at high risk of deteriorating.  Will need to be treated in hospital for at least 2 days.           DVT ppx: SCD Code Status: Full code Family Communication:  Yes, patient's daughter by phone  Disposition Plan:  Anticipate discharge back to previous home environment Consults called:  Dr. Tonna Boehringer of surgery Admission status: Med-surg bed as inpt      Date of Service 09/11/2019    Lorretta Harp [facility]   If 7PM-7AM, please contact night-coverage [email or web address] 09/11/2019, 9:10 AM

## 2019-09-11 NOTE — ED Triage Notes (Signed)
Pt comes from home via EMS due to abdominal pain that started after dinner last night.  PT states her BM are normal but harder to pass. PT states she didn't eat anything out of the ordinary. Pt states she is experiencing severe abdominal pain and nausea since eating.

## 2019-09-11 NOTE — Progress Notes (Signed)
   09/11/19 1700  Clinical Encounter Type  Visited With Patient  Visit Type Initial  Referral From Patient  Consult/Referral To Chaplain  Spiritual Encounters  Spiritual Needs Prayer   This was an order req for an AD.  Chaplain was visiting with patient who was laying in bed. Chaplain introduced himself and inquired about patients well being. Patient stated that she was not feeling well. Chaplain stated that he was there to deliver the AD. Patient stated that she wanted the AD however, she was not feeling well enough to talk about it. Chaplain laid the AD underneath her bag. Chaplain asked if there was anything else that the patient needed and she said prayer. Chaplain offered prayer for healing strength and peace. As chaplain was leaving, patient begin to vomit profusely. Chaplain called for nurses assistance.   End of Visit  No Further.

## 2019-09-12 DIAGNOSIS — K56609 Unspecified intestinal obstruction, unspecified as to partial versus complete obstruction: Secondary | ICD-10-CM

## 2019-09-12 LAB — CBC
HCT: 41.1 % (ref 36.0–46.0)
Hemoglobin: 13.3 g/dL (ref 12.0–15.0)
MCH: 31 pg (ref 26.0–34.0)
MCHC: 32.4 g/dL (ref 30.0–36.0)
MCV: 95.8 fL (ref 80.0–100.0)
Platelets: 169 10*3/uL (ref 150–400)
RBC: 4.29 MIL/uL (ref 3.87–5.11)
RDW: 13.8 % (ref 11.5–15.5)
WBC: 8.8 10*3/uL (ref 4.0–10.5)
nRBC: 0 % (ref 0.0–0.2)

## 2019-09-12 LAB — GLUCOSE, CAPILLARY: Glucose-Capillary: 107 mg/dL — ABNORMAL HIGH (ref 70–99)

## 2019-09-12 LAB — BASIC METABOLIC PANEL WITH GFR
Anion gap: 9 (ref 5–15)
BUN: 21 mg/dL (ref 8–23)
CO2: 22 mmol/L (ref 22–32)
Calcium: 8.1 mg/dL — ABNORMAL LOW (ref 8.9–10.3)
Chloride: 111 mmol/L (ref 98–111)
Creatinine, Ser: 0.79 mg/dL (ref 0.44–1.00)
GFR calc Af Amer: >60 mL/min
GFR calc non Af Amer: >60 mL/min
Glucose, Bld: 130 mg/dL — ABNORMAL HIGH (ref 70–99)
Potassium: 3.9 mmol/L (ref 3.5–5.1)
Sodium: 142 mmol/L (ref 135–145)

## 2019-09-12 NOTE — Progress Notes (Signed)
Patient states pain to abdomen since clamping NGT. No nausea or vomiting.  NGT residual checked for 53ml. MD notified and states to continue with NGT clamped and will come evaluate patient.  Patient has had liquid stool x1 since NGT clamped. Patient remains NPO.  Will continue to monitor.

## 2019-09-12 NOTE — Plan of Care (Signed)
  Problem: Clinical Measurements: Goal: Will remain free from infection Outcome: Progressing   Problem: Activity: Goal: Risk for activity intolerance will decrease Outcome: Progressing   Problem: Nutrition: Goal: Adequate nutrition will be maintained Outcome: Progressing   Problem: Coping: Goal: Level of anxiety will decrease Outcome: Progressing   Problem: Pain Managment: Goal: General experience of comfort will improve Outcome: Progressing   

## 2019-09-12 NOTE — Progress Notes (Signed)
NG tube clamped by MD on rounds.  Currently patient denies N/V and pain, Will monitor.

## 2019-09-12 NOTE — Progress Notes (Addendum)
Subjective:  CC: Martha Chung is a 79 y.o. female  Hospital stay day 1,   SBO  HPI:  doing much better today after a positive bowel movement this morning.  ROS:  General: Denies weight loss, weight gain, fatigue, fevers, chills, and night sweats. Heart: Denies chest pain, palpitations, racing heart, irregular heartbeat, leg pain or swelling, and decreased activity tolerance. Respiratory: Denies breathing difficulty, shortness of breath, wheezing, cough, and sputum. GI: Denies change in appetite, heartburn, nausea, vomiting, constipation, diarrhea, and blood in stool. GU: Denies difficulty urinating, pain with urinating, urgency, frequency, blood in urine.   Objective:   Temp:  [97.7 F (36.5 C)-98.4 F (36.9 C)] 97.9 F (36.6 C) (03/13 1200) Pulse Rate:  [69-82] 73 (03/13 1200) Resp:  [16-20] 16 (03/13 1200) BP: (143-170)/(61-75) 143/68 (03/13 1200) SpO2:  [94 %-99 %] 99 % (03/13 1200) Weight:  [100.2 kg] 100.2 kg (03/13 0638)     Height: 5\' 1"  (154.9 cm) Weight: 100.2 kg BMI (Calculated): 41.76   Intake/Output this shift:   Intake/Output Summary (Last 24 hours) at 09/12/2019 1504 Last data filed at 09/12/2019 1400 Gross per 24 hour  Intake 1133.44 ml  Output 650 ml  Net 483.44 ml    Constitutional :  alert, cooperative, appears stated age and no distress  Respiratory:  clear to auscultation bilaterally  Cardiovascular:  regular rate and rhythm  Gastrointestinal: Soft, tenderness to palpation much improved but still present in the suprapubic region..   Skin: Cool and moist.   Psychiatric: Normal affect, non-agitated, not confused       LABS:  CMP Latest Ref Rng & Units 09/12/2019 09/11/2019 09/14/2015  Glucose 70 - 99 mg/dL 09/16/2015) 527(P) -  BUN 8 - 23 mg/dL 21 824(M) 35(T)  Creatinine 0.44 - 1.00 mg/dL 61(W 4.31 5.40  Sodium 135 - 145 mmol/L 142 139 -  Potassium 3.5 - 5.1 mmol/L 3.9 4.5 -  Chloride 98 - 111 mmol/L 111 105 -  CO2 22 - 32 mmol/L 22 26 -  Calcium 8.9  - 10.3 mg/dL 8.1(L) 9.1 -  Total Protein 6.5 - 8.1 g/dL - 6.8 -  Total Bilirubin 0.3 - 1.2 mg/dL - 1.1 -  Alkaline Phos 38 - 126 U/L - 57 -  AST 15 - 41 U/L - 28 -  ALT 0 - 44 U/L - 12 -   CBC Latest Ref Rng & Units 09/12/2019 09/11/2019 09/14/2015  WBC 4.0 - 10.5 K/uL 8.8 12.0(H) 7.6  Hemoglobin 12.0 - 15.0 g/dL 09/16/2015 76.1 95.0  Hematocrit 36.0 - 46.0 % 41.1 44.0 40.3  Platelets 150 - 400 K/uL 169 172 221    RADS: n/a Assessment:   SBO.  Clinically improving with return of bowel function.  Still has some focal tenderness to palpation though so will continue to take it slowly with a clamp trial first to see what her residual is.  ADDENDUM:  Clamp trial with 26ml residual but persistent TTP at transition point.  Will keep NG tube clamped overnight and reassess since initial presentation had concerns for high grade obstruction.  She has had two additional BMs since last exam so that is reassuring

## 2019-09-12 NOTE — Progress Notes (Signed)
PROGRESS NOTE    Martha Chung  UXN:[id number] DOB: 1941/04/09 DOA: 09/11/2019 PCP: Clent Jacks, PA-C   Brief Narrative:  Martha Chung is a 79 y.o. female with medical history significant of hypertension, COPD, GERD, hypothyroidism, gout, MVR, dCHF, lichen sclerosis, who presents with nausea, vomiting, abdominal pain.  CT abdomen consistent with high-grade SBO.  Surgery was consulted and NG tube was placed.  Subjective: Patient was feeling better when seen today.  No nausea or vomiting.  Abdominal pain is improving.  She had 2 bowel movements earlier this morning.  Assessment & Plan:   Principal Problem:   SBO (small bowel obstruction) (HCC) Active Problems:   Essential (primary) hypertension   Acid reflux   Gout   Adult hypothyroidism   COPD (chronic obstructive pulmonary disease) (HCC)   Chronic diastolic CHF (congestive heart failure) (HCC)   Leukocytosis  SBO (small bowel obstruction) (HCC): CT showed high-grade small bowel obstruction with transition in the left paramedian abdomen adjacent to a hernia mesh repair.  Surgery was consulted and they will try conservative management before.  Started improving today, had 2 BMs and good bowel sounds. -NG tube will be clamped today. -Continue to monitor. -Continue IV fluid as patient is n.p.o.  Essential (primary) hypertension -prn Hydralzaine  Acid reflux: -IV pepcid  Gout: -hold oral allopurinol  Adult hypothyroidism: -switch oral Synthroid to IV, decreased dose from 88 to 37.5 mcg daily  COPD (chronic obstructive pulmonary disease) (HCC): Stable -As needed albuterol  Chronic diastolic CHF (congestive heart failure) (HCC): 2D echo on 07/12/2015 showed EF 55 with grade 1 diastolic dysfunction.  Patient does not have leg edema.  CHF seem to be compensated.  BNP 92. -Hold Lasix while patient is on n.p.o.  Objective: Vitals:   09/11/19 2005 09/12/19 0523 09/12/19 0638 09/12/19 1200  BP: (!) 156/69 (!)  148/61  (!) 143/68  Pulse: 82 80  73  Resp: 20 16  16   Temp: 98.4 F (36.9 C) 98 F (36.7 C)  97.9 F (36.6 C)  TempSrc:    Oral  SpO2: 94% 96%  99%  Weight:   100.2 kg   Height:   5\' 1"  (1.549 m)     Intake/Output Summary (Last 24 hours) at 09/12/2019 1522 Last data filed at 09/12/2019 1400 Gross per 24 hour  Intake 1133.44 ml  Output 650 ml  Net 483.44 ml   Filed Weights   09/11/19 0549 09/12/19 0638  Weight: 98 kg 100.2 kg    Examination:  General exam: Appears calm and comfortable.  NG tube is in place. Respiratory system: Clear to auscultation. Respiratory effort normal. Cardiovascular system: S1 & S2 heard, RRR. No JVD, murmurs, rubs, gallops or clicks. Gastrointestinal system: Soft, nontender, nondistended, bowel sounds positive. Central nervous system: Alert and oriented. No focal neurological deficits.Symmetric 5 x 5 power. Extremities: No edema, no cyanosis, pulses intact and symmetrical. Skin: No rashes, lesions or ulcers Psychiatry: Judgement and insight appear normal. Mood & affect appropriate.    DVT prophylaxis: SCDs. Code Status: Full Family Communication: Patient was updated. Disposition Plan: Pending improvement.  Currently n.p.o. with NG tube for SBO.  Consultants:   General surgery  Procedures:  Antimicrobials:   Data Reviewed: I have personally reviewed following labs and imaging studies  CBC: Recent Labs  Lab 09/11/19 0555 09/12/19 0457  WBC 12.0* 8.8  NEUTROABS 9.9*  --   HGB 14.2 13.3  HCT 44.0 41.1  MCV 94.6 95.8  PLT 172 169   Basic Metabolic  Panel: Recent Labs  Lab 09/11/19 0555 09/12/19 0457  NA 139 142  K 4.5 3.9  CL 105 111  CO2 26 22  GLUCOSE 150* 130*  BUN 29* 21  CREATININE 0.90 0.79  CALCIUM 9.1 8.1*   GFR: Estimated Creatinine Clearance: 61.9 mL/min (by C-G formula based on SCr of 0.79 mg/dL). Liver Function Tests: Recent Labs  Lab 09/11/19 0555  AST 28  ALT 12  ALKPHOS 57  BILITOT 1.1  PROT 6.8    ALBUMIN 3.8   Recent Labs  Lab 09/11/19 0555  LIPASE 37   No results for input(s): AMMONIA in the last 168 hours. Coagulation Profile: Recent Labs  Lab 09/11/19 0846  INR 1.0   Cardiac Enzymes: No results for input(s): CKTOTAL, CKMB, CKMBINDEX, TROPONINI in the last 168 hours. BNP (last 3 results) No results for input(s): PROBNP in the last 8760 hours. HbA1C: No results for input(s): HGBA1C in the last 72 hours. CBG: Recent Labs  Lab 09/12/19 0745  GLUCAP 107*   Lipid Profile: No results for input(s): CHOL, HDL, LDLCALC, TRIG, CHOLHDL, LDLDIRECT in the last 72 hours. Thyroid Function Tests: No results for input(s): TSH, T4TOTAL, FREET4, T3FREE, THYROIDAB in the last 72 hours. Anemia Panel: No results for input(s): VITAMINB12, FOLATE, FERRITIN, TIBC, IRON, RETICCTPCT in the last 72 hours. Sepsis Labs: No results for input(s): PROCALCITON, LATICACIDVEN in the last 168 hours.  Recent Results (from the past 240 hour(s))  Respiratory Panel by RT PCR (Flu A&B, Covid) - Nasopharyngeal Swab     Status: None   Collection Time: 09/11/19  7:16 AM   Specimen: Nasopharyngeal Swab  Result Value Ref Range Status   SARS Coronavirus 2 by RT PCR NEGATIVE NEGATIVE Final    Comment: (NOTE) SARS-CoV-2 target nucleic acids are NOT DETECTED. The SARS-CoV-2 RNA is generally detectable in upper respiratoy specimens during the acute phase of infection. The lowest concentration of SARS-CoV-2 viral copies this assay can detect is 131 copies/mL. A negative result does not preclude SARS-Cov-2 infection and should not be used as the sole basis for treatment or other patient management decisions. A negative result may occur with  improper specimen collection/handling, submission of specimen other than nasopharyngeal swab, presence of viral mutation(s) within the areas targeted by this assay, and inadequate number of viral copies (<131 copies/mL). A negative result must be combined with  clinical observations, patient history, and epidemiological information. The expected result is Negative. Fact Sheet for Patients:  PinkCheek.be Fact Sheet for Healthcare Providers:  GravelBags.it This test is not yet ap proved or cleared by the [place] FDA and  has been authorized for detection and/or diagnosis of SARS-CoV-2 by FDA under an Emergency Use Authorization (EUA). This EUA will remain  in effect (meaning this test can be used) for the duration of the COVID-19 declaration under Section 564(b)(1) of the Act, 21 U.S.C. section 360bbb-3(b)(1), unless the authorization is terminated or revoked sooner.    Influenza A by PCR NEGATIVE NEGATIVE Final   Influenza B by PCR NEGATIVE NEGATIVE Final    Comment: (NOTE) The Xpert Xpress SARS-CoV-2/FLU/RSV assay is intended as an aid in  the diagnosis of influenza from Nasopharyngeal swab specimens and  should not be used as a sole basis for treatment. Nasal washings and  aspirates are unacceptable for Xpert Xpress SARS-CoV-2/FLU/RSV  testing. Fact Sheet for Patients: PinkCheek.be Fact Sheet for Healthcare Providers: GravelBags.it This test is not yet approved or cleared by the [place] and  has been authorized for  detection and/or diagnosis of SARS-CoV-2 by  FDA under an Emergency Use Authorization (EUA). This EUA will remain  in effect (meaning this test can be used) for the duration of the  Covid-19 declaration under Section 564(b)(1) of the Act, 21  U.S.C. section 360bbb-3(b)(1), unless the authorization is  terminated or revoked. Performed at [facility], [address], [place], [place] [address]      Radiology Studies: CT ABDOMEN PELVIS WO CONTRAST  Result Date: 09/11/2019 CLINICAL DATA:  Acute nonlocalized abdominal pain. Upper abdominal pain began last night after dinner EXAM: CT  ABDOMEN AND PELVIS WITHOUT CONTRAST TECHNIQUE: Multidetector CT imaging of the abdomen and pelvis was performed following the standard protocol without IV contrast. COMPARISON:  07/03/2015 FINDINGS: Lower chest: Streaky opacity in the lower lungs attributed to scarring. Hepatobiliary: No focal liver abnormality.Cholecystectomy with no bile duct dilatation. Pancreas: Unremarkable. Spleen: Unremarkable. Adrenals/Urinary Tract: Negative adrenals. No hydronephrosis or stone. Unremarkable bladder. Stomach/Bowel: Dilated fluid-filled small bowel with distal fecalization an abrupt transition to decompressed bowel. The transition point is in the low ventral abdomen adjacent to a left-sided hernia mesh. Sigmoidectomy with anastomosis. There is also a small bowel anastomosis that is decompressed. Since prior the right-sided ostomy has been reversed with mesh repair of parastomal hernia. Vascular/Lymphatic: Atherosclerotic calcification. No mass or adenopathy. Reproductive:Hysterectomy Other: No ascites or pneumoperitoneum. Musculoskeletal: Advanced lumbar disc and facet degeneration with remote T12 compression fracture and kyphoscoliosis. IMPRESSION: High-grade small bowel obstruction with transition in the left paramedian abdomen adjacent to a hernia mesh repair. Electronically Signed   By: Marnee Spring M.D.   On: 09/11/2019 06:45    Scheduled Meds: . levothyroxine  37.5 mcg Intravenous Daily   Continuous Infusions: . sodium chloride 75 mL/hr at 09/12/19 0456  . famotidine (PEPCID) IV 20 mg (09/12/19 0954)     LOS: 1 day   Time spent: 40 minutes.  Arnetha Courser, MD [facility]  If 7PM-7AM, please contact night-coverage [email or web address]  09/12/2019, 3:22 PM   This record has been created using Conservation officer, historic buildings. Errors have been sought and corrected,but may not always be located. Such creation errors do not reflect on the standard of care.

## 2019-09-13 LAB — GLUCOSE, CAPILLARY: Glucose-Capillary: 96 mg/dL (ref 70–99)

## 2019-09-13 LAB — CBC
HCT: 39.9 % (ref 36.0–46.0)
Hemoglobin: 12.3 g/dL (ref 12.0–15.0)
MCH: 30.4 pg (ref 26.0–34.0)
MCHC: 30.8 g/dL (ref 30.0–36.0)
MCV: 98.5 fL (ref 80.0–100.0)
Platelets: 135 10*3/uL — ABNORMAL LOW (ref 150–400)
RBC: 4.05 MIL/uL (ref 3.87–5.11)
RDW: 13.5 % (ref 11.5–15.5)
WBC: 6.4 10*3/uL (ref 4.0–10.5)
nRBC: 0 % (ref 0.0–0.2)

## 2019-09-13 LAB — BASIC METABOLIC PANEL
Anion gap: 6 (ref 5–15)
BUN: 15 mg/dL (ref 8–23)
CO2: 24 mmol/L (ref 22–32)
Calcium: 7.9 mg/dL — ABNORMAL LOW (ref 8.9–10.3)
Chloride: 112 mmol/L — ABNORMAL HIGH (ref 98–111)
Creatinine, Ser: 0.85 mg/dL (ref 0.44–1.00)
GFR calc Af Amer: >60 mL/min (ref >60)
GFR calc non Af Amer: >60 mL/min (ref >60)
Glucose, Bld: 110 mg/dL — ABNORMAL HIGH (ref 70–99)
Potassium: 3.9 mmol/L (ref 3.5–5.1)
Sodium: 142 mmol/L (ref 135–145)

## 2019-09-13 LAB — MAGNESIUM: Magnesium: 2 mg/dL (ref 1.7–2.4)

## 2019-09-13 MED ORDER — KETOROLAC TROMETHAMINE 30 MG/ML IJ SOLN
30.0000 mg | Freq: Once | INTRAMUSCULAR | Status: AC
  Administered 2019-09-13: 10:00:00 30 mg via INTRAVENOUS
  Filled 2019-09-13: qty 1

## 2019-09-13 NOTE — Progress Notes (Signed)
PROGRESS NOTE    Martha Chung  HYQ:[id number] DOB: 1941-06-28 DOA: 09/11/2019 PCP: Nelwyn Salisbury, PA-C   Brief Narrative:  Martha Chung is a 79 y.o. female with medical history significant of hypertension, COPD, GERD, hypothyroidism, gout, MVR, dCHF, lichen sclerosis, who presents with nausea, vomiting, abdominal pain.  CT abdomen consistent with high-grade SBO.  Surgery was consulted and NG tube was placed.  Subjective: Was complaining of headache when seen today.  She said she was given some pain medicine earlier with not much help.  Denies any nausea, vomiting or blurry vision.  No focal deficit.  She did had 1 bowel movement this morning.  NG tube was clamped.  Assessment & Plan:   Principal Problem:   SBO (small bowel obstruction) (HCC) Active Problems:   Essential (primary) hypertension   Acid reflux   Gout   Adult hypothyroidism   COPD (chronic obstructive pulmonary disease) (HCC)   Chronic diastolic CHF (congestive heart failure) (HCC)   Leukocytosis  SBO (small bowel obstruction) ([place]): CT showed high-grade small bowel obstruction with transition in the left paramedian abdomen adjacent to a hernia mesh repair.  Surgery was consulted and they will try conservative management before.   -NG tube was clamped. -Continue to monitor. -Continue IV fluid as patient is n.p.o.  Headache.  Patient does not has any history of migraines.  Occasionally get headaches.  No red flag symptoms. -We will try Toradol.  Essential (primary) hypertension -prn Hydralzaine  Acid reflux: -IV pepcid  Gout: -hold oral allopurinol  Adult hypothyroidism: -switch oral Synthroid to IV, decreased dose from 88 to 37.5 mcg daily  COPD (chronic obstructive pulmonary disease) ([place]): Stable -As needed albuterol  Chronic diastolic CHF (congestive heart failure) ([place]): 2D echo on 07/12/2015 showed EF 55 with grade 1 diastolic dysfunction.  Patient does not have leg edema.  CHF seem to be  compensated.  BNP 92. -Hold Lasix while patient is on n.p.o.  Objective: Vitals:   09/12/19 1200 09/12/19 2112 09/13/19 0458 09/13/19 1257  BP: (!) 143/68 (!) 160/78 (!) 143/60 (!) 154/61  Pulse: 73 77 86 70  Resp: 16 20 20    Temp: 97.9 F (36.6 C) 98.3 F (36.8 C) 98.3 F (36.8 C) 98.1 F (36.7 C)  TempSrc: Oral Oral Oral Oral  SpO2: 99% 98% 97% 98%  Weight:      Height:        Intake/Output Summary (Last 24 hours) at 09/13/2019 1305 Last data filed at 09/12/2019 1828 Gross per 24 hour  Intake 1018.47 ml  Output 150 ml  Net 868.47 ml   Filed Weights   09/11/19 0549 09/12/19 0638  Weight: 98 kg 100.2 kg    Examination:  General exam: Appears calm and comfortable.  NG tube is in place. Respiratory system: Clear to auscultation. Respiratory effort normal. Cardiovascular system: S1 & S2 heard, RRR. No JVD, murmurs, rubs, gallops or clicks. Gastrointestinal system: Soft, nontender, nondistended, bowel sounds positive. Central nervous system: Alert and oriented. No focal neurological deficits.Symmetric 5 x 5 power. Extremities: No edema, no cyanosis, pulses intact and symmetrical. Skin: No rashes, lesions or ulcers Psychiatry: Judgement and insight appear normal. Mood & affect appropriate.    DVT prophylaxis: SCDs. Code Status: Full Family Communication: Patient was updated. Disposition Plan: Pending improvement.  Currently n.p.o. with NG tube for SBO.  Consultants:   General surgery  Procedures:  Antimicrobials:   Data Reviewed: I have personally reviewed following labs and imaging studies  CBC: Recent Labs  Lab  09/11/19 0555 09/12/19 0457 09/13/19 0745  WBC 12.0* 8.8 6.4  NEUTROABS 9.9*  --   --   HGB 14.2 13.3 12.3  HCT 44.0 41.1 39.9  MCV 94.6 95.8 98.5  PLT 172 169 135*   Basic Metabolic Panel: Recent Labs  Lab 09/11/19 0555 09/12/19 0457 09/13/19 0745  NA 139 142 142  K 4.5 3.9 3.9  CL 105 111 112*  CO2 26 22 24   GLUCOSE 150* 130* 110*    BUN 29* 21 15  CREATININE 0.90 0.79 0.85  CALCIUM 9.1 8.1* 7.9*  MG  --   --  2.0   GFR: Estimated Creatinine Clearance: 58.3 mL/min (by C-G formula based on SCr of 0.85 mg/dL). Liver Function Tests: Recent Labs  Lab 09/11/19 0555  AST 28  ALT 12  ALKPHOS 57  BILITOT 1.1  PROT 6.8  ALBUMIN 3.8   Recent Labs  Lab 09/11/19 0555  LIPASE 37   No results for input(s): AMMONIA in the last 168 hours. Coagulation Profile: Recent Labs  Lab 09/11/19 0846  INR 1.0   Cardiac Enzymes: No results for input(s): CKTOTAL, CKMB, CKMBINDEX, TROPONINI in the last 168 hours. BNP (last 3 results) No results for input(s): PROBNP in the last 8760 hours. HbA1C: No results for input(s): HGBA1C in the last 72 hours. CBG: Recent Labs  Lab 09/12/19 0745 09/13/19 0758  GLUCAP 107* 96   Lipid Profile: No results for input(s): CHOL, HDL, LDLCALC, TRIG, CHOLHDL, LDLDIRECT in the last 72 hours. Thyroid Function Tests: No results for input(s): TSH, T4TOTAL, FREET4, T3FREE, THYROIDAB in the last 72 hours. Anemia Panel: No results for input(s): VITAMINB12, FOLATE, FERRITIN, TIBC, IRON, RETICCTPCT in the last 72 hours. Sepsis Labs: No results for input(s): PROCALCITON, LATICACIDVEN in the last 168 hours.  Recent Results (from the past 240 hour(s))  Respiratory Panel by RT PCR (Flu A&B, Covid) - Nasopharyngeal Swab     Status: None   Collection Time: 09/11/19  7:16 AM   Specimen: Nasopharyngeal Swab  Result Value Ref Range Status   SARS Coronavirus 2 by RT PCR NEGATIVE NEGATIVE Final    Comment: (NOTE) SARS-CoV-2 target nucleic acids are NOT DETECTED. The SARS-CoV-2 RNA is generally detectable in upper respiratoy specimens during the acute phase of infection. The lowest concentration of SARS-CoV-2 viral copies this assay can detect is 131 copies/mL. A negative result does not preclude SARS-Cov-2 infection and should not be used as the sole basis for treatment or other patient management  decisions. A negative result may occur with  improper specimen collection/handling, submission of specimen other than nasopharyngeal swab, presence of viral mutation(s) within the areas targeted by this assay, and inadequate number of viral copies (<131 copies/mL). A negative result must be combined with clinical observations, patient history, and epidemiological information. The expected result is Negative. Fact Sheet for Patients:  11/11/19 Fact Sheet for Healthcare Providers:  https://www.moore.com/ This test is not yet ap proved or cleared by the https://www.[place].biz/ FDA and  has been authorized for detection and/or diagnosis of SARS-CoV-2 by FDA under an Emergency Use Authorization (EUA). This EUA will remain  in effect (meaning this test can be used) for the duration of the COVID-19 declaration under Section 564(b)(1) of the Act, 21 U.S.C. section 360bbb-3(b)(1), unless the authorization is terminated or revoked sooner.    Influenza A by PCR NEGATIVE NEGATIVE Final   Influenza B by PCR NEGATIVE NEGATIVE Final    Comment: (NOTE) The Xpert Xpress SARS-CoV-2/FLU/RSV assay is intended as an  aid in  the diagnosis of influenza from Nasopharyngeal swab specimens and  should not be used as a sole basis for treatment. Nasal washings and  aspirates are unacceptable for Xpert Xpress SARS-CoV-2/FLU/RSV  testing. Fact Sheet for Patients: https://www.moore.com/ Fact Sheet for Healthcare Providers: https://www.[place].biz/ This test is not yet approved or cleared by the [place] FDA and  has been authorized for detection and/or diagnosis of SARS-CoV-2 by  FDA under an Emergency Use Authorization (EUA). This EUA will remain  in effect (meaning this test can be used) for the duration of the  Covid-19 declaration under Section 564(b)(1) of the Act, 21  U.S.C. section 360bbb-3(b)(1), unless the authorization  is  terminated or revoked. Performed at [facility], [address], [place], [place] [address]      Radiology Studies: No results found.  Scheduled Meds: . levothyroxine  37.5 mcg Intravenous Daily   Continuous Infusions: . famotidine (PEPCID) IV 20 mg (09/13/19 0953)     LOS: 2 days   Time spent: 35 minutes.  Arnetha Courser, MD [facility]  If 7PM-7AM, please contact night-coverage [email or web address]  09/13/2019, 1:05 PM   This record has been created using Conservation officer, historic buildings. Errors have been sought and corrected,but may not always be located. Such creation errors do not reflect on the standard of care.

## 2019-09-13 NOTE — Progress Notes (Signed)
Subjective:  CC: Martha Chung is a 79 y.o. female  Hospital stay day 2,   SBO  HPI: Patient continues to do well today with additional bowel movements.  However, She still does have some focal tenderness present.  ROS:  General: Denies weight loss, weight gain, fatigue, fevers, chills, and night sweats. Heart: Denies chest pain, palpitations, racing heart, irregular heartbeat, leg pain or swelling, and decreased activity tolerance. Respiratory: Denies breathing difficulty, shortness of breath, wheezing, cough, and sputum. GI: Denies change in appetite, heartburn, nausea, vomiting, constipation, diarrhea, and blood in stool. GU: Denies difficulty urinating, pain with urinating, urgency, frequency, blood in urine.   Objective:   Temp:  [98.1 F (36.7 C)-98.3 F (36.8 C)] 98.1 F (36.7 C) (03/14 1257) Pulse Rate:  [70-86] 70 (03/14 1257) Resp:  [20] 20 (03/14 0458) BP: (143-160)/(60-78) 154/61 (03/14 1257) SpO2:  [97 %-98 %] 98 % (03/14 1257)     Height: 5\' 1"  (154.9 cm) Weight: 100.2 kg BMI (Calculated): 41.76   Intake/Output this shift:   Intake/Output Summary (Last 24 hours) at 09/13/2019 1419 Last data filed at 09/13/2019 1330 Gross per 24 hour  Intake 1378.47 ml  Output --  Net 1378.47 ml    Constitutional :  alert, cooperative, appears stated age and no distress  Respiratory:  clear to auscultation bilaterally  Cardiovascular:  regular rate and rhythm  Gastrointestinal: Soft, tenderness to palpation much improved but still present in the suprapubic region..   Skin: Cool and moist.   Psychiatric: Normal affect, non-agitated, not confused       LABS:  CMP Latest Ref Rng & Units 09/13/2019 09/12/2019 09/11/2019  Glucose 70 - 99 mg/dL 11/11/2019) 993(T) 701(X)  BUN 8 - 23 mg/dL 15 21 793(J)  Creatinine 0.44 - 1.00 mg/dL 03(E 0.92 3.30  Sodium 135 - 145 mmol/L 142 142 139  Potassium 3.5 - 5.1 mmol/L 3.9 3.9 4.5  Chloride 98 - 111 mmol/L 112(H) 111 105  CO2 22 - 32 mmol/L  24 22 26   Calcium 8.9 - 10.3 mg/dL 7.9(L) 8.1(L) 9.1  Total Protein 6.5 - 8.1 g/dL - - 6.8  Total Bilirubin 0.3 - 1.2 mg/dL - - 1.1  Alkaline Phos 38 - 126 U/L - - 57  AST 15 - 41 U/L - - 28  ALT 0 - 44 U/L - - 12   CBC Latest Ref Rng & Units 09/13/2019 09/12/2019 09/11/2019  WBC 4.0 - 10.5 K/uL 6.4 8.8 12.0(H)  Hemoglobin 12.0 - 15.0 g/dL 09/14/2019 11/11/2019 22.6  Hematocrit 36.0 - 46.0 % 39.9 41.1 44.0  Platelets 150 - 400 K/uL 135(L) 169 172    RADS: n/a Assessment:   SBO.  Residual essentially 0 mL this morning from the NG tube.  NG tube was pulled and a clear liquid diet will be started to see if she continues to tolerate well.

## 2019-09-14 LAB — BASIC METABOLIC PANEL
Anion gap: 7 (ref 5–15)
BUN: 11 mg/dL (ref 8–23)
CO2: 23 mmol/L (ref 22–32)
Calcium: 7.8 mg/dL — ABNORMAL LOW (ref 8.9–10.3)
Chloride: 110 mmol/L (ref 98–111)
Creatinine, Ser: 0.68 mg/dL (ref 0.44–1.00)
GFR calc Af Amer: >60 mL/min (ref >60)
GFR calc non Af Amer: >60 mL/min (ref >60)
Glucose, Bld: 107 mg/dL — ABNORMAL HIGH (ref 70–99)
Potassium: 3.2 mmol/L — ABNORMAL LOW (ref 3.5–5.1)
Sodium: 140 mmol/L (ref 135–145)

## 2019-09-14 LAB — GLUCOSE, CAPILLARY: Glucose-Capillary: 107 mg/dL — ABNORMAL HIGH (ref 70–99)

## 2019-09-14 LAB — MAGNESIUM: Magnesium: 1.9 mg/dL (ref 1.7–2.4)

## 2019-09-14 MED ORDER — POTASSIUM CHLORIDE CRYS ER 20 MEQ PO TBCR
40.0000 meq | EXTENDED_RELEASE_TABLET | Freq: Once | ORAL | Status: AC
  Administered 2019-09-14: 40 meq via ORAL
  Filled 2019-09-14: qty 2

## 2019-09-14 MED ORDER — LEVOTHYROXINE SODIUM 88 MCG PO TABS
88.0000 ug | ORAL_TABLET | Freq: Every day | ORAL | Status: DC
  Filled 2019-09-14: qty 1

## 2019-09-14 MED ORDER — SODIUM CHLORIDE 0.9 % IV SOLN
INTRAVENOUS | Status: DC | PRN
  Administered 2019-09-14: 10:00:00 30 mL via INTRAVENOUS

## 2019-09-14 NOTE — Care Management Important Message (Signed)
Important Message  Patient Details  Name: Martha Chung MRN: [id number] Date of Birth: 24-Mar-1941   Medicare Important Message Given:  Yes     Johnell Comings 09/14/2019, 12:24 PM

## 2019-09-14 NOTE — Discharge Summary (Signed)
Physician Discharge Summary  Martha Chung VVO:[id number] DOB: 08-Oct-1940 DOA: 09/11/2019  PCP: Clent Jacks, PA-C  Admit date: 09/11/2019 Discharge date: 09/14/2019  Admitted From: Home Disposition: Home  Recommendations for Outpatient Follow-up:  1. Follow up with PCP in 1-2 weeks 2. Please obtain BMP/CBC in one week 3. Please follow up on the following pending results: None  Home Health: No Equipment/Devices: None Discharge Condition: Stable CODE STATUS: Full Diet recommendation: Heart Healthy   Brief/Interim Summary: Martha Eltzroth Youngis a 79 y.o.femalewith medical history significant ofhypertension, COPD, GERD, hypothyroidism, gout,MVR, dCHF, lichen sclerosis,who presents with nausea, vomiting, abdominal pain.  CT abdomen consistent with high-grade SBO.  Surgery was consulted and NG tube was placed.  Her symptoms improved with conservative management she started having bowel movements.  Nausea and vomiting resolved.  NG tube was removed and she was started on clear liquids.  She tolerated well diet advancement in her diet.  She was able to tolerate soft diet on discharge. Her home dose of Synthroid was converted to IV dose while she was n.p.o.  It was resumed once she started taking p.o.  She will continue with rest of her home meds and will follow up with her PCP.  Discharge Diagnoses:  Principal Problem:   SBO (small bowel obstruction) (HCC) Active Problems:   Essential (primary) hypertension   Acid reflux   Gout   Adult hypothyroidism   COPD (chronic obstructive pulmonary disease) (HCC)   Chronic diastolic CHF (congestive heart failure) (HCC)   Leukocytosis  Discharge Instructions  Discharge Instructions    Diet - low sodium heart healthy   Complete by: As directed    Discharge instructions   Complete by: As directed    It was pleasure taking care of you. Please keep yourself well-hydrated and advance your diet slowly. Please follow-up with your primary  care physician.   Increase activity slowly   Complete by: As directed      Allergies as of 09/14/2019      Reactions   Ivp Dye [iodinated Diagnostic Agents] Anaphylaxis   Topical betadine is ok.   Morphine And Related Anaphylaxis   Respiratory Arrest   Shellfish Allergy Anaphylaxis   Dilaudid [hydromorphone Hcl]    BP bottomed out   Macrodantin [nitrofurantoin] Nausea And Vomiting   And diarrhea   Nubain [nalbuphine Hcl] Other (See Comments)   BP "bottoms out"      Medication List    TAKE these medications   allopurinol 100 MG tablet Commonly known as: ZYLOPRIM Take 100 mg by mouth daily. AM   CALCIUM 600 + D PO Take 600 mg elemental calcium/kg/hr by mouth daily.   clobetasol cream 0.05 % Commonly known as: TEMOVATE Apply 1 application topically 2 (two) times daily.   diphenoxylate-atropine 2.5-0.025 MG tablet Commonly known as: Lomotil Take 1 tablet by mouth 4 (four) times daily as needed for diarrhea or loose stools.   famotidine 20 MG tablet Commonly known as: PEPCID Take 20 mg by mouth 2 (two) times daily.   furosemide 20 MG tablet Commonly known as: LASIX Take 20 mg by mouth every morning. AM   levothyroxine 88 MCG tablet Commonly known as: SYNTHROID Take 88 mcg by mouth daily before breakfast.   meloxicam 7.5 MG tablet Commonly known as: MOBIC Take 1 tablet by mouth 2 (two) times daily.   metoprolol tartrate 50 MG tablet Commonly known as: LOPRESSOR Take 50 mg by mouth 2 (two) times daily.   multivitamin tablet Take 1 tablet by mouth  daily.       Allergies  Allergen Reactions  . Ivp Dye [Iodinated Diagnostic Agents] Anaphylaxis    Topical betadine is ok.  . Morphine And Related Anaphylaxis    Respiratory Arrest  . Shellfish Allergy Anaphylaxis  . Dilaudid [Hydromorphone Hcl]     BP bottomed out  . Macrodantin [Nitrofurantoin] Nausea And Vomiting    And diarrhea  . Nubain [Nalbuphine Hcl] Other (See Comments)    BP "bottoms out"     Consultations:  Surgery  Procedures/Studies: CT ABDOMEN PELVIS WO CONTRAST  Result Date: 09/11/2019 CLINICAL DATA:  Acute nonlocalized abdominal pain. Upper abdominal pain began last night after dinner EXAM: CT ABDOMEN AND PELVIS WITHOUT CONTRAST TECHNIQUE: Multidetector CT imaging of the abdomen and pelvis was performed following the standard protocol without IV contrast. COMPARISON:  07/03/2015 FINDINGS: Lower chest: Streaky opacity in the lower lungs attributed to scarring. Hepatobiliary: No focal liver abnormality.Cholecystectomy with no bile duct dilatation. Pancreas: Unremarkable. Spleen: Unremarkable. Adrenals/Urinary Tract: Negative adrenals. No hydronephrosis or stone. Unremarkable bladder. Stomach/Bowel: Dilated fluid-filled small bowel with distal fecalization an abrupt transition to decompressed bowel. The transition point is in the low ventral abdomen adjacent to a left-sided hernia mesh. Sigmoidectomy with anastomosis. There is also a small bowel anastomosis that is decompressed. Since prior the right-sided ostomy has been reversed with mesh repair of parastomal hernia. Vascular/Lymphatic: Atherosclerotic calcification. No mass or adenopathy. Reproductive:Hysterectomy Other: No ascites or pneumoperitoneum. Musculoskeletal: Advanced lumbar disc and facet degeneration with remote T12 compression fracture and kyphoscoliosis. IMPRESSION: High-grade small bowel obstruction with transition in the left paramedian abdomen adjacent to a hernia mesh repair. Electronically Signed   By: Marnee Spring M.D.   On: 09/11/2019 06:45    Subjective: Patient was feeling better when seen today.  She was able to tolerate full liquid at that time.  She was willing to advance diet and go home.  No more nausea or vomiting.  Continues to have mild abdominal discomfort.  Had 2 bowel movements since morning.  Discharge Exam: Vitals:   09/14/19 0546 09/14/19 1213  BP: (!) 136/59 (!) 163/67  Pulse: 69 65   Resp: 18   Temp: 98.1 F (36.7 C) 98.7 F (37.1 C)  SpO2: 97% 99%   Vitals:   09/13/19 1529 09/13/19 2109 09/14/19 0546 09/14/19 1213  BP: (!) 170/71 (!) 154/56 (!) 136/59 (!) 163/67  Pulse: 70 70 69 65  Resp:  18 18   Temp: 98.1 F (36.7 C) 97.8 F (36.6 C) 98.1 F (36.7 C) 98.7 F (37.1 C)  TempSrc: Oral Oral Oral Oral  SpO2: 99% 100% 97% 99%  Weight:      Height:        General: Pt is alert, awake, not in acute distress Cardiovascular: RRR, S1/S2 +, no rubs, no gallops Respiratory: CTA bilaterally, no wheezing, no rhonchi Abdominal: Soft, NT, ND, bowel sounds + Extremities: no edema, no cyanosis   The results of significant diagnostics from this hospitalization (including imaging, microbiology, ancillary and laboratory) are listed below for reference.    Microbiology: Recent Results (from the past 240 hour(s))  Respiratory Panel by RT PCR (Flu A&B, Covid) - Nasopharyngeal Swab     Status: None   Collection Time: 09/11/19  7:16 AM   Specimen: Nasopharyngeal Swab  Result Value Ref Range Status   SARS Coronavirus 2 by RT PCR NEGATIVE NEGATIVE Final    Comment: (NOTE) SARS-CoV-2 target nucleic acids are NOT DETECTED. The SARS-CoV-2 RNA is generally detectable in upper respiratoy  specimens during the acute phase of infection. The lowest concentration of SARS-CoV-2 viral copies this assay can detect is 131 copies/mL. A negative result does not preclude SARS-Cov-2 infection and should not be used as the sole basis for treatment or other patient management decisions. A negative result may occur with  improper specimen collection/handling, submission of specimen other than nasopharyngeal swab, presence of viral mutation(s) within the areas targeted by this assay, and inadequate number of viral copies (<131 copies/mL). A negative result must be combined with clinical observations, patient history, and epidemiological information. The expected result is Negative. Fact  Sheet for Patients:  https://www.moore.com/ Fact Sheet for Healthcare Providers:  https://www.Kotula.biz/ This test is not yet ap proved or cleared by the [place] FDA and  has been authorized for detection and/or diagnosis of SARS-CoV-2 by FDA under an Emergency Use Authorization (EUA). This EUA will remain  in effect (meaning this test can be used) for the duration of the COVID-19 declaration under Section 564(b)(1) of the Act, 21 U.S.C. section 360bbb-3(b)(1), unless the authorization is terminated or revoked sooner.    Influenza A by PCR NEGATIVE NEGATIVE Final   Influenza B by PCR NEGATIVE NEGATIVE Final    Comment: (NOTE) The Xpert Xpress SARS-CoV-2/FLU/RSV assay is intended as an aid in  the diagnosis of influenza from Nasopharyngeal swab specimens and  should not be used as a sole basis for treatment. Nasal washings and  aspirates are unacceptable for Xpert Xpress SARS-CoV-2/FLU/RSV  testing. Fact Sheet for Patients: https://www.moore.com/ Fact Sheet for Healthcare Providers: https://www.Porta.biz/ This test is not yet approved or cleared by the [place] FDA and  has been authorized for detection and/or diagnosis of SARS-CoV-2 by  FDA under an Emergency Use Authorization (EUA). This EUA will remain  in effect (meaning this test can be used) for the duration of the  Covid-19 declaration under Section 564(b)(1) of the Act, 21  U.S.C. section 360bbb-3(b)(1), unless the authorization is  terminated or revoked. Performed at [facility], [address] [place], [place] [address]      Labs: BNP (last 3 results) Recent Labs    09/11/19 0734  BNP 92.0   Basic Metabolic Panel: Recent Labs  Lab 09/11/19 0555 09/12/19 0457 09/13/19 0745 09/14/19 0527  NA 139 142 142 140  K 4.5 3.9 3.9 3.2*  CL 105 111 112* 110  CO2 26 22 24 23   GLUCOSE 150* 130* 110* 107*  BUN 29* 21 15  11   CREATININE 0.90 0.79 0.85 0.68  CALCIUM 9.1 8.1* 7.9* 7.8*  MG  --   --  2.0 1.9   Liver Function Tests: Recent Labs  Lab 09/11/19 0555  AST 28  ALT 12  ALKPHOS 57  BILITOT 1.1  PROT 6.8  ALBUMIN 3.8   Recent Labs  Lab 09/11/19 0555  LIPASE 37   No results for input(s): AMMONIA in the last 168 hours. CBC: Recent Labs  Lab 09/11/19 0555 09/12/19 0457 09/13/19 0745  WBC 12.0* 8.8 6.4  NEUTROABS 9.9*  --   --   HGB 14.2 13.3 12.3  HCT 44.0 41.1 39.9  MCV 94.6 95.8 98.5  PLT 172 169 135*   Cardiac Enzymes: No results for input(s): CKTOTAL, CKMB, CKMBINDEX, TROPONINI in the last 168 hours. BNP: Invalid input(s): POCBNP CBG: Recent Labs  Lab 09/12/19 0745 09/13/19 0758 09/14/19 0812  GLUCAP 107* 96 107*   D-Dimer No results for input(s): DDIMER in the last 72 hours. Hgb A1c No results for input(s): HGBA1C  in the last 72 hours. Lipid Profile No results for input(s): CHOL, HDL, LDLCALC, TRIG, CHOLHDL, LDLDIRECT in the last 72 hours. Thyroid function studies No results for input(s): TSH, T4TOTAL, T3FREE, THYROIDAB in the last 72 hours.  Invalid input(s): FREET3 Anemia work up No results for input(s): VITAMINB12, FOLATE, FERRITIN, TIBC, IRON, RETICCTPCT in the last 72 hours. Urinalysis    Component Value Date/Time   COLORURINE YELLOW (A) 07/03/2015 1405   APPEARANCEUR CLEAR (A) 07/03/2015 1405   APPEARANCEUR Hazy 06/25/2014 1223   LABSPEC 1.020 07/03/2015 1405   LABSPEC 1.002 06/25/2014 1223   PHURINE 5.0 07/03/2015 1405   GLUCOSEU NEGATIVE 07/03/2015 1405   GLUCOSEU Negative 06/25/2014 1223   HGBUR 1+ (A) 07/03/2015 1405   BILIRUBINUR NEGATIVE 07/03/2015 1405   BILIRUBINUR Negative 06/25/2014 1223   KETONESUR NEGATIVE 07/03/2015 1405   PROTEINUR NEGATIVE 07/03/2015 1405   NITRITE POSITIVE (A) 07/03/2015 1405   LEUKOCYTESUR 1+ (A) 07/03/2015 1405   LEUKOCYTESUR 3+ 06/25/2014 1223   Sepsis Labs Invalid input(s): PROCALCITONIN,  WBC,   LACTICIDVEN Microbiology Recent Results (from the past 240 hour(s))  Respiratory Panel by RT PCR (Flu A&B, Covid) - Nasopharyngeal Swab     Status: None   Collection Time: 09/11/19  7:16 AM   Specimen: Nasopharyngeal Swab  Result Value Ref Range Status   SARS Coronavirus 2 by RT PCR NEGATIVE NEGATIVE Final    Comment: (NOTE) SARS-CoV-2 target nucleic acids are NOT DETECTED. The SARS-CoV-2 RNA is generally detectable in upper respiratoy specimens during the acute phase of infection. The lowest concentration of SARS-CoV-2 viral copies this assay can detect is 131 copies/mL. A negative result does not preclude SARS-Cov-2 infection and should not be used as the sole basis for treatment or other patient management decisions. A negative result may occur with  improper specimen collection/handling, submission of specimen other than nasopharyngeal swab, presence of viral mutation(s) within the areas targeted by this assay, and inadequate number of viral copies (<131 copies/mL). A negative result must be combined with clinical observations, patient history, and epidemiological information. The expected result is Negative. Fact Sheet for Patients:  https://www.moore.com/ Fact Sheet for Healthcare Providers:  https://www.[place]orsone.biz/ This test is not yet ap proved or cleared by the [place] FDA and  has been authorized for detection and/or diagnosis of SARS-CoV-2 by FDA under an Emergency Use Authorization (EUA). This EUA will remain  in effect (meaning this test can be used) for the duration of the COVID-19 declaration under Section 564(b)(1) of the Act, 21 U.S.C. section 360bbb-3(b)(1), unless the authorization is terminated or revoked sooner.    Influenza A by PCR NEGATIVE NEGATIVE Final   Influenza B by PCR NEGATIVE NEGATIVE Final    Comment: (NOTE) The Xpert Xpress SARS-CoV-2/FLU/RSV assay is intended as an aid in  the diagnosis of influenza  from Nasopharyngeal swab specimens and  should not be used as a sole basis for treatment. Nasal washings and  aspirates are unacceptable for Xpert Xpress SARS-CoV-2/FLU/RSV  testing. Fact Sheet for Patients: https://www.moore.com/ Fact Sheet for Healthcare Providers: https://www.Ensz.biz/ This test is not yet approved or cleared by the [place] FDA and  has been authorized for detection and/or diagnosis of SARS-CoV-2 by  FDA under an Emergency Use Authorization (EUA). This EUA will remain  in effect (meaning this test can be used) for the duration of the  Covid-19 declaration under Section 564(b)(1) of the Act, 21  U.S.C. section 360bbb-3(b)(1), unless the authorization is  terminated or revoked. Performed at [facility],  [place], [place] [address]     Time coordinating discharge: Over 30 minutes  SIGNED:  Lorella Nimrod, MD  [facility] 09/14/2019, 2:04 PM  If 7PM-7AM, please contact night-coverage [email or web address]  This record has been created using Systems analyst. Errors have been sought and corrected,but may not always be located. Such creation errors do not reflect on the standard of care.

## 2019-09-14 NOTE — Progress Notes (Signed)
Randa Evens Emmick to be D/C'd Home with daughter per MD order.  Discussed prescriptions and follow up appointments with the patient. Prescriptions given to patient, medication list explained in detail. Pt verbalized understanding.  Allergies as of 09/14/2019       Reactions   Ivp Dye [iodinated Diagnostic Agents] Anaphylaxis   Topical betadine is ok.   Morphine And Related Anaphylaxis   Respiratory Arrest   Shellfish Allergy Anaphylaxis   Dilaudid [hydromorphone Hcl]    BP bottomed out   Macrodantin [nitrofurantoin] Nausea And Vomiting   And diarrhea   Nubain [nalbuphine Hcl] Other (See Comments)   BP "bottoms out"        Medication List     TAKE these medications    allopurinol 100 MG tablet Commonly known as: ZYLOPRIM Take 100 mg by mouth daily. AM   CALCIUM 600 + D PO Take 600 mg elemental calcium/kg/hr by mouth daily.   clobetasol cream 0.05 % Commonly known as: TEMOVATE Apply 1 application topically 2 (two) times daily.   diphenoxylate-atropine 2.5-0.025 MG tablet Commonly known as: Lomotil Take 1 tablet by mouth 4 (four) times daily as needed for diarrhea or loose stools.   famotidine 20 MG tablet Commonly known as: PEPCID Take 20 mg by mouth 2 (two) times daily.   furosemide 20 MG tablet Commonly known as: LASIX Take 20 mg by mouth every morning. AM   levothyroxine 88 MCG tablet Commonly known as: SYNTHROID Take 88 mcg by mouth daily before breakfast.   meloxicam 7.5 MG tablet Commonly known as: MOBIC Take 1 tablet by mouth 2 (two) times daily.   metoprolol tartrate 50 MG tablet Commonly known as: LOPRESSOR Take 50 mg by mouth 2 (two) times daily.   multivitamin tablet Take 1 tablet by mouth daily.        Vitals:   09/14/19 0546 09/14/19 1213  BP: (!) 136/59 (!) 163/67  Pulse: 69 65  Resp: 18   Temp: 98.1 F (36.7 C) 98.7 F (37.1 C)  SpO2: 97% 99%    Skin clean, dry and intact without evidence of skin break down, no evidence of skin  tears noted. IV catheter discontinued intact. Site without signs and symptoms of complications. Dressing and pressure applied. Pt denies pain at this time. No complaints noted.  An After Visit Summary was printed and given to the patient. Patient escorted via WC, and D/C home via private auto.  Samadhi Mahurin A Tichina Koebel

## 2019-09-15 ENCOUNTER — Ambulatory Visit: Payer: Medicare HMO | Attending: Internal Medicine

## 2019-09-15 DIAGNOSIS — Z23 Encounter for immunization: Secondary | ICD-10-CM

## 2019-09-15 NOTE — Progress Notes (Signed)
   Covid-19 Vaccination Clinic  Name:  EDDYE BROXTERMAN    MRN: [id number] DOB: 1941/05/25  09/15/2019  Ms. Drennen was observed post Covid-19 immunization for 15 minutes without incident. She was provided with Vaccine Information Sheet and instruction to access the V-Safe system.   Ms. Mccleese was instructed to call 911 with any severe reactions post vaccine: Marland Kitchen Difficulty breathing  . Swelling of face and throat  . A fast heartbeat  . A bad rash all over body  . Dizziness and weakness   Immunizations Administered    Name Date Dose VIS Date Route   Pfizer COVID-19 Vaccine 09/15/2019  1:12 PM 0.3 mL 06/12/2019 Intramuscular   Manufacturer: [employer], [employer]   Lot: [id number]   NDC: [address]-6

## 2019-09-23 DIAGNOSIS — R200 Anesthesia of skin: Secondary | ICD-10-CM | POA: Insufficient documentation

## 2019-09-29 DIAGNOSIS — B351 Tinea unguium: Secondary | ICD-10-CM | POA: Insufficient documentation

## 2019-09-29 DIAGNOSIS — M24576 Contracture, unspecified foot: Secondary | ICD-10-CM | POA: Insufficient documentation

## 2019-09-29 DIAGNOSIS — M19071 Primary osteoarthritis, right ankle and foot: Secondary | ICD-10-CM | POA: Insufficient documentation

## 2019-09-29 DIAGNOSIS — M2060 Acquired deformities of toe(s), unspecified, unspecified foot: Secondary | ICD-10-CM | POA: Insufficient documentation

## 2019-09-29 DIAGNOSIS — L602 Onychogryphosis: Secondary | ICD-10-CM | POA: Insufficient documentation

## 2020-01-21 DIAGNOSIS — R42 Dizziness and giddiness: Secondary | ICD-10-CM | POA: Insufficient documentation

## 2020-01-25 ENCOUNTER — Other Ambulatory Visit: Payer: Self-pay | Admitting: Acute Care

## 2020-01-25 DIAGNOSIS — R42 Dizziness and giddiness: Secondary | ICD-10-CM

## 2020-02-10 ENCOUNTER — Other Ambulatory Visit: Payer: Self-pay

## 2020-02-10 ENCOUNTER — Ambulatory Visit
Admission: RE | Admit: 2020-02-10 | Discharge: 2020-02-10 | Disposition: A | Discharge status: Home / Self Care | Payer: Medicare HMO | Source: Ambulatory Visit | Attending: Acute Care | Admitting: Acute Care

## 2020-02-10 DIAGNOSIS — R42 Dizziness and giddiness: Secondary | ICD-10-CM | POA: Insufficient documentation

## 2020-03-30 DIAGNOSIS — R9439 Abnormal result of other cardiovascular function study: Secondary | ICD-10-CM | POA: Insufficient documentation

## 2020-03-30 DIAGNOSIS — R0609 Other forms of dyspnea: Secondary | ICD-10-CM | POA: Insufficient documentation

## 2020-03-30 DIAGNOSIS — R5383 Other fatigue: Secondary | ICD-10-CM | POA: Insufficient documentation

## 2020-05-09 ENCOUNTER — Other Ambulatory Visit: Payer: Self-pay | Admitting: Gastroenterology

## 2020-05-09 DIAGNOSIS — K8689 Other specified diseases of pancreas: Secondary | ICD-10-CM

## 2020-05-21 ENCOUNTER — Ambulatory Visit: Admission: RE | Admit: 2020-05-21 | Payer: Medicare HMO | Source: Ambulatory Visit

## 2020-06-02 ENCOUNTER — Ambulatory Visit: Payer: Medicare HMO

## 2020-06-07 ENCOUNTER — Other Ambulatory Visit: Payer: Self-pay

## 2020-06-07 ENCOUNTER — Ambulatory Visit
Admission: RE | Admit: 2020-06-07 | Discharge: 2020-06-07 | Disposition: A | Discharge status: Home / Self Care | Payer: Medicare HMO | Source: Ambulatory Visit | Attending: Gastroenterology | Admitting: Gastroenterology

## 2020-06-07 ENCOUNTER — Ambulatory Visit: Payer: Medicare HMO

## 2020-06-07 DIAGNOSIS — K8689 Other specified diseases of pancreas: Secondary | ICD-10-CM | POA: Diagnosis not present

## 2020-06-07 MED ORDER — GADOBUTROL 1 MMOL/ML IV SOLN
10.0000 mL | Freq: Once | INTRAVENOUS | Status: AC | PRN
  Administered 2020-06-07: 10 mL via INTRAVENOUS

## 2020-09-05 IMAGING — CR RIGHT KNEE - COMPLETE 4+ VIEW
1 series · 4 of 4 positions shown · non-contrast
Comparison: None.

CLINICAL DATA: Slipped and fell today. Injured right knee. History
of prior total knee arthroplasty.

EXAM:
RIGHT KNEE - COMPLETE 4+ VIEW

[Series 1: x knee ap right · 0.14mm/px · 4 of 4 slices shown]
[im 1/4]
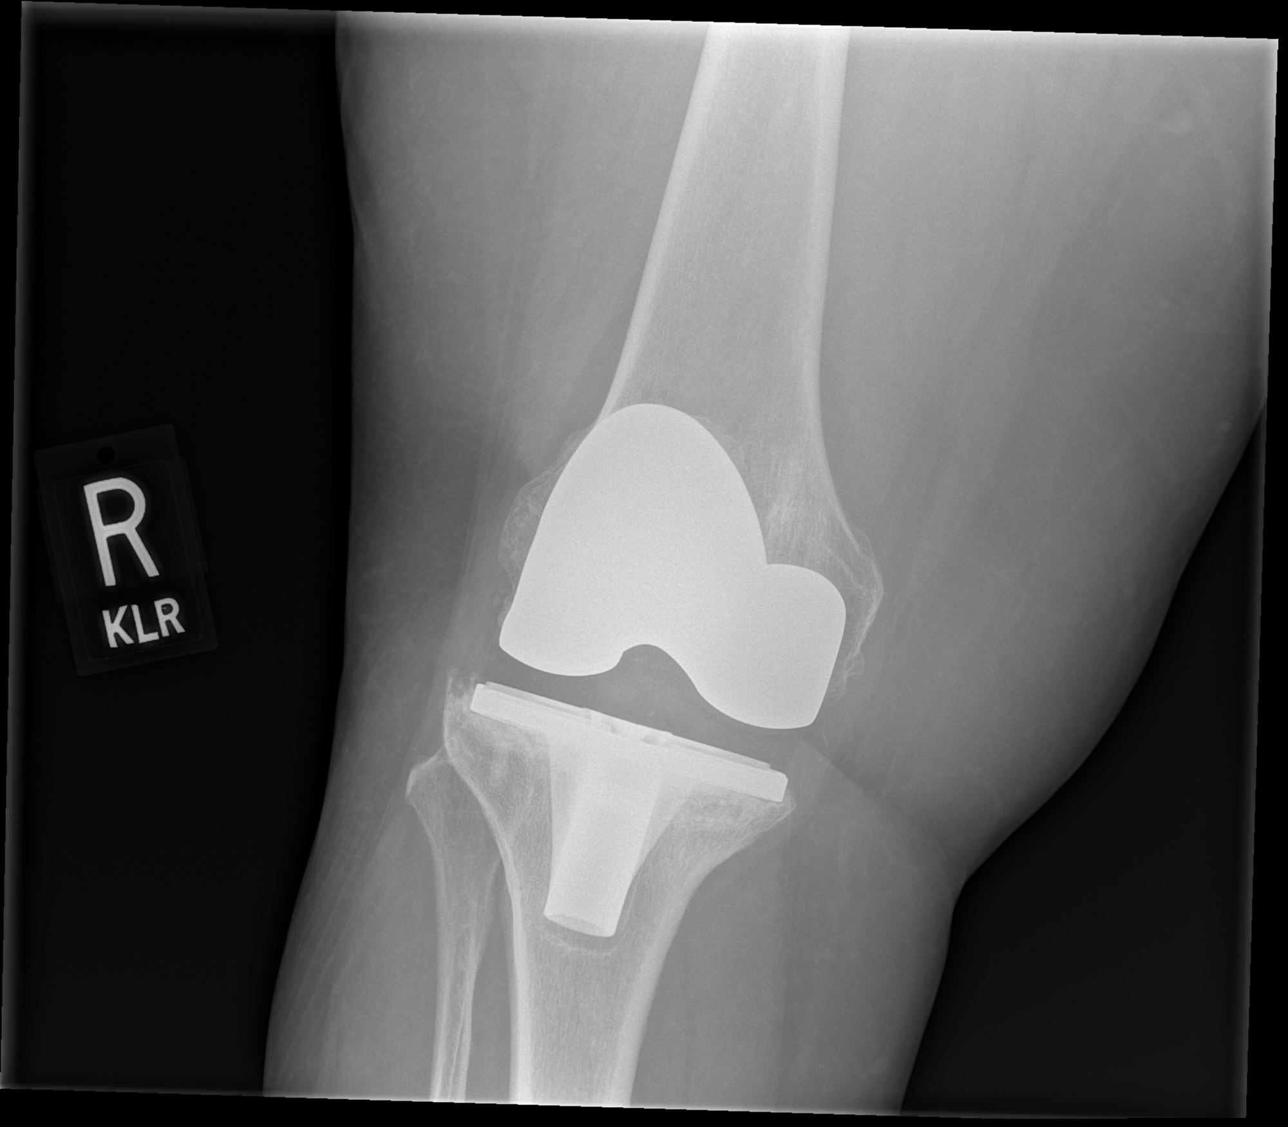
[im 2/4]
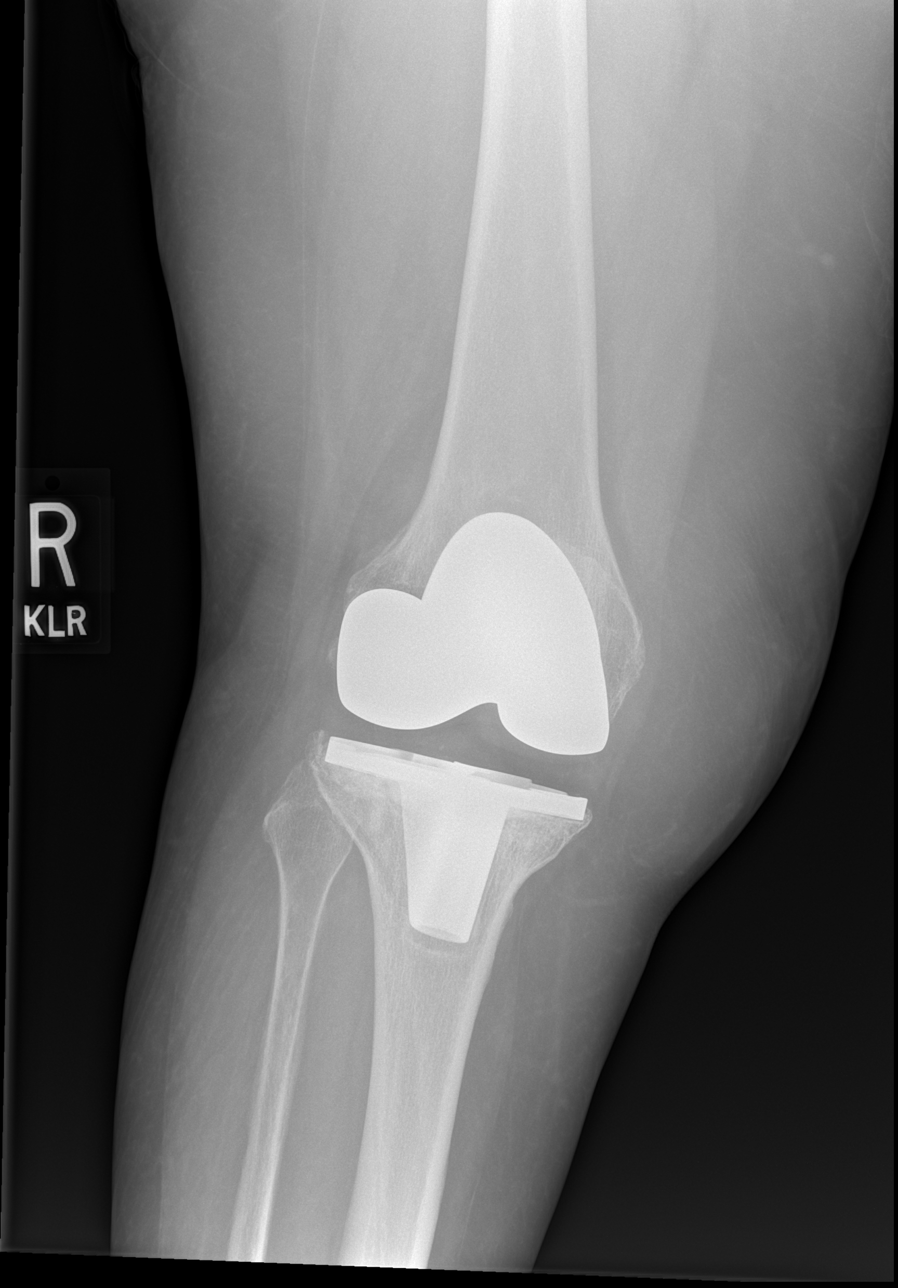
[im 3/4]
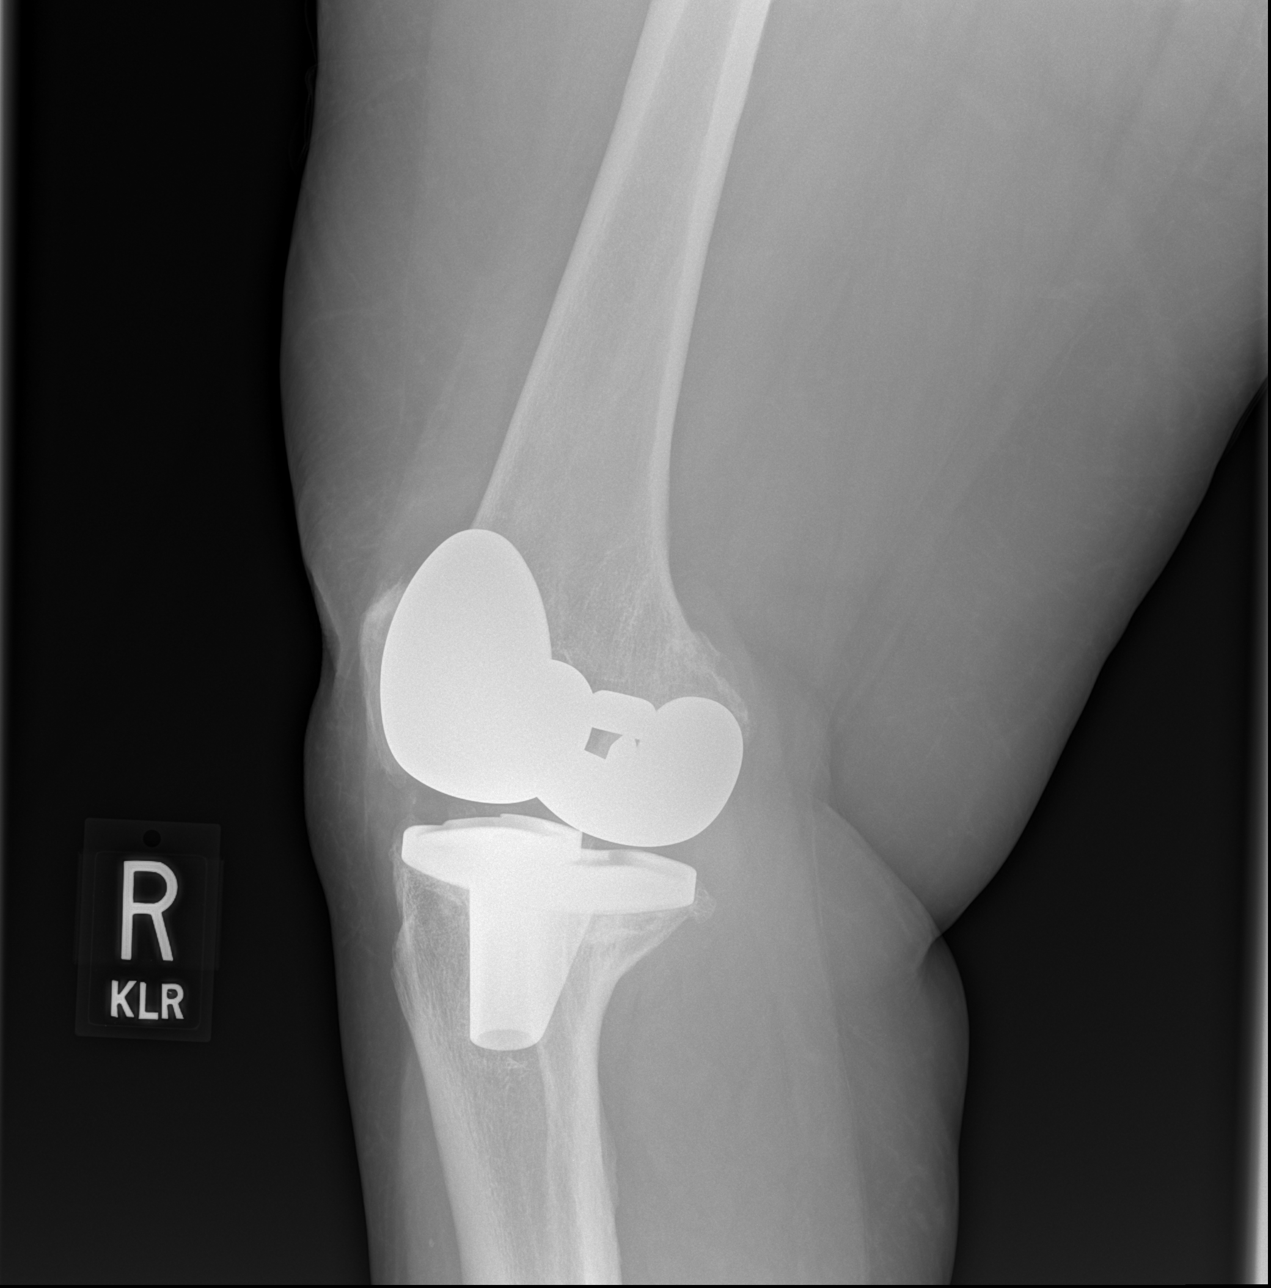
[im 4/4]
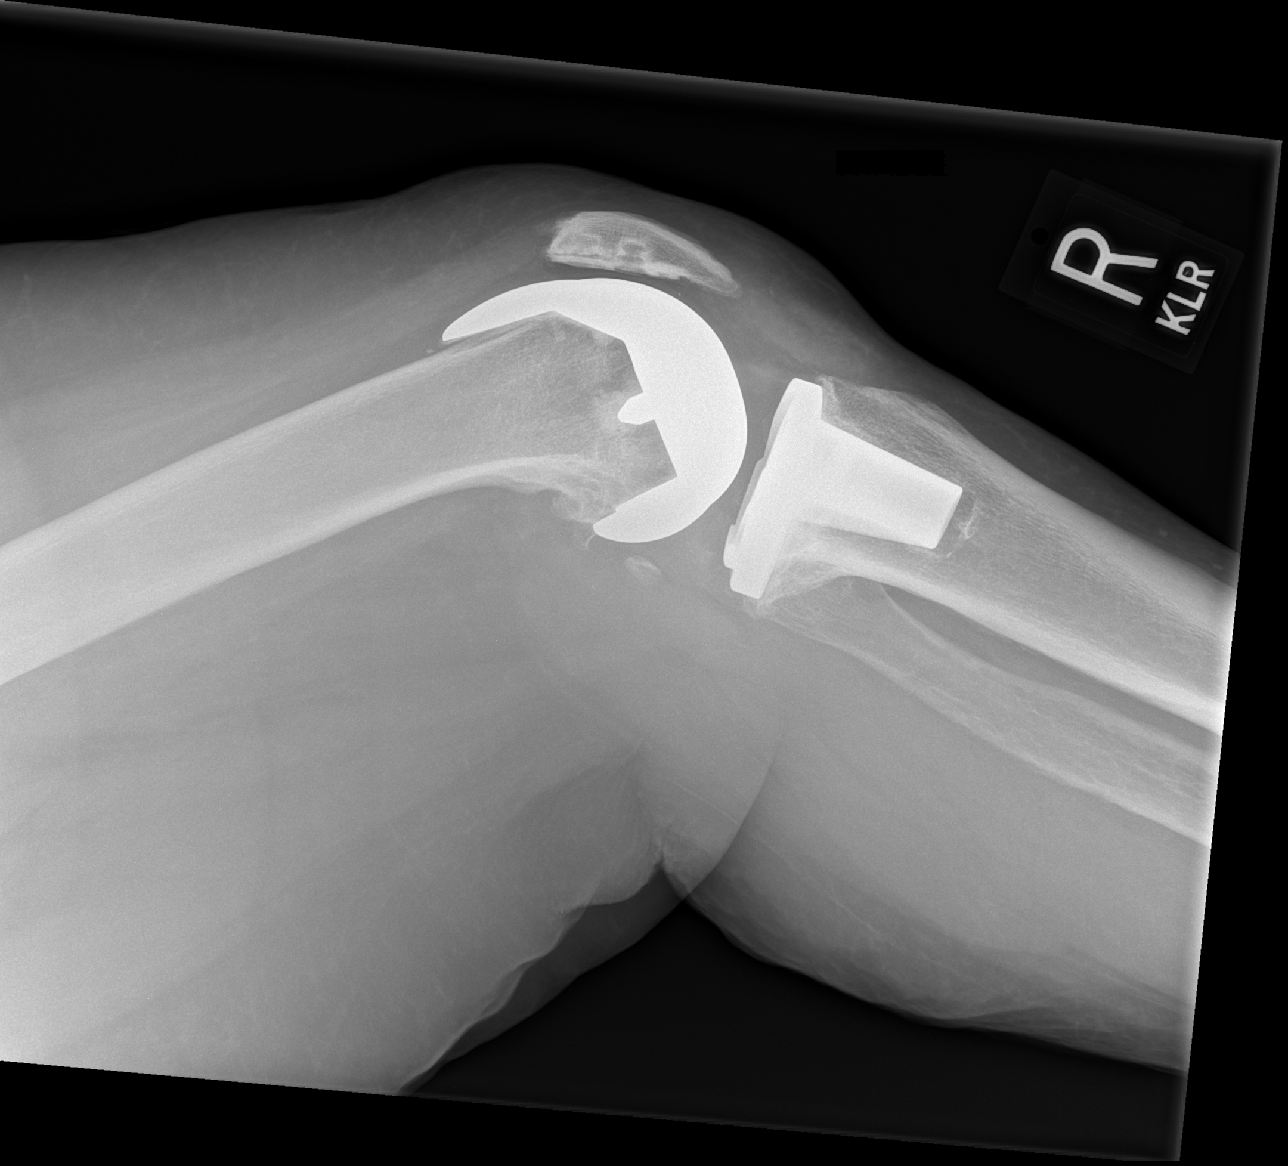

[4 of 4 positions shown; findings below may reference images not displayed]

FINDINGS: The femoral and tibial components appear well seated without
complicating features. Some lucency near the tip of the prosthesis
in the tibia, likely postoperative. No periprosthetic fractures
identified. No joint effusion. Does appear to be a contusion
involving the anterior knee overlying the region the patellar
tendon.
IMPRESSION: No acute bony findings or joint effusion.

## 2020-11-17 DIAGNOSIS — I2089 Other forms of angina pectoris: Secondary | ICD-10-CM | POA: Insufficient documentation

## 2021-02-23 ENCOUNTER — Ambulatory Visit: Payer: Self-pay

## 2022-02-20 DIAGNOSIS — M3501 Sicca syndrome with keratoconjunctivitis: Secondary | ICD-10-CM | POA: Insufficient documentation

## 2022-02-20 DIAGNOSIS — N1831 Chronic kidney disease, stage 3a: Secondary | ICD-10-CM | POA: Insufficient documentation

## 2022-08-10 ENCOUNTER — Ambulatory Visit: Payer: Medicare HMO | Admitting: Podiatry

## 2022-08-23 ENCOUNTER — Other Ambulatory Visit: Payer: Self-pay | Admitting: Family Medicine

## 2022-08-23 DIAGNOSIS — R519 Headache, unspecified: Secondary | ICD-10-CM

## 2022-08-30 ENCOUNTER — Other Ambulatory Visit: Payer: Self-pay | Admitting: Family Medicine

## 2022-08-30 DIAGNOSIS — N63 Unspecified lump in unspecified breast: Secondary | ICD-10-CM

## 2022-09-07 ENCOUNTER — Ambulatory Visit
Admission: RE | Admit: 2022-09-07 | Discharge: 2022-09-07 | Disposition: A | Discharge status: Home / Self Care | Payer: Medicare HMO | Source: Ambulatory Visit | Attending: Family Medicine | Admitting: Family Medicine

## 2022-09-07 DIAGNOSIS — N63 Unspecified lump in unspecified breast: Secondary | ICD-10-CM

## 2022-09-07 DIAGNOSIS — N6322 Unspecified lump in the left breast, upper inner quadrant: Secondary | ICD-10-CM | POA: Insufficient documentation

## 2022-09-07 DIAGNOSIS — N6312 Unspecified lump in the right breast, upper inner quadrant: Secondary | ICD-10-CM | POA: Diagnosis not present

## 2022-09-07 DIAGNOSIS — Z1239 Encounter for other screening for malignant neoplasm of breast: Secondary | ICD-10-CM | POA: Insufficient documentation

## 2022-09-07 DIAGNOSIS — R519 Headache, unspecified: Secondary | ICD-10-CM | POA: Insufficient documentation

## 2022-09-11 ENCOUNTER — Other Ambulatory Visit: Payer: Self-pay | Admitting: Family Medicine

## 2022-09-11 DIAGNOSIS — R928 Other abnormal and inconclusive findings on diagnostic imaging of breast: Secondary | ICD-10-CM

## 2022-09-11 DIAGNOSIS — N63 Unspecified lump in unspecified breast: Secondary | ICD-10-CM

## 2022-09-12 ENCOUNTER — Ambulatory Visit
Admission: RE | Admit: 2022-09-12 | Discharge: 2022-09-12 | Disposition: A | Discharge status: Home / Self Care | Payer: Medicare HMO | Source: Ambulatory Visit | Attending: Family Medicine | Admitting: Family Medicine

## 2022-09-12 DIAGNOSIS — R928 Other abnormal and inconclusive findings on diagnostic imaging of breast: Secondary | ICD-10-CM

## 2022-09-12 DIAGNOSIS — N63 Unspecified lump in unspecified breast: Secondary | ICD-10-CM

## 2022-09-12 HISTORY — PX: OTHER SURGICAL HISTORY: SHX169

## 2022-09-12 HISTORY — PX: BREAST BIOPSY: SHX20

## 2022-09-12 MED ORDER — LIDOCAINE HCL (PF) 1 % IJ SOLN
10.0000 mL | Freq: Once | INTRAMUSCULAR | Status: AC
  Administered 2022-09-12: 10 mL
  Filled 2022-09-12: qty 10

## 2022-09-14 ENCOUNTER — Encounter: Payer: Self-pay | Admitting: *Deleted

## 2022-09-14 DIAGNOSIS — C50911 Malignant neoplasm of unspecified site of right female breast: Secondary | ICD-10-CM

## 2022-09-14 DIAGNOSIS — C50919 Malignant neoplasm of unspecified site of unspecified female breast: Secondary | ICD-10-CM

## 2022-09-14 NOTE — Progress Notes (Signed)
Received referral for newly diagnosed breast cancer from [facility].  Navigation initiated.  Referral sent to Dr. Donne Hazel per patient preference.   She will see Dr. Janese Banks on Monday at 19.

## 2022-09-17 ENCOUNTER — Encounter: Payer: Self-pay | Admitting: Oncology

## 2022-09-17 ENCOUNTER — Inpatient Hospital Stay: Payer: Medicare HMO | Attending: Oncology | Admitting: Oncology

## 2022-09-17 ENCOUNTER — Encounter: Payer: Self-pay | Admitting: *Deleted

## 2022-09-17 ENCOUNTER — Inpatient Hospital Stay: Payer: Medicare HMO

## 2022-09-17 VITALS — BP 147/66 | HR 71 | Temp 98.6°F | Resp 20 | Wt 211.7 lb

## 2022-09-17 DIAGNOSIS — Z9049 Acquired absence of other specified parts of digestive tract: Secondary | ICD-10-CM

## 2022-09-17 DIAGNOSIS — Z8249 Family history of ischemic heart disease and other diseases of the circulatory system: Secondary | ICD-10-CM

## 2022-09-17 DIAGNOSIS — Z825 Family history of asthma and other chronic lower respiratory diseases: Secondary | ICD-10-CM

## 2022-09-17 DIAGNOSIS — M109 Gout, unspecified: Secondary | ICD-10-CM | POA: Diagnosis not present

## 2022-09-17 DIAGNOSIS — Z87891 Personal history of nicotine dependence: Secondary | ICD-10-CM | POA: Diagnosis not present

## 2022-09-17 DIAGNOSIS — Z803 Family history of malignant neoplasm of breast: Secondary | ICD-10-CM | POA: Diagnosis not present

## 2022-09-17 DIAGNOSIS — C50411 Malignant neoplasm of upper-outer quadrant of right female breast: Secondary | ICD-10-CM | POA: Insufficient documentation

## 2022-09-17 DIAGNOSIS — Z801 Family history of malignant neoplasm of trachea, bronchus and lung: Secondary | ICD-10-CM

## 2022-09-17 DIAGNOSIS — Z885 Allergy status to narcotic agent status: Secondary | ICD-10-CM

## 2022-09-17 DIAGNOSIS — Z881 Allergy status to other antibiotic agents status: Secondary | ICD-10-CM | POA: Diagnosis not present

## 2022-09-17 DIAGNOSIS — Z808 Family history of malignant neoplasm of other organs or systems: Secondary | ICD-10-CM | POA: Diagnosis not present

## 2022-09-17 DIAGNOSIS — Z170 Estrogen receptor positive status [ER+]: Secondary | ICD-10-CM | POA: Diagnosis not present

## 2022-09-17 DIAGNOSIS — C50412 Malignant neoplasm of upper-outer quadrant of left female breast: Secondary | ICD-10-CM

## 2022-09-17 DIAGNOSIS — Z90710 Acquired absence of both cervix and uterus: Secondary | ICD-10-CM

## 2022-09-17 DIAGNOSIS — Z79899 Other long term (current) drug therapy: Secondary | ICD-10-CM

## 2022-09-17 DIAGNOSIS — Z7189 Other specified counseling: Secondary | ICD-10-CM

## 2022-09-17 LAB — SURGICAL PATHOLOGY

## 2022-09-17 NOTE — Progress Notes (Signed)
Hematology/Oncology Consult note [facility] [phone] [phone]  Patient Care Team: Hilton Sinclair, PA-C as PCP - General (Family Medicine) Daiva Huge, RN as Oncology Nurse Navigator   Name of the patient: Martha Chung  [id number]  02/05/1941    Reason for referral-new diagnosis of breast cancer   Referring [place], [place]  Date of visit: 09/17/22   History of presenting illness-patient is a 82 year oldFemale who had undergone screening mammograms up until the age of 67.  She self palpated a right breast mass which led to a bilateral diagnostic mammogram.  This showed a 1.6 cm mass in the 10 o'clock position of the right breast and a 4 mm irregular mass in the outer upper quadrant of the left breast.  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  Left breast biopsy showed grade 1 invasive mammary carcinoma with tubular features ER/PR and HER2 pending.  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  She is independent of her ADLs and IADLs.  No prior history of breast cancer although she has had a left lumpectomy for benign lesion many years ago.  Family history of breast cancer in her mother at the age of 83.  ECOG PS- 1  Pain scale- 0   Review of systems- Review of Systems  Constitutional:  Negative for chills, fever, malaise/fatigue and weight loss.  HENT:  Negative for congestion, ear discharge and nosebleeds.   Eyes:  Negative for blurred vision.  Respiratory:  Negative for cough, hemoptysis, sputum production, shortness of breath and wheezing.   Cardiovascular:  Negative for chest pain, palpitations, orthopnea and claudication.  Gastrointestinal:  Negative for abdominal pain, blood in stool, constipation, diarrhea, heartburn, melena, nausea and vomiting.  Genitourinary:  Negative for dysuria, flank pain,  frequency, hematuria and urgency.  Musculoskeletal:  Negative for back pain, joint pain and myalgias.  Skin:  Negative for rash.  Neurological:  Negative for dizziness, tingling, focal weakness, seizures, weakness and headaches.  Endo/Heme/Allergies:  Does not bruise/bleed easily.  Psychiatric/Behavioral:  Negative for depression and suicidal ideas. The patient does not have insomnia.     Allergies  Allergen Reactions   Ivp Dye [Iodinated Contrast Media] Anaphylaxis    Topical betadine is ok.   Morphine And Related Anaphylaxis    Respiratory Arrest   Shellfish Allergy Anaphylaxis   Dilaudid [Hydromorphone Hcl]     BP bottomed out   Macrodantin [Nitrofurantoin] Nausea And Vomiting    And diarrhea   Nubain [Nalbuphine Hcl] Other (See Comments)    BP "bottoms out"    Patient Active Problem List   Diagnosis Date Noted   Malignant neoplasm of upper-outer quadrant of right breast in female, estrogen receptor positive ([place]) 09/17/2022   SBO (small bowel obstruction) ([place]) 09/11/2019   COPD (chronic obstructive pulmonary disease) ([place]) 09/11/2019   Chronic diastolic CHF (congestive heart failure) ([place]) 09/11/2019   Leukocytosis 09/11/2019   Adjustment disorder with mixed anxiety and depressed mood 02/17/2015   Other postoperative complication involving digestive system    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    Arthritis 02/12/2015   BP (high blood pressure) 02/12/2015   Diverticulitis large intestine 01/31/2015   Diverticulitis 12/07/2014   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   Essential (primary) hypertension 03/31/2014   Acid reflux 03/31/2014   Gout 03/31/2014   Adult hypothyroidism 03/31/2014   Osteopenia 03/31/2014  Past Medical History:  Diagnosis Date   Arthritis    hands, feet, "all over"   CHF (congestive heart failure) (HCC)    acute, after use of Redux   Complication of anesthesia    on vent after colostomy revision   COPD (chronic obstructive pulmonary  disease) (HCC)    chronic bronchitis - no meds   Diabetes mellitus without complication (HCC)    Diverticulitis    Full dentures    upper and lower   GERD (gastroesophageal reflux disease)    Gout    Hypertension    Hypothyroidism    Lichen sclerosus    Vaginal Area   Mitral regurgitation    after use of redux   Right rotator cuff tendonitis    Wears contact lenses      Past Surgical History:  Procedure Laterality Date   ABDOMINAL HYSTERECTOMY  0000000   Partial   APPLICATION OF WOUND VAC  02/14/2015   Procedure: APPLICATION OF WOUND VAC;  Surgeon: Sherri Rad, MD;  Location: [facility];  Service: General;;   BREAST BIOPSY Right 09/12/2022   u/s 10:00 coil path pending   BREAST BIOPSY Left 09/12/2022   2:00 venus [place] bx path pending   breast biopsy Left 09/12/2022   [place] bx mass 2:00 venus marker, path pending   BREAST BIOPSY Left 09/12/2022   [place] LT BREAST BX W LOC DEV 1ST LESION IMG BX [place] GUIDE 09/12/2022 [facility]   BREAST BIOPSY Right 09/12/2022   [place] RT BREAST BX W LOC DEV 1ST LESION IMG BX [place] US GUIDE 09/12/2022 [facility]   BREAST EXCISIONAL BIOPSY Left 1974   benign   CHOLECYSTECTOMY     COLECTOMY WITH COLOSTOMY CREATION/HARTMANN PROCEDURE  07/03/2014   COLONOSCOPY N/A 11/25/2014   Procedure: COLONOSCOPY;  Surgeon: Lucilla Lame, MD;  Location: [place];  Service: Gastroenterology;  Laterality: N/A;   COLOSTOMY TAKEDOWN N/A 01/31/2015   Procedure: COLOSTOMY TAKEDOWN, partial colectomy;  Surgeon: Sherri Rad, MD;  Location: [facility];  Service: General;  Laterality: N/A;   CORRECTION OVERLAPPING TOES Bilateral 2012   bilateral hammertoes #2 & #3 both feet   HERNIA REPAIR  07/02/2013   JOINT REPLACEMENT Right 2006   knee   JOINT REPLACEMENT Left 2008   knee   KIDNEY SURGERY Right    LAPAROTOMY     construct new colostomy and place wound VAC   LAPAROTOMY N/A 02/14/2015   Procedure: RE-OPEN EXPLORATORY LAPAROTOMY, COMPLEX LYSIS OF ADHESIONS,  CONSTRUCTION OF LOOP ILEOSTOMY,;  Surgeon: Sherri Rad, MD;  Location: [facility];  Service: General;  Laterality: N/A;   POLYPECTOMY  11/25/2014   Procedure: POLYPECTOMY INTESTINAL;  Surgeon: Lucilla Lame, MD;  Location: [place];  Service: Gastroenterology;;   TONSILLECTOMY      Social History   Socioeconomic History   Marital status: Widowed    Spouse name: Not on file   Number of children: Not on file   Years of education: Not on file   Highest education level: Not on file  Occupational History   Not on file  Tobacco Use   Smoking status: Former    Types: Cigarettes    Quit date: 07/03/1999    Years since quitting: 23.2   Smokeless tobacco: Never  Substance and Sexual Activity   Alcohol use: No   Drug use: No   Sexual activity: Never    Birth control/protection: None  Other Topics Concern   Not on file  Social History Narrative   Not  on file   Social Determinants of Health   Financial Resource Strain: Not on file  Food Insecurity: Not on file  Transportation Needs: Not on file  Physical Activity: Not on file  Stress: Not on file  Social Connections: Not on file  Intimate Partner Violence: Not on file     Family History  Problem Relation Age of Onset   Hypertension Mother    COPD Mother    Heart disease Mother    Cancer Mother        Breast   Heart disease Father    COPD Father    Cancer Brother 28       Lung   Cancer Daughter 7       Brain     Current Outpatient Medications:    allopurinol (ZYLOPRIM) 100 MG tablet, Take 100 mg by mouth daily. AM, Disp: , Rfl:    Calcium Carb-Cholecalciferol (CALCIUM 600 + D PO), Take 600 mg elemental calcium/kg/hr by mouth daily. , Disp: , Rfl:    clobetasol cream (TEMOVATE) [id number] %, Apply 1 application topically 2 (two) times daily., Disp: , Rfl:    diphenoxylate-atropine (LOMOTIL) 2.5-0.025 MG tablet, Take 1 tablet by mouth 4 (four) times daily as needed for diarrhea or loose stools., Disp: 120 tablet, Rfl: 2    famotidine (PEPCID) 20 MG tablet, Take 20 mg by mouth 2 (two) times daily., Disp: , Rfl:    furosemide (LASIX) 20 MG tablet, Take 20 mg by mouth every morning. AM, Disp: , Rfl:    levothyroxine (SYNTHROID) 88 MCG tablet, Take 88 mcg by mouth daily before breakfast. , Disp: , Rfl:    meloxicam (MOBIC) 7.5 MG tablet, Take 1 tablet by mouth 2 (two) times daily. , Disp: , Rfl:    metoprolol (LOPRESSOR) 50 MG tablet, Take 50 mg by mouth 2 (two) times daily., Disp: , Rfl:    Multiple Vitamin (MULTIVITAMIN) tablet, Take 1 tablet by mouth daily., Disp: , Rfl:    Physical exam:  Vitals:   09/17/22 1111  BP: (!) 147/66  Pulse: 71  Resp: 20  Temp: 98.6 F (37 C)  SpO2: 100%  Weight: 211 lb 11.2 oz (96 kg)   Physical Exam Cardiovascular:     Rate and Rhythm: Normal rate and regular rhythm.     Heart sounds: Normal heart sounds.  Pulmonary:     Effort: Pulmonary effort is normal.     Breath sounds: Normal breath sounds.  Abdominal:     General: Bowel sounds are normal.     Palpations: Abdomen is soft.  Skin:    General: Skin is warm and dry.  Neurological:     Mental Status: She is alert and oriented to person, place, and time.           Latest Ref Rng & Units 09/14/2019    5:27 AM  CMP  Glucose 70 - 99 mg/dL 107   BUN 8 - 23 mg/dL 11   Creatinine 0.44 - 1.00 mg/dL 0.68   Sodium 135 - 145 mmol/L 140   Potassium 3.5 - 5.1 mmol/L 3.2   Chloride 98 - 111 mmol/L 110   CO2 22 - 32 mmol/L 23   Calcium 8.9 - 10.3 mg/dL 7.8       Latest Ref Rng & Units 09/13/2019    7:45 AM  CBC  WBC 4.0 - 10.5 K/uL 6.4   Hemoglobin 12.0 - 15.0 g/dL 12.3   Hematocrit 36.0 - 46.0 % 39.9  Platelets 150 - 400 K/uL 135     No images are attached to the encounter.  [place] LT BREAST BX W LOC DEV 1ST LESION IMG BX SPEC US GUIDE  Addendum Date: 09/14/2022   ADDENDUM REPORT: 09/14/2022 12:38 ADDENDUM: PATHOLOGY revealed: Site A. BREAST, RIGHT, 10:00, 5 CM FROM NIPPLE; ULTRASOUND-GUIDED BIOPSY: -  INVASIVE MAMMARY CARCINOMA, ductal, NOS. 11 mm in this sample. Grade 2. Ductal carcinoma in situ: Not identified. Lymphovascular invasion: Not identified. Pathology results are CONCORDANT with imaging findings, per Dr. Fidela Salisbury. PATHOLOGY revealed: Site B. BREAST, LEFT, 2:00, 6 CM FROM NIPPLE; ULTRASOUND-GUIDED BIOPSY: - INVASIVE MAMMARY CARCINOMA, ductal with tubular features. 4 mm in this sample. Grade 1. Ductal carcinoma in situ: Present. Lymphovascular invasion: Not identified. Pathology results are CONCORDANT with imaging findings, per Dr. Fidela Salisbury. Pathology results and recommendations below were discussed with patient by telephone on 09/14/2022. Patient reported biopsy site within normal limits with slight tenderness at the site. Post biopsy care instructions were reviewed, questions were answered and my direct phone number was provided to patient. Patient was instructed to call [facility] if any concerns or questions arise related to the biopsy. RECOMMENDATIONS: 1. Surgical and oncological consultation. Request for surgical and oncological consultation relayed to Casper Harrison RN at [facility] by Electa Sniff RN on 09/14/2022. Pathology results reported by Electa Sniff RN on 09/14/2022. Electronically Signed   By: Fidela Salisbury M.D.   On: 09/14/2022 12:38   Result Date: 09/14/2022 CLINICAL DATA:  Left breast 2 o'clock indeterminate mass. EXAM: ULTRASOUND GUIDED LEFT BREAST CORE NEEDLE BIOPSY COMPARISON:  Previous exam(s). PROCEDURE: I met with the patient and we discussed the procedure of ultrasound-guided biopsy, including benefits and alternatives. We discussed the high likelihood of a successful procedure. We discussed the risks of the procedure, including infection, bleeding, tissue injury, clip migration, and inadequate sampling. Informed written consent was given. The usual time-out protocol was performed immediately prior to the procedure. Lesion  quadrant: Upper outer quadrant Using sterile technique and 1% Lidocaine as local anesthetic, under direct ultrasound visualization, a 14 gauge spring-loaded device was used to perform biopsy of left breast 2 o'clock mass using a inferior approach. At the conclusion of the procedure venus shaped tissue marker clip was deployed into the biopsy cavity. Follow up 2 view mammogram was performed and dictated separately. IMPRESSION: Ultrasound guided biopsy of left breast.  No apparent complications. Electronically Signed: By: Fidela Salisbury M.D. On: 09/12/2022 11:49   [place] RT BREAST BX W LOC DEV 1ST LESION IMG BX SPEC US GUIDE  Addendum Date: 09/14/2022   ADDENDUM REPORT: 09/14/2022 12:38 ADDENDUM: PATHOLOGY revealed: Site A. BREAST, RIGHT, 10:00, 5 CM FROM NIPPLE; ULTRASOUND-GUIDED BIOPSY: - INVASIVE MAMMARY CARCINOMA, ductal, NOS. 11 mm in this sample. Grade 2. Ductal carcinoma in situ: Not identified. Lymphovascular invasion: Not identified. Pathology results are CONCORDANT with imaging findings, per Dr. Fidela Salisbury. PATHOLOGY revealed: Site B. BREAST, LEFT, 2:00, 6 CM FROM NIPPLE; ULTRASOUND-GUIDED BIOPSY: - INVASIVE MAMMARY CARCINOMA, ductal with tubular features. 4 mm in this sample. Grade 1. Ductal carcinoma in situ: Present. Lymphovascular invasion: Not identified. Pathology results are CONCORDANT with imaging findings, per Dr. Fidela Salisbury. Pathology results and recommendations below were discussed with patient by telephone on 09/14/2022. Patient reported biopsy site within normal limits with slight tenderness at the site. Post biopsy care instructions were reviewed, questions were answered and my direct phone number was provided to patient. Patient was instructed to call [facility] if  any concerns or questions arise related to the biopsy. RECOMMENDATIONS: 1. Surgical and oncological consultation. Request for surgical and oncological consultation relayed to Casper Harrison RN at [facility] by Electa Sniff RN on 09/14/2022. Pathology results reported by Electa Sniff RN on 09/14/2022. Electronically Signed   By: Fidela Salisbury M.D.   On: 09/14/2022 12:38   Result Date: 09/14/2022 CLINICAL DATA:  Right breast 10 o'clock mass. EXAM: ULTRASOUND GUIDED RIGHT BREAST CORE NEEDLE BIOPSY COMPARISON:  Previous exam(s). PROCEDURE: I met with the patient and we discussed the procedure of ultrasound-guided biopsy, including benefits and alternatives. We discussed the high likelihood of a successful procedure. We discussed the risks of the procedure, including infection, bleeding, tissue injury, clip migration, and inadequate sampling. Informed written consent was given. The usual time-out protocol was performed immediately prior to the procedure. Lesion quadrant: Upper outer quadrant Using sterile technique and 1% Lidocaine as local anesthetic, under direct ultrasound visualization, a 14 gauge spring-loaded device was used to perform biopsy of right breast 10 o'clock mass using a inferior approach. At the conclusion of the procedure coil shaped tissue marker clip was deployed into the biopsy cavity. Follow up 2 view mammogram was performed and dictated separately. IMPRESSION: Ultrasound guided biopsy of right breast. No apparent complications. Electronically Signed: By: Fidela Salisbury M.D. On: 09/12/2022 11:50   MM CLIP PLACEMENT RIGHT  Result Date: 09/12/2022 CLINICAL DATA:  Post ultrasound-guided core needle biopsy of right breast 10 o'clock mass. EXAM: 3D DIAGNOSTIC RIGHT MAMMOGRAM POST ULTRASOUND BIOPSY COMPARISON:  Previous exam(s). FINDINGS: 3D Mammographic images were obtained following ultrasound guided biopsy of right breast 10 o'clock mass. The biopsy marking clip is in expected position at the site of biopsy. IMPRESSION: Appropriate positioning of the coil shaped biopsy marking clip at the site of biopsy in the right breast 10 o'clock. Final Assessment: Post Procedure  Mammograms for Marker Placement Electronically Signed   By: Fidela Salisbury M.D.   On: 09/12/2022 11:48  MM CLIP PLACEMENT LEFT  Result Date: 09/12/2022 CLINICAL DATA:  Post ultrasound-guided core needle biopsy of left breast 2 o'clock mass. EXAM: 3D DIAGNOSTIC LEFT MAMMOGRAM POST ULTRASOUND BIOPSY COMPARISON:  Previous exam(s). FINDINGS: 3D Mammographic images were obtained following ultrasound guided biopsy of the left breast. The biopsy marking clip is in expected position at the site of biopsy. IMPRESSION: Appropriate positioning of the Venus shaped biopsy marking clip at the site of biopsy in the left 2 o'clock breast. Final Assessment: Post Procedure Mammograms for Marker Placement Electronically Signed   By: Fidela Salisbury M.D.   On: 09/12/2022 11:47  MR BRAIN WO CONTRAST  Result Date: 09/09/2022 CLINICAL DATA:  New onset headaches. Daily frontal headaches x3 months. EXAM: MRI HEAD WITHOUT CONTRAST TECHNIQUE: Multiplanar, multiecho pulse sequences of the brain and surrounding structures were obtained without intravenous contrast. COMPARISON:  MRI brain 02/10/2020. FINDINGS: Brain: No acute infarct or hemorrhage. Unchanged mild chronic small-vessel disease. No hydrocephalus or extra-axial collection. No mass or midline shift. Vascular: Normal flow voids. Skull and upper cervical spine: Normal marrow signal. Sinuses/Orbits: Unremarkable. Other: None. IMPRESSION: No acute intracranial abnormality or mass. No findings to explain headaches. Electronically Signed   By: Emmit Alexanders M.D.   On: 09/09/2022 10:28   MM DIAG BREAST TOMO BILATERAL  Result Date: 09/07/2022 CLINICAL DATA:  Patient describes a new palpable lump in the RIGHT breast. EXAM: DIGITAL DIAGNOSTIC BILATERAL MAMMOGRAM WITH TOMOSYNTHESIS; ULTRASOUND LEFT BREAST LIMITED; ULTRASOUND RIGHT BREAST LIMITED TECHNIQUE: Bilateral digital diagnostic mammography and  breast tomosynthesis was performed.; Targeted ultrasound examination of  the left breast was performed.; Targeted ultrasound examination of the right breast was performed COMPARISON:  None available. ACR Breast Density Category b: There are scattered areas of fibroglandular density. FINDINGS: RIGHT breast: There is a spiculated mass with associated distortion in the outer RIGHT breast, at anterior depth, corresponding to the palpable area of concern, measuring approximately 2 cm greatest dimension. LEFT breast: LEFT nipple inversion is chronic, per the patient. There is a small irregular mass within the outer LEFT breast, best seen on cc views, measuring 5 mm greatest dimension. RIGHT breast: Targeted ultrasound is performed, showing a spiculated shadowing mass in the RIGHT breast at the 10 o'clock axis, 5 cm from the nipple, measuring 1.6 x 1.4 x 1.6 cm, corresponding to the mammographic finding. RIGHT axilla was evaluated with ultrasound showing no enlarged or morphologically abnormal lymph nodes. LEFT breast: Targeted ultrasound is performed, showing an irregular hypoechoic mass in the LEFT breast at the 2 o'clock axis, 6 cm from the nipple, measuring 4 x 3 x 3 mm, a likely correlate for the mammographic finding. LEFT axilla was evaluated with ultrasound showing no enlarged or morphologically abnormal lymph nodes. IMPRESSION: 1. Highly suspicious mass in the RIGHT breast at the 10 o'clock axis, 5 cm from the nipple, measuring 1.6 cm. Ultrasound-guided biopsy is recommended. 2. Irregular hypoechoic mass in the LEFT breast at the 2 o'clock axis, 6 cm from the nipple, measuring 4 mm, a suspicious finding and a likely correlate for the mammographic finding. Ultrasound-guided biopsy is recommended. RECOMMENDATION: 1. Ultrasound-guided biopsy for the RIGHT breast mass at the 10 o'clock axis. 2. Ultrasound-guided biopsy for the LEFT breast mass at the 2 o'clock axis. 3. Close attention to the postprocedure mammogram to ensure mammographic and sonographic correspondence for the LEFT breast  mass. Ultrasound-guided biopsies will be scheduled at patient's convenience. I have discussed the findings and recommendations with the patient. If applicable, a reminder letter will be sent to the patient regarding the next appointment. BI-RADS CATEGORY  5: Highly suggestive of malignancy. Electronically Signed   By: Franki Cabot M.D.   On: 09/07/2022 16:11  US BREAST LTD UNI RIGHT INC AXILLA  Result Date: 09/07/2022 CLINICAL DATA:  Patient describes a new palpable lump in the RIGHT breast. EXAM: DIGITAL DIAGNOSTIC BILATERAL MAMMOGRAM WITH TOMOSYNTHESIS; ULTRASOUND LEFT BREAST LIMITED; ULTRASOUND RIGHT BREAST LIMITED TECHNIQUE: Bilateral digital diagnostic mammography and breast tomosynthesis was performed.; Targeted ultrasound examination of the left breast was performed.; Targeted ultrasound examination of the right breast was performed COMPARISON:  None available. ACR Breast Density Category b: There are scattered areas of fibroglandular density. FINDINGS: RIGHT breast: There is a spiculated mass with associated distortion in the outer RIGHT breast, at anterior depth, corresponding to the palpable area of concern, measuring approximately 2 cm greatest dimension. LEFT breast: LEFT nipple inversion is chronic, per the patient. There is a small irregular mass within the outer LEFT breast, best seen on cc views, measuring 5 mm greatest dimension. RIGHT breast: Targeted ultrasound is performed, showing a spiculated shadowing mass in the RIGHT breast at the 10 o'clock axis, 5 cm from the nipple, measuring 1.6 x 1.4 x 1.6 cm, corresponding to the mammographic finding. RIGHT axilla was evaluated with ultrasound showing no enlarged or morphologically abnormal lymph nodes. LEFT breast: Targeted ultrasound is performed, showing an irregular hypoechoic mass in the LEFT breast at the 2 o'clock axis, 6 cm from the nipple, measuring 4 x 3 x 3 mm, a  likely correlate for the mammographic finding. LEFT axilla was evaluated  with ultrasound showing no enlarged or morphologically abnormal lymph nodes. IMPRESSION: 1. Highly suspicious mass in the RIGHT breast at the 10 o'clock axis, 5 cm from the nipple, measuring 1.6 cm. Ultrasound-guided biopsy is recommended. 2. Irregular hypoechoic mass in the LEFT breast at the 2 o'clock axis, 6 cm from the nipple, measuring 4 mm, a suspicious finding and a likely correlate for the mammographic finding. Ultrasound-guided biopsy is recommended. RECOMMENDATION: 1. Ultrasound-guided biopsy for the RIGHT breast mass at the 10 o'clock axis. 2. Ultrasound-guided biopsy for the LEFT breast mass at the 2 o'clock axis. 3. Close attention to the postprocedure mammogram to ensure mammographic and sonographic correspondence for the LEFT breast mass. Ultrasound-guided biopsies will be scheduled at patient's convenience. I have discussed the findings and recommendations with the patient. If applicable, a reminder letter will be sent to the patient regarding the next appointment. BI-RADS CATEGORY  5: Highly suggestive of malignancy. Electronically Signed   By: Franki Cabot M.D.   On: 09/07/2022 16:11  US BREAST LTD UNI LEFT INC AXILLA  Result Date: 09/07/2022 CLINICAL DATA:  Patient describes a new palpable lump in the RIGHT breast. EXAM: DIGITAL DIAGNOSTIC BILATERAL MAMMOGRAM WITH TOMOSYNTHESIS; ULTRASOUND LEFT BREAST LIMITED; ULTRASOUND RIGHT BREAST LIMITED TECHNIQUE: Bilateral digital diagnostic mammography and breast tomosynthesis was performed.; Targeted ultrasound examination of the left breast was performed.; Targeted ultrasound examination of the right breast was performed COMPARISON:  None available. ACR Breast Density Category b: There are scattered areas of fibroglandular density. FINDINGS: RIGHT breast: There is a spiculated mass with associated distortion in the outer RIGHT breast, at anterior depth, corresponding to the palpable area of concern, measuring approximately 2 cm greatest dimension.  LEFT breast: LEFT nipple inversion is chronic, per the patient. There is a small irregular mass within the outer LEFT breast, best seen on cc views, measuring 5 mm greatest dimension. RIGHT breast: Targeted ultrasound is performed, showing a spiculated shadowing mass in the RIGHT breast at the 10 o'clock axis, 5 cm from the nipple, measuring 1.6 x 1.4 x 1.6 cm, corresponding to the mammographic finding. RIGHT axilla was evaluated with ultrasound showing no enlarged or morphologically abnormal lymph nodes. LEFT breast: Targeted ultrasound is performed, showing an irregular hypoechoic mass in the LEFT breast at the 2 o'clock axis, 6 cm from the nipple, measuring 4 x 3 x 3 mm, a likely correlate for the mammographic finding. LEFT axilla was evaluated with ultrasound showing no enlarged or morphologically abnormal lymph nodes. IMPRESSION: 1. Highly suspicious mass in the RIGHT breast at the 10 o'clock axis, 5 cm from the nipple, measuring 1.6 cm. Ultrasound-guided biopsy is recommended. 2. Irregular hypoechoic mass in the LEFT breast at the 2 o'clock axis, 6 cm from the nipple, measuring 4 mm, a suspicious finding and a likely correlate for the mammographic finding. Ultrasound-guided biopsy is recommended. RECOMMENDATION: 1. Ultrasound-guided biopsy for the RIGHT breast mass at the 10 o'clock axis. 2. Ultrasound-guided biopsy for the LEFT breast mass at the 2 o'clock axis. 3. Close attention to the postprocedure mammogram to ensure mammographic and sonographic correspondence for the LEFT breast mass. Ultrasound-guided biopsies will be scheduled at patient's convenience. I have discussed the findings and recommendations with the patient. If applicable, a reminder letter will be sent to the patient regarding the next appointment. BI-RADS CATEGORY  5: Highly suggestive of malignancy. Electronically Signed   By: Franki Cabot M.D.   On: 09/07/2022 16:11   Assessment  and plan- Patient is a 82 y.o. female with newly  diagnosed bilateral breast cancer  I have reviewed mammogram and ultrasound findings as well as pathology results and discussed in detail with the patient.  Patient found to have a 1.6 cm right breast mass in the upper outer quadrant which was biopsy-proven invasive mammary carcinoma grade 2 ER/PR positive and HER2 negative.  Axillary lymph nodes negative bilaterally on ultrasound.  She was also found to have a 4 mm left breast mass which was biopsy-proven grade 1 invasive mammary carcinoma ER/PR and HER2 pending with tubular features.  Patient will be meeting with Dr. Donne Hazel to discuss her surgical options.  Patient will need a bilateral lumpectomy and at least a sentinel lymph node biopsy on the right side.  Given the small size of the left breast tumor with tubular features it may be okay to forego sentinel lymph node biopsy on the left side.  I discussed what Oncotype testing is and how the results are interpreted.  Patient is interested in getting Oncotype testing done after surgery on her right breast specimen and decide about chemotherapy options based on the test results.  I do not think the patient will need Oncotype testing for her left breast mass since it is fairly small and grade 1.  I will also refer the patient to radiation oncology once we know her surgery date.  Given that her tumor is ER/PR positive there would also be role for endocrine therapy for 5 years following lumpectomy and adjuvant radiation.  Discussed risks and benefits of both tamoxifen and aromatase inhibitors including all but not limited to hot flashes, mood swings.  Risk of arthralgias and worsening bone health associated with AI.  Risk of DVT and cataracts associated with tamoxifen.  Patient is s/p hysterectomy and therefore not at an increased risk of uterine cancer from tamoxifen.  Treatment will be given with a curative intent.  I will obtain a baseline bone density scan at this time.  I am also referring her to  genetic counseling   Cancer Staging  Malignant neoplasm of upper-outer quadrant of right breast in female, estrogen receptor positive ([place]) Staging form: Breast, AJCC 8th Edition - Clinical stage from 09/17/2022: Stage IA (cT1c, cN0, cM0, G2, ER+, PR+, HER2-) - Signed by Sindy Guadeloupe, MD on 09/17/2022 Histologic grading system: 3 grade system     Thank you for this kind referral and the opportunity to participate in the care of this  Patient   Visit Diagnosis 1. Malignant neoplasm of upper-outer quadrant of right breast in female, estrogen receptor positive ([place])   2. Goals of care, counseling/discussion   3. Malignant neoplasm of upper-outer quadrant of left female breast, unspecified estrogen receptor status ([place])     Dr. Randa Evens, MD, MPH [facility] at [facility] [id number] 09/17/2022

## 2022-09-17 NOTE — Progress Notes (Signed)
Accompanied patient and family to initial medical oncology appointment.   Reviewed Breast Cancer treatment handbook.   Care plan summary given to patient.   Reviewed outreach programs and cancer center services.   

## 2022-09-18 ENCOUNTER — Other Ambulatory Visit: Payer: Self-pay | Admitting: Specialist

## 2022-09-19 ENCOUNTER — Other Ambulatory Visit: Payer: Self-pay | Admitting: General Surgery

## 2022-09-19 DIAGNOSIS — Z170 Estrogen receptor positive status [ER+]: Secondary | ICD-10-CM

## 2022-09-19 DIAGNOSIS — C50411 Malignant neoplasm of upper-outer quadrant of right female breast: Secondary | ICD-10-CM

## 2022-09-20 ENCOUNTER — Inpatient Hospital Stay: Payer: Medicare HMO

## 2022-09-20 ENCOUNTER — Other Ambulatory Visit: Payer: Self-pay | Admitting: General Surgery

## 2022-09-20 ENCOUNTER — Inpatient Hospital Stay (HOSPITAL_BASED_OUTPATIENT_CLINIC_OR_DEPARTMENT_OTHER): Payer: Medicare HMO | Admitting: Licensed Clinical Social Worker

## 2022-09-20 ENCOUNTER — Encounter: Payer: Self-pay | Admitting: Licensed Clinical Social Worker

## 2022-09-20 DIAGNOSIS — Z803 Family history of malignant neoplasm of breast: Secondary | ICD-10-CM | POA: Diagnosis not present

## 2022-09-20 DIAGNOSIS — Z170 Estrogen receptor positive status [ER+]: Secondary | ICD-10-CM

## 2022-09-20 DIAGNOSIS — C50411 Malignant neoplasm of upper-outer quadrant of right female breast: Secondary | ICD-10-CM

## 2022-09-20 DIAGNOSIS — Z800 Family history of malignant neoplasm of digestive organs: Secondary | ICD-10-CM | POA: Diagnosis not present

## 2022-09-20 NOTE — Progress Notes (Signed)
REFERRING PROVIDER: Sindy Guadeloupe, MD [place],  [place] [address]  PRIMARY PROVIDER:  Hilton Sinclair, PA-C  PRIMARY REASON FOR VISIT:  1. Malignant neoplasm of upper-outer quadrant of right breast in female, estrogen receptor positive ([place])   2. Family history of breast cancer      HISTORY OF PRESENT ILLNESS:   Martha Chung, a 82 y.o. female, was seen for a [place] cancer genetics consultation at the request of Dr. Janese Banks due to a personal and family history of breast cancer.  Ms. Jervey presents to clinic today to discuss the possibility of a hereditary predisposition to cancer, genetic testing, and to further clarify her future cancer risks, as well as potential cancer risks for family members.   CANCER HISTORY:  In 2024, at the age of 73, Ms. Hendon was diagnosed with invasive mammary carcinoma of the right breast, ER/PR+ HER2- and invasive mammary carcinoma of the left breast, ER/PR+ HER2- The treatment plan includes lumpectomy with Dr. Donne Hazel, Oncotype, adjuvant radiation, and adjuvant endocrine therapy.  Oncology History  Malignant neoplasm of upper-outer quadrant of right breast in female, estrogen receptor positive ([place])  09/17/2022 Initial Diagnosis   Malignant neoplasm of upper-outer quadrant of right breast in female, estrogen receptor positive ([place])   09/17/2022 Cancer Staging   Staging form: Breast, AJCC 8th Edition - Clinical stage from 09/17/2022: Stage IA (cT1c, cN0, cM0, G2, ER+, PR+, HER2-) - Signed by Sindy Guadeloupe, MD on 09/17/2022 Histologic grading system: 3 grade system    RISK FACTORS:  Menarche was at age 72.  First live birth at age 82.  Ovaries intact: no.  Hysterectomy: yes.  Menopausal status: postmenopausal.  HRT use: for short time Colonoscopy: yes;  colectomy .  Past Medical History:  Diagnosis Date   Arthritis    hands, feet, "all over"   CHF (congestive heart failure) (HCC)    acute, after use of Redux   Complication of  anesthesia    on vent after colostomy revision   COPD (chronic obstructive pulmonary disease) (HCC)    chronic bronchitis - no meds   Diabetes mellitus without complication (HCC)    Diverticulitis    Full dentures    upper and lower   GERD (gastroesophageal reflux disease)    Gout    Hypertension    Hypothyroidism    Lichen sclerosus    Vaginal Area   Mitral regurgitation    after use of redux   Right rotator cuff tendonitis    Wears contact lenses     Past Surgical History:  Procedure Laterality Date   ABDOMINAL HYSTERECTOMY  0000000   Partial   APPLICATION OF WOUND VAC  02/14/2015   Procedure: APPLICATION OF WOUND VAC;  Surgeon: Sherri Rad, MD;  Location: [facility];  Service: General;;   BREAST BIOPSY Right 09/12/2022   u/s 10:00 coil path pending   BREAST BIOPSY Left 09/12/2022   2:00 venus [place] bx path pending   breast biopsy Left 09/12/2022   [place] bx mass 2:00 venus marker, path pending   BREAST BIOPSY Left 09/12/2022   [place] LT BREAST BX W LOC DEV 1ST LESION IMG BX [place] US GUIDE 09/12/2022 [facility]   BREAST BIOPSY Right 09/12/2022   [place] RT BREAST BX W LOC DEV 1ST LESION IMG BX [place] US GUIDE 09/12/2022 [facility]   BREAST EXCISIONAL BIOPSY Left 1974   benign   CHOLECYSTECTOMY     COLECTOMY WITH COLOSTOMY CREATION/HARTMANN PROCEDURE  07/03/2014   COLONOSCOPY N/A  11/25/2014   Procedure: COLONOSCOPY;  Surgeon: Lucilla Lame, MD;  Location: [facility];  Service: Gastroenterology;  Laterality: N/A;   COLOSTOMY TAKEDOWN N/A 01/31/2015   Procedure: COLOSTOMY TAKEDOWN, partial colectomy;  Surgeon: Sherri Rad, MD;  Location: [facility];  Service: General;  Laterality: N/A;   CORRECTION OVERLAPPING TOES Bilateral 2012   bilateral hammertoes #2 & #3 both feet   HERNIA REPAIR  07/02/2013   JOINT REPLACEMENT Right 2006   knee   JOINT REPLACEMENT Left 2008   knee   KIDNEY SURGERY Right    LAPAROTOMY     construct new colostomy and place wound VAC   LAPAROTOMY N/A  02/14/2015   Procedure: RE-OPEN EXPLORATORY LAPAROTOMY, COMPLEX LYSIS OF ADHESIONS, CONSTRUCTION OF LOOP ILEOSTOMY,;  Surgeon: Sherri Rad, MD;  Location: [facility];  Service: General;  Laterality: N/A;   POLYPECTOMY  11/25/2014   Procedure: POLYPECTOMY INTESTINAL;  Surgeon: Lucilla Lame, MD;  Location: [facility];  Service: Gastroenterology;;   TONSILLECTOMY      FAMILY HISTORY:  We obtained a detailed, 4-generation family history.  Significant diagnoses are listed below: Family History  Problem Relation Age of Onset   Hypertension Mother    COPD Mother    Heart disease Mother    Breast cancer Mother 58   Heart disease Father    COPD Father    Brain cancer Sister 7       tumor   Lung cancer Brother 21   Colon cancer Paternal Aunt        d. 29s   Juvenile Diabetes Son     Ms. Whittington has 1 adopted daughter, Freda Munro, and 4 biological sons, no cancers. Ms. Taormina had 1 sister who died at 59 of a brain tumor. She had 1 brother that died of lung cancer at 46.  Ms. Hildenbrandt mother had breast cancer at 60 and passed at 65. Limited information about this side of the family.  Ms. Camhi father died at 69 of COPD. Patient had 3 paternal aunts and 1 uncle. One aunt had colon cancer in her 76s.   Ms. Haughney is unaware of previous family history of genetic testing for hereditary cancer risks. There is no reported Ashkenazi Jewish ancestry. There is no known consanguinity.    GENETIC COUNSELING ASSESSMENT: Ms. Laban is a 82 y.o. female with a personal and family history of breast cancer which is somewhat suggestive of a hereditary cancer syndrome and predisposition to cancer. We, therefore, discussed and recommended the following at today's visit.   DISCUSSION: We discussed that approximately 5-10% of breast cancer is hereditary. Most cases of hereditary breast cancer are associated with BRCA1/BRCA2 genes, although there are other genes associated with hereditary cancer as well. Cancers and  risks are gene specific. We discussed that testing is beneficial for several reasons including knowing about cancer risks, identifying potential screening and risk-reduction options that may be appropriate, and to understand if other family members could be at risk for cancer and allow them to undergo genetic testing.   We reviewed the characteristics, features and inheritance patterns of hereditary cancer syndromes. We also discussed genetic testing, including the appropriate family members to test, the process of testing, insurance coverage and turn-around-time for results. We discussed the implications of a negative, positive and/or variant of uncertain significant result. We recommended Ms. Jackson pursue genetic testing for the [facility] Multi-Cancer+RNA gene panel.   Based on Ms. Romer's personal and family history of cancer, she meets medical criteria for genetic testing.  Despite that she meets criteria, she may still have an out of pocket cost. We discussed that if her out of pocket cost for testing is over $100, the laboratory will call and confirm whether she wants to proceed with testing.  If the out of pocket cost of testing is less than $100 she will be billed by the genetic testing laboratory.   PLAN: After considering the risks, benefits, and limitations, Ms. Locascio provided informed consent to pursue genetic testing and the blood sample was sent to [facility] for analysis of the Multi-Cancer+RNA panel. Results should be available within approximately 2-3 weeks' time, at which point they will be disclosed by telephone to Ms. Feltz, as will any additional recommendations warranted by these results. Ms. Fromme will receive a summary of her genetic counseling visit and a copy of her results once available. This information will also be available in Epic.   Ms. Levi questions were answered to her satisfaction today. Our contact information was provided should additional questions or  concerns arise. Thank you for the referral and allowing [place] to share in the care of your patient.   Faith Rogue, MS, [facility] Genetic Counselor [email or web address] Phone: [phone]  The patient was seen for a total of 25 minutes in face-to-face genetic counseling.  Patient's daughter Freda Munro was present, patient requests we call Freda Munro with results. Dr. Grayland Ormond was available for discussion regarding this case.   _______________________________________________________________________ For Office Staff:  Number of people involved in session: 2 Was an Intern/ student involved with case: no

## 2022-09-21 ENCOUNTER — Encounter: Payer: Self-pay | Admitting: *Deleted

## 2022-09-21 ENCOUNTER — Other Ambulatory Visit: Payer: Self-pay

## 2022-09-21 ENCOUNTER — Encounter (HOSPITAL_BASED_OUTPATIENT_CLINIC_OR_DEPARTMENT_OTHER): Payer: Self-pay | Admitting: General Surgery

## 2022-09-21 DIAGNOSIS — C50411 Malignant neoplasm of upper-outer quadrant of right female breast: Secondary | ICD-10-CM

## 2022-09-21 NOTE — Progress Notes (Signed)
Lumpectomy is scheduled for 10/02/22 with Dr. Donne Hazel.  She will see Dr. Janese Banks and Dr. Baruch Gouty on 10/23/22.  Daughter Freda Munro was notified of the appointments.

## 2022-09-21 NOTE — Progress Notes (Signed)
Chart reviewed by Dr. Rex Kras. Ok to proceed with surgery as planned at [facility] 10/02/22.

## 2022-09-24 ENCOUNTER — Encounter: Payer: Self-pay | Admitting: *Deleted

## 2022-09-24 DIAGNOSIS — Z170 Estrogen receptor positive status [ER+]: Secondary | ICD-10-CM

## 2022-09-25 ENCOUNTER — Ambulatory Visit: Payer: Medicare HMO | Attending: General Surgery | Admitting: Rehabilitation

## 2022-09-25 ENCOUNTER — Encounter: Payer: Self-pay | Admitting: Rehabilitation

## 2022-09-25 ENCOUNTER — Other Ambulatory Visit: Payer: Self-pay

## 2022-09-25 ENCOUNTER — Encounter (HOSPITAL_BASED_OUTPATIENT_CLINIC_OR_DEPARTMENT_OTHER)
Admission: RE | Admit: 2022-09-25 | Discharge: 2022-09-25 | Disposition: A | Discharge status: Home / Self Care | Payer: Medicare HMO | Source: Ambulatory Visit | Attending: General Surgery | Admitting: General Surgery

## 2022-09-25 DIAGNOSIS — C50411 Malignant neoplasm of upper-outer quadrant of right female breast: Secondary | ICD-10-CM | POA: Insufficient documentation

## 2022-09-25 DIAGNOSIS — R293 Abnormal posture: Secondary | ICD-10-CM | POA: Insufficient documentation

## 2022-09-25 DIAGNOSIS — Z170 Estrogen receptor positive status [ER+]: Secondary | ICD-10-CM | POA: Diagnosis not present

## 2022-09-25 DIAGNOSIS — G8929 Other chronic pain: Secondary | ICD-10-CM | POA: Diagnosis not present

## 2022-09-25 DIAGNOSIS — E119 Type 2 diabetes mellitus without complications: Secondary | ICD-10-CM | POA: Insufficient documentation

## 2022-09-25 DIAGNOSIS — Z01818 Encounter for other preprocedural examination: Secondary | ICD-10-CM | POA: Diagnosis not present

## 2022-09-25 DIAGNOSIS — M25511 Pain in right shoulder: Secondary | ICD-10-CM | POA: Insufficient documentation

## 2022-09-25 LAB — BASIC METABOLIC PANEL
Anion gap: 10 (ref 5–15)
BUN: 25 mg/dL — ABNORMAL HIGH (ref 8–23)
CO2: 26 mmol/L (ref 22–32)
Calcium: 9 mg/dL (ref 8.9–10.3)
Chloride: 105 mmol/L (ref 98–111)
Creatinine, Ser: 1.26 mg/dL — ABNORMAL HIGH (ref 0.44–1.00)
GFR, Estimated: 43 mL/min — ABNORMAL LOW (ref >60)
Glucose, Bld: 126 mg/dL — ABNORMAL HIGH (ref 70–99)
Potassium: 4.6 mmol/L (ref 3.5–5.1)
Sodium: 141 mmol/L (ref 135–145)

## 2022-09-25 MED ORDER — ENSURE PRE-SURGERY PO LIQD: 296.0000 mL | Freq: Once | ORAL | Status: DC

## 2022-09-25 NOTE — Therapy (Signed)
OUTPATIENT PHYSICAL THERAPY BREAST CANCER BASELINE EVALUATION   Patient Name: Martha Chung MRN: [id number] DOB:1941-06-17, 82 y.o., female Today's Date: 09/25/2022  END OF SESSION:  PT End of Session - 09/25/22 0934     Visit Number 1    Number of Visits 2    Date for PT Re-Evaluation 11/20/22    PT Start Time 0900    PT Stop Time 0932    PT Time Calculation (min) 32 min    Activity Tolerance Patient tolerated treatment well    Behavior During Therapy WFL for tasks assessed/performed             Past Medical History:  Diagnosis Date   Arthritis    hands, feet, "all over"   CHF (congestive heart failure) ([place])    acute, after use of Redux   Complication of anesthesia    on vent after colostomy revision   COPD (chronic obstructive pulmonary disease) ([facility])    chronic bronchitis - no meds   Diabetes mellitus without complication ([facility])    Diverticulitis    Full dentures    upper and lower   GERD (gastroesophageal reflux disease)    Gout    Hypertension    Hypothyroidism    Lichen sclerosus    Vaginal Area   Mitral regurgitation    after use of redux   Right rotator cuff tendonitis    Wears contact lenses    Past Surgical History:  Procedure Laterality Date   ABDOMINAL HYSTERECTOMY  0000000   Partial   APPLICATION OF WOUND VAC  02/14/2015   Procedure: APPLICATION OF WOUND VAC;  Surgeon: Sherri Rad, MD;  Location: [facility];  Service: General;;   BREAST BIOPSY Right 09/12/2022   u/s 10:00 coil path pending   BREAST BIOPSY Left 09/12/2022   2:00 venus [place] bx path pending   breast biopsy Left 09/12/2022   [place] bx mass 2:00 venus marker, path pending   BREAST BIOPSY Left 09/12/2022   [place] LT BREAST BX W LOC DEV 1ST LESION IMG BX SPEC US GUIDE 09/12/2022 [facility]   BREAST BIOPSY Right 09/12/2022   [place] RT BREAST BX W LOC DEV 1ST LESION IMG BX [place] US GUIDE 09/12/2022 [facility]   BREAST EXCISIONAL BIOPSY Left 1974   benign   CHOLECYSTECTOMY     COLECTOMY  WITH COLOSTOMY CREATION/HARTMANN PROCEDURE  07/03/2014   COLONOSCOPY N/A 11/25/2014   Procedure: COLONOSCOPY;  Surgeon: Lucilla Lame, MD;  Location: [facility];  Service: Gastroenterology;  Laterality: N/A;   COLOSTOMY TAKEDOWN N/A 01/31/2015   Procedure: COLOSTOMY TAKEDOWN, partial colectomy;  Surgeon: Sherri Rad, MD;  Location: [facility];  Service: General;  Laterality: N/A;   CORRECTION OVERLAPPING TOES Bilateral 2012   bilateral hammertoes #2 & #3 both feet   HERNIA REPAIR  07/02/2013   JOINT REPLACEMENT Right 2006   knee   JOINT REPLACEMENT Left 2008   knee   KIDNEY SURGERY Right    LAPAROTOMY     construct new colostomy and place wound VAC   LAPAROTOMY N/A 02/14/2015   Procedure: RE-OPEN EXPLORATORY LAPAROTOMY, COMPLEX LYSIS OF ADHESIONS, CONSTRUCTION OF LOOP ILEOSTOMY,;  Surgeon: Sherri Rad, MD;  Location: [facility];  Service: General;  Laterality: N/A;   POLYPECTOMY  11/25/2014   Procedure: POLYPECTOMY INTESTINAL;  Surgeon: Lucilla Lame, MD;  Location: [facility];  Service: Gastroenterology;;   TONSILLECTOMY     Patient Active Problem List   Diagnosis Date Noted   Malignant neoplasm of upper-outer quadrant of  right breast in female, estrogen receptor positive ([place]) 09/17/2022   SBO (small bowel obstruction) ([place]) 09/11/2019   COPD (chronic obstructive pulmonary disease) ([place]) 09/11/2019   Chronic diastolic CHF (congestive heart failure) ([place]) 09/11/2019   Leukocytosis 09/11/2019   Adjustment disorder with mixed anxiety and depressed mood 02/17/2015   Other postoperative complication involving digestive system    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 Donne Hazel  REFERRING DIAG: C50.411,Z17.0 (ICD-10-CM) - Malignant neoplasm of upper-outer quadrant of right breast in female, estrogen receptor positive ([place])  THERAPY DIAG:  Malignant neoplasm of upper-outer quadrant of right breast in female, estrogen receptor positive ([place])  Abnormal posture  Chronic right shoulder pain  Rationale for Evaluation and Treatment: Rehabilitation  ONSET DATE: 09/07/22  SUBJECTIVE:                                                                                                                                                                                           SUBJECTIVE STATEMENT: Patient reports she is here today to be seen by her medical team for her newly diagnosed bilateral breast cancer. I do have some shoulder issues on that side.  It just seems to pop.    PERTINENT HISTORY:  Will be having bil breast lumpectomy and SLNB only on the Rt on 10/02/22 due to Rt breast ER/PR positive. Other hx includes: heart failure  PATIENT GOALS:   reduce lymphedema risk and learn post op HEP.   PAIN:  Are you having pain? No  PRECAUTIONS: Active CA   HAND DOMINANCE: right  WEIGHT BEARING RESTRICTIONS: No  FALLS:  Has patient fallen in last 6 months? No  LIVING ENVIRONMENT: Patient lives with: alone Will have help with daughter  Has following equipment at home: RW  OCCUPATION: retired Marine scientist   LEISURE: cross stitch, walking to [employer], antique shopping   PRIOR LEVEL OF FUNCTION: Independent   OBJECTIVE:  COGNITION: Overall cognitive status: Within functional limits for tasks assessed    POSTURE:  Forward head and rounded shoulders posture  UPPER EXTREMITY AROM/PROM:  A/PROM RIGHT   eval   Shoulder extension 50  Shoulder flexion 130  Shoulder abduction 160 - pain  Shoulder internal rotation   Shoulder external rotation 80    (Blank rows = not  tested)  A/PROM  LEFT   eval  Shoulder extension 55  Shoulder flexion 140  Shoulder abduction 150  Shoulder internal rotation   Shoulder external rotation 80    (Blank rows = not tested)  CERVICAL AROM: All within normal limits:   LYMPHEDEMA ASSESSMENTS:   LANDMARK RIGHT   eval  10 cm proximal to olecranon process 37.3  Olecranon process 28.3  10 cm proximal to ulnar styloid process 24.7  Just proximal to ulnar styloid process 17.1  Across hand at thumb web space 20.5  At base of 2nd digit 7.0  (Blank rows = not tested)  LANDMARK LEFT   eval  10 cm proximal to olecranon process 39.5  Olecranon process 28.6  10 cm proximal to ulnar styloid process 23.5  Just proximal to ulnar styloid process 18.2  Across hand at thumb web space 21  At base of 2nd digit 6.7  (Blank rows = not tested)  L-DEX LYMPHEDEMA SCREENING:  The patient was assessed using the L-Dex machine today to produce a lymphedema index baseline score. The patient will be reassessed on a regular basis (typically every 3 months) to obtain new L-Dex scores. If the score is > 6.5 points away from his/her baseline score indicating onset of subclinical lymphedema, it will be recommended to wear a compression garment for 4 weeks, 12 hours per day and then be reassessed. If the score continues to be > 6.5 points from baseline at reassessment, we will initiate lymphedema treatment. Assessing in this manner has a 95% rate of preventing clinically significant lymphedema.    QUICK DASH SURVEY: 18%  PATIENT EDUCATION:  Education details: Lymphedema risk reduction and post op shoulder/posture HEP Person educated: Patient Education method: Explanation, Demonstration, Handout Education comprehension: Patient verbalized understanding and returned demonstration  HOME EXERCISE PROGRAM: Patient was instructed today in a home exercise program today for post op shoulder range of motion. These included active assist shoulder flexion in sitting,  scapular retraction, wall walking with shoulder abduction, and hands behind head external rotation.  She was encouraged to do these twice a day, holding 3 seconds and repeating 5 times when permitted by her physician.   ASSESSMENT:  CLINICAL IMPRESSION: Pt will benefit from a post op PT reassessment to determine needs and from L-Dex screens every 3 months for 2 years to detect subclinical lymphedema.  Pt will benefit from skilled therapeutic intervention to improve on the following deficits: Decreased knowledge of precautions, impaired UE functional use, pain, decreased ROM, postural dysfunction.   PT treatment/interventions: ADL/self-care home management, pt/family education, therapeutic exercise  REHAB POTENTIAL: Excellent  CLINICAL DECISION MAKING: Stable/uncomplicated  EVALUATION COMPLEXITY: Low   GOALS: Goals reviewed with patient? YES  LONG TERM GOALS: (STG=LTG)    Name Target Date Goal status  1 Pt will be able to verbalize understanding of pertinent lymphedema risk reduction practices relevant to her dx specifically related to skin care.  Baseline:  No knowledge 09/25/2022 Achieved at eval  2 Pt will be able to return demo and/or verbalize understanding of the post op HEP related to regaining shoulder ROM. Baseline:  No knowledge 09/25/2022 Achieved at eval  3 Pt will be able to verbalize understanding of the importance of attending the post op After Breast CA Class for further lymphedema risk reduction education and therapeutic exercise.  Baseline:  No knowledge 09/25/2022 Achieved at eval  4 Pt will demo she has regained full shoulder ROM and function post operatively compared to baselines.  Baseline: See objective measurements  taken today. 11/20/22     PLAN:  PT FREQUENCY/DURATION: EVAL and 1 follow up appointment.   PLAN FOR NEXT SESSION: will reassess 3-4 weeks post op to determine needs.   Patient will follow up at outpatient cancer rehab 3-4 weeks following  surgery.  If the patient requires physical therapy at that time, a specific plan will be dictated and sent to the referring physician for approval. The patient was educated today on appropriate basic range of motion exercises to begin post operatively and the importance of attending the After Breast Cancer class following surgery.  Patient was educated today on lymphedema risk reduction practices as it pertains to recommendations that will benefit the patient immediately following surgery.  She verbalized good understanding.    Physical Therapy Information for After Breast Cancer Surgery/Treatment:  Lymphedema is a swelling condition that you may be at risk for in your arm if you have lymph nodes removed from the armpit area.  After a sentinel node biopsy, the risk is approximately 5-9% and is higher after an axillary node dissection.  There is treatment available for this condition and it is not life-threatening.  Contact your physician or physical therapist with concerns. You may begin the 4 shoulder/posture exercises (see additional sheet) when permitted by your physician (typically a week after surgery).  If you have drains, you may need to wait until those are removed before beginning range of motion exercises.  A general recommendation is to not lift your arms above shoulder height until drains are removed.  These exercises should be done to your tolerance and gently.  This is not a "no pain/no gain" type of recovery so listen to your body and stretch into the range of motion that you can tolerate, stopping if you have pain.  If you are having immediate reconstruction, ask your plastic surgeon about doing exercises as he or she may want you to wait. We encourage you to attend the free one time ABC (After Breast Cancer) class offered by [facility].  You will learn information related to lymphedema risk, prevention and treatment and additional exercises to regain mobility  following surgery.  You can call [phone] for more information.  This is offered the 1st and 3rd Monday of each month.  You only attend the class one time. While undergoing any medical procedure or treatment, try to avoid blood pressure being taken or needle sticks from occurring on the arm on the side of cancer.   This recommendation begins after surgery and continues for the rest of your life.  This may help reduce your risk of getting lymphedema (swelling in your arm). An excellent resource for those seeking information on lymphedema is the National Lymphedema Network's web site. It can be accessed at [place].org If you notice swelling in your hand, arm or breast at any time following surgery (even if it is many years from now), please contact your doctor or physical therapist to discuss this.  Lymphedema can be treated at any time but it is easier for you if it is treated early on.  If you feel like your shoulder motion is not returning to normal in a reasonable amount of time, please contact your surgeon or physical therapist.  [place] [id number]. [address], [address], [place] [address]  ABC CLASS After Breast Cancer Class  After Breast Cancer Class is a specially designed exercise class to assist you in a safe recover after having breast cancer  surgery.  In this class you will learn how to get back to full function whether your drains were just removed or if you had surgery a month ago.  This one-time class is held the 1st and 3rd Monday of every month from 11:00 a.m. until 12:00 noon virtually.  This class is FREE and space is limited. For more information or to register for the next available class, call [phone].  Class Goals  Understand specific stretches to improve the flexibility of you chest and shoulder. Learn ways to safely strengthen your upper body and improve your posture. Understand the warning signs of infection and  why you may be at risk for an arm infection. Learn about Lymphedema and prevention.  ** You do not attend this class until after surgery.  Drains must be removed to participate  Patient was instructed today in a home exercise program today for post op shoulder range of motion. These included active assist shoulder flexion in sitting, scapular retraction, wall walking with shoulder abduction, and hands behind head external rotation.  She was encouraged to do these twice a day, holding 3 seconds and repeating 5 times when permitted by her physician.    Stark Bray, PT 09/25/2022, 9:34 AM

## 2022-09-25 NOTE — Progress Notes (Signed)
Enhanced Recovery after Surgery  Enhanced Recovery after Surgery is a protocol used to improve the stress on your body and your recovery after surgery.  Patient Instructions  The night before surgery:  No food after midnight. ONLY clear liquids after midnight  The day of surgery (if you do NOT have diabetes):  Drink ONE (1) Pre-Surgery Clear Ensure as directed.   This drink was given to you during your hospital  pre-op appointment visit. The pre-op nurse will instruct you on the time to drink the  Pre-Surgery Ensure depending on your surgery time. Finish the drink at the designated time by the pre-op nurse.  Nothing else to drink after completing the  Pre-Surgery Clear Ensure.  The day of surgery (if you have diabetes): Drink ONE (1) Gatorade 2 (G2) as directed. This drink was given to you during your hospital  pre-op appointment visit.  The pre-op nurse will instruct you on the time to drink the   Gatorade 2 (G2) depending on your surgery time. Color of the Gatorade may vary. Red is not allowed. Nothing else to drink after completing the  Gatorade 2 (G2).         If office.you have questions, please contact your surgeon's officeSurgical soap given with instructions, pt verbalized understanding.Surgical soap given with instructions, pt verbalized understanding.Surgical soap given with instructions, pt verbalized understanding.

## 2022-10-02 ENCOUNTER — Other Ambulatory Visit: Payer: Self-pay

## 2022-10-02 ENCOUNTER — Encounter: Payer: Self-pay | Admitting: Licensed Clinical Social Worker

## 2022-10-02 ENCOUNTER — Telehealth: Payer: Self-pay | Admitting: Licensed Clinical Social Worker

## 2022-10-02 ENCOUNTER — Ambulatory Visit
Admission: RE | Admit: 2022-10-02 | Discharge: 2022-10-02 | Disposition: A | Discharge status: Home / Self Care | Payer: Medicare HMO | Source: Ambulatory Visit | Attending: General Surgery | Admitting: General Surgery

## 2022-10-02 ENCOUNTER — Ambulatory Visit: Payer: Self-pay | Admitting: Licensed Clinical Social Worker

## 2022-10-02 ENCOUNTER — Ambulatory Visit (HOSPITAL_BASED_OUTPATIENT_CLINIC_OR_DEPARTMENT_OTHER): Payer: Medicare HMO | Admitting: Anesthesiology

## 2022-10-02 ENCOUNTER — Ambulatory Visit (HOSPITAL_BASED_OUTPATIENT_CLINIC_OR_DEPARTMENT_OTHER)
Admission: RE | Admit: 2022-10-02 | Discharge: 2022-10-02 | Disposition: A | Discharge status: Home / Self Care | Payer: Medicare HMO | Attending: General Surgery | Admitting: General Surgery

## 2022-10-02 ENCOUNTER — Encounter (HOSPITAL_BASED_OUTPATIENT_CLINIC_OR_DEPARTMENT_OTHER)
Admission: RE | Disposition: A | Discharge status: Home / Self Care | Payer: Self-pay | Source: Home / Self Care | Attending: General Surgery

## 2022-10-02 ENCOUNTER — Encounter (HOSPITAL_BASED_OUTPATIENT_CLINIC_OR_DEPARTMENT_OTHER): Payer: Self-pay | Admitting: General Surgery

## 2022-10-02 DIAGNOSIS — C50911 Malignant neoplasm of unspecified site of right female breast: Secondary | ICD-10-CM | POA: Diagnosis not present

## 2022-10-02 DIAGNOSIS — E1122 Type 2 diabetes mellitus with diabetic chronic kidney disease: Secondary | ICD-10-CM | POA: Diagnosis not present

## 2022-10-02 DIAGNOSIS — I110 Hypertensive heart disease with heart failure: Secondary | ICD-10-CM

## 2022-10-02 DIAGNOSIS — J449 Chronic obstructive pulmonary disease, unspecified: Secondary | ICD-10-CM

## 2022-10-02 DIAGNOSIS — K219 Gastro-esophageal reflux disease without esophagitis: Secondary | ICD-10-CM | POA: Insufficient documentation

## 2022-10-02 DIAGNOSIS — I509 Heart failure, unspecified: Secondary | ICD-10-CM

## 2022-10-02 DIAGNOSIS — Z6841 Body Mass Index (BMI) 40.0 and over, adult: Secondary | ICD-10-CM | POA: Diagnosis not present

## 2022-10-02 DIAGNOSIS — C50912 Malignant neoplasm of unspecified site of left female breast: Secondary | ICD-10-CM | POA: Diagnosis not present

## 2022-10-02 DIAGNOSIS — I5032 Chronic diastolic (congestive) heart failure: Secondary | ICD-10-CM | POA: Diagnosis not present

## 2022-10-02 DIAGNOSIS — Z1379 Encounter for other screening for genetic and chromosomal anomalies: Secondary | ICD-10-CM | POA: Insufficient documentation

## 2022-10-02 DIAGNOSIS — N1831 Chronic kidney disease, stage 3a: Secondary | ICD-10-CM | POA: Diagnosis not present

## 2022-10-02 DIAGNOSIS — E039 Hypothyroidism, unspecified: Secondary | ICD-10-CM | POA: Insufficient documentation

## 2022-10-02 DIAGNOSIS — C50412 Malignant neoplasm of upper-outer quadrant of left female breast: Secondary | ICD-10-CM

## 2022-10-02 DIAGNOSIS — Z803 Family history of malignant neoplasm of breast: Secondary | ICD-10-CM | POA: Insufficient documentation

## 2022-10-02 DIAGNOSIS — Z170 Estrogen receptor positive status [ER+]: Secondary | ICD-10-CM | POA: Diagnosis not present

## 2022-10-02 DIAGNOSIS — I130 Hypertensive heart and chronic kidney disease with heart failure and stage 1 through stage 4 chronic kidney disease, or unspecified chronic kidney disease: Secondary | ICD-10-CM | POA: Diagnosis not present

## 2022-10-02 DIAGNOSIS — Z87891 Personal history of nicotine dependence: Secondary | ICD-10-CM | POA: Insufficient documentation

## 2022-10-02 DIAGNOSIS — E119 Type 2 diabetes mellitus without complications: Secondary | ICD-10-CM

## 2022-10-02 HISTORY — PX: BREAST LUMPECTOMY WITH RADIOACTIVE SEED AND SENTINEL LYMPH NODE BIOPSY: SHX6550

## 2022-10-02 HISTORY — PX: BREAST BIOPSY: SHX20

## 2022-10-02 HISTORY — PX: BREAST LUMPECTOMY WITH RADIOACTIVE SEED LOCALIZATION: SHX6424

## 2022-10-02 LAB — GLUCOSE, CAPILLARY
Glucose-Capillary: 119 mg/dL — ABNORMAL HIGH (ref 70–99)
Glucose-Capillary: 126 mg/dL — ABNORMAL HIGH (ref 70–99)

## 2022-10-02 SURGERY — BREAST LUMPECTOMY WITH RADIOACTIVE SEED AND SENTINEL LYMPH NODE BIOPSY
Anesthesia: Regional | Site: Breast | Laterality: Right

## 2022-10-02 MED ORDER — LIDOCAINE 2% (20 MG/ML) 5 ML SYRINGE
INTRAMUSCULAR | Status: AC
  Filled 2022-10-02: qty 5

## 2022-10-02 MED ORDER — CHLORHEXIDINE GLUCONATE CLOTH 2 % EX PADS: 6.0000 | MEDICATED_PAD | Freq: Once | CUTANEOUS | Status: DC

## 2022-10-02 MED ORDER — FENTANYL CITRATE (PF) 100 MCG/2ML IJ SOLN
100.0000 ug | Freq: Once | INTRAMUSCULAR | Status: AC
  Administered 2022-10-02: 50 ug via INTRAVENOUS

## 2022-10-02 MED ORDER — ACETAMINOPHEN 500 MG PO TABS
ORAL_TABLET | ORAL | Status: AC
  Filled 2022-10-02: qty 2

## 2022-10-02 MED ORDER — ONDANSETRON HCL 4 MG/2ML IJ SOLN: 4.0000 mg | Freq: Once | INTRAMUSCULAR | Status: DC | PRN

## 2022-10-02 MED ORDER — FENTANYL CITRATE (PF) 100 MCG/2ML IJ SOLN
25.0000 ug | INTRAMUSCULAR | Status: DC | PRN
  Administered 2022-10-02: 25 ug via INTRAVENOUS

## 2022-10-02 MED ORDER — LACTATED RINGERS IV SOLN: INTRAVENOUS | Status: DC

## 2022-10-02 MED ORDER — FENTANYL CITRATE (PF) 100 MCG/2ML IJ SOLN
INTRAMUSCULAR | Status: AC
  Filled 2022-10-02: qty 2

## 2022-10-02 MED ORDER — BUPIVACAINE-EPINEPHRINE (PF) 0.5% -1:200000 IJ SOLN
INTRAMUSCULAR | Status: DC | PRN
  Administered 2022-10-02: 30 mL via PERINEURAL

## 2022-10-02 MED ORDER — CEFAZOLIN SODIUM-DEXTROSE 2-4 GM/100ML-% IV SOLN
2.0000 g | INTRAVENOUS | Status: AC
  Administered 2022-10-02: 2 g via INTRAVENOUS

## 2022-10-02 MED ORDER — DEXAMETHASONE SODIUM PHOSPHATE 10 MG/ML IJ SOLN
INTRAMUSCULAR | Status: AC
  Filled 2022-10-02: qty 1

## 2022-10-02 MED ORDER — ONDANSETRON HCL 4 MG/2ML IJ SOLN
INTRAMUSCULAR | Status: AC
  Filled 2022-10-02: qty 2

## 2022-10-02 MED ORDER — ONDANSETRON HCL 4 MG/2ML IJ SOLN
INTRAMUSCULAR | Status: DC | PRN
  Administered 2022-10-02: 4 mg via INTRAVENOUS

## 2022-10-02 MED ORDER — AMISULPRIDE (ANTIEMETIC) 5 MG/2ML IV SOLN: 10.0000 mg | Freq: Once | INTRAVENOUS | Status: DC | PRN

## 2022-10-02 MED ORDER — CEFAZOLIN SODIUM-DEXTROSE 2-4 GM/100ML-% IV SOLN
INTRAVENOUS | Status: AC
  Filled 2022-10-02: qty 100

## 2022-10-02 MED ORDER — BUPIVACAINE HCL (PF) 0.25 % IJ SOLN
INTRAMUSCULAR | Status: DC | PRN
  Administered 2022-10-02: 3 mL

## 2022-10-02 MED ORDER — MIDAZOLAM HCL 2 MG/2ML IJ SOLN
INTRAMUSCULAR | Status: AC
  Filled 2022-10-02: qty 2

## 2022-10-02 MED ORDER — ACETAMINOPHEN 500 MG PO TABS: 1000.0000 mg | ORAL_TABLET | ORAL | Status: DC

## 2022-10-02 MED ORDER — LIDOCAINE HCL (CARDIAC) PF 100 MG/5ML IV SOSY
PREFILLED_SYRINGE | INTRAVENOUS | Status: DC | PRN
  Administered 2022-10-02: 40 mg via INTRAVENOUS

## 2022-10-02 MED ORDER — FENTANYL CITRATE (PF) 100 MCG/2ML IJ SOLN
INTRAMUSCULAR | Status: DC | PRN
  Administered 2022-10-02 (×2): 25 ug via INTRAVENOUS

## 2022-10-02 MED ORDER — EPHEDRINE 5 MG/ML INJ
INTRAVENOUS | Status: AC
  Filled 2022-10-02: qty 5

## 2022-10-02 MED ORDER — MAGTRACE LYMPHATIC TRACER
INTRAMUSCULAR | Status: DC | PRN
  Administered 2022-10-02: 2 mL via INTRAMUSCULAR

## 2022-10-02 MED ORDER — DEXAMETHASONE SODIUM PHOSPHATE 10 MG/ML IJ SOLN
INTRAMUSCULAR | Status: DC | PRN
  Administered 2022-10-02: 5 mg via INTRAVENOUS

## 2022-10-02 MED ORDER — EPHEDRINE SULFATE (PRESSORS) 50 MG/ML IJ SOLN
INTRAMUSCULAR | Status: DC | PRN
  Administered 2022-10-02 (×2): 5 mg via INTRAVENOUS

## 2022-10-02 MED ORDER — PROPOFOL 500 MG/50ML IV EMUL
INTRAVENOUS | Status: DC | PRN
  Administered 2022-10-02: 150 ug/kg/min via INTRAVENOUS
  Administered 2022-10-02: 175 ug/kg/min via INTRAVENOUS

## 2022-10-02 MED ORDER — PROPOFOL 10 MG/ML IV BOLUS
INTRAVENOUS | Status: DC | PRN
  Administered 2022-10-02: 150 mg via INTRAVENOUS

## 2022-10-02 SURGICAL SUPPLY — 61 items
ADH SKN CLS APL DERMABOND .7 (GAUZE/BANDAGES/DRESSINGS) ×6
APL PRP STRL LF DISP 70% ISPRP (MISCELLANEOUS) ×2
APPLIER CLIP 9.375 MED OPEN (MISCELLANEOUS) ×2
APR CLP MED 9.3 20 MLT OPN (MISCELLANEOUS) ×2
BINDER BREAST LRG (GAUZE/BANDAGES/DRESSINGS) IMPLANT
BINDER BREAST MEDIUM (GAUZE/BANDAGES/DRESSINGS) IMPLANT
BINDER BREAST XLRG (GAUZE/BANDAGES/DRESSINGS) IMPLANT
BINDER BREAST XXLRG (GAUZE/BANDAGES/DRESSINGS) IMPLANT
BLADE SURG 15 STRL LF DISP TIS (BLADE) ×2 IMPLANT
BLADE SURG 15 STRL SS (BLADE) ×2
CANISTER SUC SOCK COL 7IN (MISCELLANEOUS) IMPLANT
CANISTER SUCT 1200ML W/VALVE (MISCELLANEOUS) IMPLANT
CHLORAPREP W/TINT 26 (MISCELLANEOUS) ×2 IMPLANT
CLIP APPLIE 9.375 MED OPEN (MISCELLANEOUS) IMPLANT
CLIP TI WIDE RED SMALL 6 (CLIP) ×2 IMPLANT
COVER BACK TABLE 60X90IN (DRAPES) ×2 IMPLANT
COVER MAYO STAND STRL (DRAPES) ×2 IMPLANT
COVER PROBE CYLINDRICAL 5X96 (MISCELLANEOUS) ×2 IMPLANT
DERMABOND ADVANCED .7 DNX12 (GAUZE/BANDAGES/DRESSINGS) ×2 IMPLANT
DRAPE LAPAROSCOPIC ABDOMINAL (DRAPES) ×2 IMPLANT
DRAPE UTILITY XL STRL (DRAPES) ×2 IMPLANT
DRSG TEGADERM 4X4.75 (GAUZE/BANDAGES/DRESSINGS) IMPLANT
ELECT COATED BLADE 2.86 ST (ELECTRODE) ×2 IMPLANT
ELECT REM PT RETURN 9FT ADLT (ELECTROSURGICAL) ×2
ELECTRODE REM PT RTRN 9FT ADLT (ELECTROSURGICAL) ×2 IMPLANT
GAUZE SPONGE 4X4 12PLY STRL LF (GAUZE/BANDAGES/DRESSINGS) IMPLANT
GLOVE BIO SURGEON STRL SZ7 (GLOVE) ×4 IMPLANT
GLOVE BIOGEL PI IND STRL 6.5 (GLOVE) IMPLANT
GLOVE BIOGEL PI IND STRL 7.0 (GLOVE) IMPLANT
GLOVE BIOGEL PI IND STRL 7.5 (GLOVE) ×2 IMPLANT
GLOVE ECLIPSE 6.5 STRL STRAW (GLOVE) IMPLANT
GOWN STRL REUS W/ TWL LRG LVL3 (GOWN DISPOSABLE) ×4 IMPLANT
GOWN STRL REUS W/TWL LRG LVL3 (GOWN DISPOSABLE) ×6
HEMOSTAT ARISTA ABSORB 3G PWDR (HEMOSTASIS) IMPLANT
KIT MARKER MARGIN INK (KITS) ×2 IMPLANT
NDL HYPO 25X1 1.5 SAFETY (NEEDLE) ×2 IMPLANT
NDL SAFETY ECLIP 18X1.5 (MISCELLANEOUS) IMPLANT
NEEDLE HYPO 25X1 1.5 SAFETY (NEEDLE) ×2 IMPLANT
NS IRRIG 1000ML POUR BTL (IV SOLUTION) IMPLANT
PACK BASIN DAY SURGERY FS (CUSTOM PROCEDURE TRAY) ×2 IMPLANT
PENCIL SMOKE EVACUATOR (MISCELLANEOUS) ×2 IMPLANT
RETRACTOR ONETRAX LX 90X20 (MISCELLANEOUS) IMPLANT
SLEEVE SCD COMPRESS KNEE MED (STOCKING) ×2 IMPLANT
SPIKE FLUID TRANSFER (MISCELLANEOUS) IMPLANT
SPONGE T-LAP 4X18 ~~LOC~~+RFID (SPONGE) ×2 IMPLANT
STRIP CLOSURE SKIN 1/2X4 (GAUZE/BANDAGES/DRESSINGS) ×2 IMPLANT
SUT ETHILON 2 0 FS 18 (SUTURE) IMPLANT
SUT MNCRL AB 4-0 PS2 18 (SUTURE) ×2 IMPLANT
SUT MON AB 5-0 PS2 18 (SUTURE) IMPLANT
SUT SILK 2 0 SH (SUTURE) IMPLANT
SUT VIC AB 2-0 SH 27 (SUTURE) ×8
SUT VIC AB 2-0 SH 27XBRD (SUTURE) ×2 IMPLANT
SUT VIC AB 3-0 SH 27 (SUTURE) ×4
SUT VIC AB 3-0 SH 27X BRD (SUTURE) ×2 IMPLANT
SUT VIC AB 5-0 PS2 18 (SUTURE) IMPLANT
SYR CONTROL 10ML LL (SYRINGE) ×2 IMPLANT
TOWEL GREEN STERILE FF (TOWEL DISPOSABLE) ×2 IMPLANT
TRACER MAGTRACE VIAL (MISCELLANEOUS) IMPLANT
TRAY FAXITRON CT DISP (TRAY / TRAY PROCEDURE) ×2 IMPLANT
TUBE CONNECTING 20X1/4 (TUBING) IMPLANT
YANKAUER SUCT BULB TIP NO VENT (SUCTIONS) IMPLANT

## 2022-10-02 NOTE — Progress Notes (Signed)
HPI:   [name] was previously seen in the [facility] clinic due to a personal and family history of cancer and concerns regarding a hereditary predisposition to cancer. Please refer to our prior cancer genetics clinic note for more information regarding our discussion, assessment and recommendations, at the time. [name] recent genetic test results were disclosed to her, as were recommendations warranted by these results. These results and recommendations are discussed in more detail below.  CANCER HISTORY:  Oncology History  Malignant neoplasm of upper-outer quadrant of right breast in female, estrogen receptor positive  09/17/2022 Initial Diagnosis   Malignant neoplasm of upper-outer quadrant of right breast in female, estrogen receptor positive ([place])   09/17/2022 Cancer Staging   Staging form: Breast, AJCC 8th Edition - Clinical stage from 09/17/2022: Stage IA (cT1c, cN0, cM0, G2, ER+, PR+, HER2-) - Signed by Sindy Guadeloupe, MD on 09/17/2022 Histologic grading system: 3 grade system    Genetic Testing   Negative genetic testing. No pathogenic variants identified on the [facility] Multi-Cancer+RNA panel. The report date is 09/28/2022.  The Multi-Cancer + RNA Panel offered by [facility] includes sequencing and/or deletion/duplication analysis of the following 70 genes:  AIP*, ALK, APC*, ATM*, AXIN2*, BAP1*, BARD1*, BLM*, BMPR1A*, BRCA1*, BRCA2*, BRIP1*, CDC73*, CDH1*, CDK4, CDKN1B*, CDKN2A, CHEK2*, CTNNA1*, DICER1*, EPCAM, EGFR, FH*, FLCN*, GREM1, HOXB13, KIT, LZTR1, MAX*, MBD4, MEN1*, MET, MITF, MLH1*, MSH2*, MSH3*, MSH6*, MUTYH*, NF1*, NF2*, NTHL1*, PALB2*, PDGFRA, PMS2*, POLD1*, POLE*, POT1*, PRKAR1A*, PTCH1*, PTEN*, RAD51C*, RAD51D*, RB1*, RET, SDHA*, SDHAF2*, SDHB*, SDHC*, SDHD*, SMAD4*, SMARCA4*, SMARCB1*, SMARCE1*, STK11*, SUFU*, TMEM127*, TP53*, TSC1*, TSC2*, VHL*. RNA analysis is performed for * genes.     FAMILY HISTORY:  We obtained a detailed, 4-generation family  history.  Significant diagnoses are listed below: Family History  Problem Relation Age of Onset   Hypertension Mother    COPD Mother    Heart disease Mother    Breast cancer Mother 16   Heart disease Father    COPD Father    Brain cancer Sister 7       tumor   Lung cancer Brother 32   Colon cancer Paternal Aunt        d. 91s   Juvenile Diabetes Son    [name] has 1 adopted daughter, Martha Chung, and 4 biological sons, no cancers. [name] had 1 sister who died at 66 of a brain tumor. She had 1 brother that died of lung cancer at 93.   [name] mother had breast cancer at 47 and passed at 51. Limited information about this side of the family.   [name] father died at 65 of COPD. Patient had 3 paternal aunts and 1 uncle. One aunt had colon cancer in her 49s.    [name] is unaware of previous family history of genetic testing for hereditary cancer risks. There is no reported Ashkenazi Jewish ancestry. There is no known consanguinity.      GENETIC TEST RESULTS:  The [facility] Multi-Cancer+RNA Panel found no pathogenic mutations.   The Multi-Cancer + RNA Panel offered by [facility] includes sequencing and/or deletion/duplication analysis of the following 70 genes:  AIP*, ALK, APC*, ATM*, AXIN2*, BAP1*, BARD1*, BLM*, BMPR1A*, BRCA1*, BRCA2*, BRIP1*, CDC73*, CDH1*, CDK4, CDKN1B*, CDKN2A, CHEK2*, CTNNA1*, DICER1*, EPCAM, EGFR, FH*, FLCN*, GREM1, HOXB13, KIT, LZTR1, MAX*, MBD4, MEN1*, MET, MITF, MLH1*, MSH2*, MSH3*, MSH6*, MUTYH*, NF1*, NF2*, NTHL1*, PALB2*, PDGFRA, PMS2*, POLD1*, POLE*, POT1*, PRKAR1A*, PTCH1*, PTEN*, RAD51C*, RAD51D*, RB1*, RET, SDHA*, SDHAF2*, SDHB*, SDHC*, SDHD*, SMAD4*,  SMARCA4*, SMARCB1*, SMARCE1*, STK11*, SUFU*, TMEM127*, TP53*, TSC1*, TSC2*, VHL*. RNA analysis is performed for * genes.   The test report has been scanned into EPIC and is located under the Molecular Pathology section of the Results Review tab.  A portion of the result report is included below for  reference. Genetic testing reported out on 09/28/2022.      Even though a pathogenic variant was not identified, possible explanations for the cancer in the family may include: There may be no hereditary risk for cancer in the family. The cancers in [name] and/or her family may be sporadic/familial or due to other genetic and environmental factors. There may be a gene mutation in one of these genes that current testing methods cannot detect but that chance is small. There could be another gene that has not yet been discovered, or that we have not yet tested, that is responsible for the cancer diagnoses in the family.  It is also possible there is a hereditary cause for the cancer in the family that [name] did not inherit.  Therefore, it is important to remain in touch with cancer genetics in the future so that we can continue to offer [name] the most up to date genetic testing.   ADDITIONAL GENETIC TESTING:  We discussed with [name] that her genetic testing was fairly extensive.  If there are additional relevant genes identified to increase cancer risk that can be analyzed in the future, we would be happy to discuss and coordinate this testing at that time.    CANCER SCREENING RECOMMENDATIONS:  [name] test result is considered negative (normal).  This means that we have not identified a hereditary cause for her personal and family history of cancer at this time.   An individual's cancer risk and medical management are not determined by genetic test results alone. Overall cancer risk assessment incorporates additional factors, including personal medical history, family history, and any available genetic information that may result in a personalized plan for cancer prevention and surveillance. Therefore, it is recommended she continue to follow the cancer management and screening guidelines provided by her oncology and primary healthcare provider.  RECOMMENDATIONS FOR FAMILY  MEMBERS:   Since she did not inherit a identifiable mutation in a cancer predisposition gene included on this panel, her children could not have inherited a known mutation from her in one of these genes. Individuals in this family might be at some increased risk of developing cancer, over the general population risk, due to the family history of cancer.  Individuals in the family should notify their providers of the family history of cancer. We recommend women in this family have a yearly mammogram beginning at age 10, or 8 years younger than the earliest onset of cancer, an annual clinical breast exam, and perform monthly breast self-exams.  Family members should have colonoscopies by at age 50, or earlier, as recommended by their providers.  FOLLOW-UP:  Lastly, we discussed with [name] that cancer genetics is a rapidly advancing field and it is possible that new genetic tests will be appropriate for her and/or her family members in the future. We encouraged her to remain in contact with cancer genetics on an annual basis so we can update her personal and family histories and let her know of advances in cancer genetics that may benefit this family.   Our contact number was provided. [name] questions were answered to her satisfaction, and she knows she is welcome to call  us at anytime with additional questions or concerns.    Faith Rogue, MS, [facility] Genetic Counselor [email or web address] Phone: [phone]

## 2022-10-02 NOTE — H&P (Signed)
82 year old female with multiple medical problems including heart failure for which she last was seen in 2022 by the chart and hypertension. She has a history of a benign breast biopsy in the 1970s. She had her last mammogram 6 years ago is when she lived in [place] and they told her they were in a stop doing mammograms on her at age 16. She palpated a right breast mass. She then underwent evaluation. She has B density breast tissue. In the right breast there is a 2 cm outer right breast mass that on ultrasound measures 1.6 cm. Her axilla is negative by ultrasound. 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 She also had a 5 mm outer left breast mass that measures 4 x 3 x 3 mm on ultrasound. The axilla is negative on that side this is a grade 1 invasive mammary carcinoma that is greater than 90% ER/PR positive and HER2 negative. She has been seen by oncology. She is here with her daughter to discuss her options. The inverted nipple on the left side is longstanding  Review of Systems: A complete review of systems was obtained from the patient. I have reviewed this information and discussed as appropriate with the patient. See HPI as well for other ROS.  Review of Systems Respiratory: Positive for sputum production. Gastrointestinal: Positive for diarrhea. Neurological: Positive for headaches. Endo/Heme/Allergies: Bruises/bleeds easily. All other systems reviewed and are negative.   Medical History: Past Medical History: Diagnosis Date CHF (congestive heart failure) (CMS-HCC) Chicken pox COPD (chronic obstructive pulmonary disease) (CMS-HCC) GERD (gastroesophageal reflux disease) Gout, joint Heart attack (CMS-HCC) Heart disease Hx CHF x1 Heart failure (CMS-HCC) History of cancer Hypertension Hypothyroid Hypothyroidism Lichen sclerosus Numbness of fingers 09/23/2019 Osteoarthritis Type 2 diabetes mellitus without  complication, without long-term current use of insulin (CMS-HCC) 02/20/2022  Patient Active Problem List Diagnosis Osteopenia Other postoperative complication involving digestive system Diverticulitis of large intestine without perforation or abscess without bleeding Numbness of fingers Onychomycosis Onychogryphosis Contracture of joint of foot Acquired deformities of toe Degenerative joint disease of ankle and foot, left Degenerative joint disease of ankle and foot, right Dizziness Exertional dyspnea Abnormal cardiovascular stress test Easy fatigability Type 2 diabetes mellitus with stage 3a chronic kidney disease, without long-term current use of insulin (CMS-HCC) Acquired hypothyroidism Sjogren's syndrome with keratoconjunctivitis sicca (CMS-HCC) Adjustment disorder with mixed anxiety and depressed mood Arthritis Chronic diastolic CHF (congestive heart failure) (CMS-HCC) Chronic stable angina COPD (chronic obstructive pulmonary disease) (CMS-HCC) Diverticulosis of intestine, part unspecified, without perforation or abscess without bleeding Essential (primary) hypertension 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, unspecified Gastro-esophageal reflux disease without esophagitis Glucose intolerance (impaired glucose tolerance) Gout Leukocytosis SBO (small bowel obstruction) (CMS-HCC)  Past Surgical History: Procedure Laterality Date HYSTERECTOMY 1968 Kidney stone removal [place] Bilateral 2002 [place] s/p PCIOL LENS EYE SURGERY Bilateral 2003 s/p YAG CAP in [place] Bilateral hammer toe repair 2009 CATARACT EXTRACTION 2010 Abdomial surgery APPENDECTOMY COLOSTOMY COLOSTOMY Ruptured colon with colostomy and reconnection JOINT REPLACEMENT Bilateral Knee KNEE ARTHROSCOPY Bilat knees 2004/2010 TONSILLECTOMY TUBAL LIGATION   Allergies Allergen Reactions Iodinated Contrast Media Anaphylaxis Ioxaglate Sodium Anaphylaxis Topical betadine is  ok. Morphine Sulfate Anaphylaxis and Shortness Of Breath Shellfish Containing Products Anaphylaxis and Swelling Dilaudid [Hydromorphone] Other (See Comments) Macrobid [Nitrofurantoin Monohyd/M-Cryst] Other (See Comments)  Current Outpatient Medications on File Prior to Visit Medication Sig Dispense Refill allopurinoL (ZYLOPRIM) 100 MG tablet Take 1 tablet (100 mg total) by  mouth once daily for 360 days 90 tablet 3 calcium carbonate-vitamin D3 (CALTRATE 600+D) 600 mg(1,500mg ) -200 unit tablet Take by mouth diazePAM (VALIUM) 5 MG tablet Take 1 tablet (5 mg) 1 hour prior to MRI, may take second dose just before MRI if needed. 2 tablet 0 diphenoxylate-atropine (LOMOTIL) 2.5-0.025 mg tablet Take 1 tablet by mouth 3 (three) times daily as needed for Diarrhea 30 tablet 0 famotidine (PEPCID) 20 MG tablet Take 20 mg by mouth 2 (two) times daily. fluticasone propionate (FLONASE) 50 mcg/actuation nasal spray Place 2 sprays into both nostrils once daily 16 g 11 FUROsemide (LASIX) 20 MG tablet TAKE 1 TABLET BY MOUTH ONCE DAILY AND TAKE 1 EXTRA TABLET BY MOUTH AS NEEDED 60 tablet 9 ipratropium (ATROVENT) 21 mcg (0.03 %) nasal spray Place 2 sprays into both nostrils 2 (two) times daily 30 mL 11 levothyroxine (SYNTHROID) 88 MCG tablet Take 1 tablet (88 mcg total) by mouth once daily Take on an empty stomach with a glass of water at least 30-60 minutes before breakfast. 90 tablet 2 meloxicam (MOBIC) 7.5 MG tablet Take 1 tablet (7.5 mg total) by mouth 2 (two) times daily as needed for Pain 30 tablet 11 metoprolol tartrate (LOPRESSOR) 50 MG tablet Take 1 tablet (50 mg total) by mouth 2 (two) times daily 180 tablet 3 multivitamin tablet Take 1 tablet by mouth once daily ONETOUCH DELICA PLUS LANCET USE TO CHECK BLOOD SUGAR ONCE DAILY 100 each 11 ONETOUCH ULTRA TEST test strip USE TO CHECK BLOOD SUGAR ONCE DAILY 100 each 11 potassium 99 mg Tab Take 1 tablet by mouth once daily blood glucose meter kit as directed 1  each 0 lancing device with lancets kit Use 1 each once daily 8 each 12  Family History Problem Relation Age of Onset Heart disease Mother Breast cancer Mother Deceased High blood pressure (Hypertension) Mother Osteoarthritis Mother Heart disease Father COPD Father Deceased Kidney disease Son Deceased Lung cancer Brother Deceased Glaucoma Neg Hx Macular degeneration Neg Hx   Social History  Tobacco Use Smoking Status Former Packs/day: 1.00 Years: 10.00 Additional pack years: 0.00 Total pack years: 10.00 Types: Cigarettes Quit date: 02/15/1999 Years since quitting: 23.6 Smokeless Tobacco Never Marital status: Single Tobacco Use Smoking status: Former Packs/day: 1.00 Years: 10.00 Additional pack years: 0.00 Total pack years: 10.00 Types: Cigarettes Quit date: 02/15/1999 Years since quitting: 23.6 Smokeless tobacco: Never Vaping Use Vaping Use: Never used Substance and Sexual Activity Alcohol use: No Drug use: Never Sexual activity: Not Currently Partners: Male Birth control/protection: Condom, Post-menopausal Social History Narrative 02/20/2022 Likes/Enjoys/What fills your day?: Military Service: None Driving Status: Reports driving, independently Home: One Story Your Bedrooms is on: First Level Fewest Steps to enter the home: 0 Other persons in the home: lives alone Pets: Higher education careers adviser you use daily: Blood Pressure Regulatory affairs officer available in the home: [place], Single point, Rollator, and Environmental consultant Dental: Dentures, Complete. Vision: Wears reading glasses. Last screening Date: February 2023. Screening is: Up to date, Dr. Truman Hayward - [facility] Hearing: Denies any issues in Hearing . Dermatology: Denies areas of concern.   Objective:  Vitals: 09/19/22 0801 BP: (!) 168/72 Pulse: 88 Weight: 95.7 kg (211 lb) Height: 152.4 cm (5')  Body mass index is 41.21 kg/m.  Physical Exam Vitals reviewed. Constitutional: Appearance:  Normal appearance. Cardiovascular: Rate and Rhythm: Normal rate. Pulmonary: Effort: Pulmonary effort is normal. Chest: Breasts: Right: Mass present. No inverted nipple or nipple discharge. Left: Inverted nipple (longstanding after prior surgery in 1970s) present.  No mass or nipple discharge. Comments: Mass with some hematoma ruoq Lymphadenopathy: Upper Body: Right upper body: No supraclavicular or axillary adenopathy. Left upper body: No supraclavicular or axillary adenopathy. Neurological: Mental Status: She is alert.    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 We discussed all of the different options for treatment for breast cancer including surgery, chemotherapy, radiation therapy, Herceptin, and antiestrogen therapy.  We discussed a sentinel lymph node biopsy as she does not appear to having lymph node involvement right now. This is debatable on right side but she does have a 2 cm tumor that is grade II. Will do on right side. I do not think she needs one on left side. We discussed the performance of that with injection of magtrace. Small risk of skin discoloration. We discussed that there is a chance of having a positive node with a sentinel lymph node biopsy and we will await the permanent pathology to make any other first further decisions in terms of her treatment. We discussed up to a 5% risk lifetime of chronic shoulder pain as well as lymphedema associated with a sentinel lymph node biopsy.I will have her see PT and do sozo prior to surgery as well. Gave rx for postop bra also.  We discussed the options for treatment of the breast cancer which included lumpectomy versus a mastectomy. We discussed the performance of the  lumpectomy with radioactive seed placement. We discussed a 5-10% chance of a positive margin requiring reexcision in the operating room. We also discussed that she might be recommended radiation therapy if she undergoes lumpectomy. We discussed likely hypofractionation would be reasonable. We also discussed at 60 depending on final pathology she may be candidate for ultrahypo or even partial breast. Discussed could send to [facility] for consult after surgery to see if those are possibilities. Or she could even forgo radiation pending final pathology. We discussed mastectomy and the postoperative care for that as well. Mastectomy can be followed by reconstruction. The decision for lumpectomy vs mastectomy has no impact on decision for chemotherapy. Most mastectomy patients will not need radiation therapy. We discussed that there is no difference in her survival whether she undergoes lumpectomy with radiation therapy or antiestrogen therapy versus a mastectomy. There is also no real difference between her recurrence in the breast.  We discussed the risks of operation including bleeding, infection, possible reoperation. She understands her further therapy will be based on what her stages at the time of her operation.

## 2022-10-02 NOTE — Interval H&P Note (Signed)
History and Physical Interval Note:  10/02/2022 1:22 PM  Martha Chung  has presented today for surgery, with the diagnosis of BILATERAL BREAST CANCER.  The various methods of treatment have been discussed with the patient and family. After consideration of risks, benefits and other options for treatment, the patient has consented to  Procedure(s): RIGHT BREAST LUMPECTOMY WITH RADIOACTIVE SEED AND RIGHT AXILLARY SENTINEL LYMPH NODE BIOPSY (Right) LEFT BREAST LUMPECTOMY WITH RADIOACTIVE SEED LOCALIZATION (Left) as a surgical intervention.  The patient's history has been reviewed, patient examined, no change in status, stable for surgery.  I have reviewed the patient's chart and labs.  Questions were answered to the patient's satisfaction.     Rolm Bookbinder

## 2022-10-02 NOTE — Telephone Encounter (Signed)
I contacted Ms. Jim to discuss her genetic testing results. No pathogenic variants were identified in the 70 genes analyzed. Detailed clinic note to follow.   The test report has been scanned into EPIC and is located under the Molecular Pathology section of the Results Review tab.  A portion of the result report is included below for reference.      Faith Rogue, MS, [facility] Genetic Counselor [email or web address] Phone: [phone]

## 2022-10-02 NOTE — Anesthesia Procedure Notes (Signed)
Procedure Name: LMA Insertion Date/Time: 10/02/2022 1:59 PM  Performed by: Lavonia Dana, CRNAPre-anesthesia Checklist: Patient identified, Emergency Drugs available, Suction available and Patient being monitored Patient Re-evaluated:Patient Re-evaluated prior to induction Oxygen Delivery Method: Circle system utilized Preoxygenation: Pre-oxygenation with 100% oxygen Induction Type: IV induction Ventilation: Mask ventilation without difficulty LMA: LMA with gastric port inserted LMA Size: 4.0 Number of attempts: 1 Airway Equipment and Method: Bite block Placement Confirmation: positive ETCO2 Tube secured with: Tape Dental Injury: Teeth and Oropharynx as per pre-operative assessment

## 2022-10-02 NOTE — Anesthesia Preprocedure Evaluation (Addendum)
Anesthesia Evaluation  Patient identified by MRN, date of birth, ID band Patient awake    Reviewed: Allergy & Precautions, NPO status , Patient's Chart, lab work & pertinent test results  Airway Mallampati: II  TM Distance: >3 FB Neck ROM: Full    Dental  (+) Lower Dentures, Upper Dentures   Pulmonary COPD, former smoker   Pulmonary exam normal        Cardiovascular hypertension, Pt. on home beta blockers +CHF  Normal cardiovascular exam     Neuro/Psych  PSYCHIATRIC DISORDERS      negative neurological ROS     GI/Hepatic Neg liver ROS,GERD  Medicated and Controlled,,  Endo/Other  diabetesHypothyroidism  Morbid obesity  Renal/GU Renal disease     Musculoskeletal  (+) Arthritis ,    Abdominal  (+) + obese  Peds  Hematology negative hematology ROS (+)   Anesthesia Other Findings BILATERAL BREAST CANCER  Reproductive/Obstetrics                             Anesthesia Physical Anesthesia Plan  ASA: 3  Anesthesia Plan: General and Regional   Post-op Pain Management: Regional block*   Induction: Intravenous  PONV Risk Score and Plan: 3 and Ondansetron, Dexamethasone and Treatment may vary due to age or medical condition  Airway Management Planned: LMA  Additional Equipment:   Intra-op Plan:   Post-operative Plan: Extubation in OR  Informed Consent: I have reviewed the patients History and Physical, chart, labs and discussed the procedure including the risks, benefits and alternatives for the proposed anesthesia with the patient or authorized representative who has indicated his/her understanding and acceptance.       Plan Discussed with: CRNA  Anesthesia Plan Comments:        Anesthesia Quick Evaluation

## 2022-10-02 NOTE — Discharge Instructions (Addendum)
[facility] Office Phone Number [phone]  POST OP INSTRUCTIONS Take 400 mg of ibuprofen every 8 hours or 650 mg tylenol every 6 hours for next 72 hours then as needed. Use ice several times daily also.  A prescription for pain medication may be given to you upon discharge.  Take your pain medication as prescribed, if needed.  If narcotic pain medicine is not needed, then you may take acetaminophen (Tylenol), naprosyn (Alleve) or ibuprofen (Advil) as needed. Take your usually prescribed medications unless otherwise directed If you need a refill on your pain medication, please contact your pharmacy.  They will contact our office to request authorization.  Prescriptions will not be filled after 5pm or on week-ends. You should eat very light the first 24 hours after surgery, such as soup, crackers, pudding, etc.  Resume your normal diet the day after surgery. Most patients will experience some swelling and bruising in the breast.  Ice packs and a good support bra will help.  Wear the breast binder provided or a sports bra for 72 hours day and night.  After that wear a sports bra during the day until you return to the office. Swelling and bruising can take several days to resolve.  It is common to experience some constipation if taking pain medication after surgery.  Increasing fluid intake and taking a stool softener will usually help or prevent this problem from occurring.  A mild laxative (Milk of Magnesia or Miralax) should be taken according to package directions if there are no bowel movements after 48 hours. I used skin glue on the incision, you may shower in 24 hours.  The glue will flake off over the next 2-3 weeks.  Any sutures or staples will be removed at the office during your follow-up visit. ACTIVITIES:  You may resume regular daily activities (gradually increasing) beginning the next day.  Wearing a good support bra or sports bra minimizes pain and swelling.  You may have  sexual intercourse when it is comfortable. You may drive when you no longer are taking prescription pain medication, you can comfortably wear a seatbelt, and you can safely maneuver your car and apply brakes. RETURN TO WORK:  ______________________________________________________________________________________ You should see your doctor in the office for a follow-up appointment approximately two weeks after your surgery.  Your doctor's nurse will typically make your follow-up appointment when she calls you with your pathology report.  Expect your pathology report 3-4 business days after your surgery.  You may call to check if you do not hear from us after three days. OTHER INSTRUCTIONS: _______________________________________________________________________________________________ _____________________________________________________________________________________________________________________________________ _____________________________________________________________________________________________________________________________________ _____________________________________________________________________________________________________________________________________  WHEN TO CALL DR [place]KEFIELD: Fever over 101.0 Nausea and/or vomiting. Extreme swelling or bruising. Continued bleeding from incision. Increased pain, redness, or drainage from the incision.  The clinic staff is available to answer your questions during regular business hours.  Please don't hesitate to call and ask to speak to one of the nurses for clinical concerns.  If you have a medical emergency, go to the nearest emergency room or call 911.  A surgeon from [facility] is always on call at the hospital.  For further questions, please visit [email or web address]      Post Anesthesia Home Care Instructions  Activity: Get plenty of rest for the remainder of the day. A responsible individual must  stay with you for 24 hours following the procedure.  For the next 24 hours, DO NOT: -Drive a car -Operate machinery -Drink alcoholic beverages -Take any medication   unless instructed by your physician -Make any legal decisions or sign important papers.  Meals: Start with liquid foods such as gelatin or soup. Progress to regular foods as tolerated. Avoid greasy, spicy, heavy foods. If nausea and/or vomiting occur, drink only clear liquids until the nausea and/or vomiting subsides. Call your physician if vomiting continues.  Special Instructions/Symptoms: Your throat may feel dry or sore from the anesthesia or the breathing tube placed in your throat during surgery. If this causes discomfort, gargle with warm salt water. The discomfort should disappear within 24 hours.  If you had a scopolamine patch placed behind your ear for the management of post- operative nausea and/or vomiting:  1. The medication in the patch is effective for 72 hours, after which it should be removed.  Wrap patch in a tissue and discard in the trash. Wash hands thoroughly with soap and water. 2. You may remove the patch earlier than 72 hours if you experience unpleasant side effects which may include dry mouth, dizziness or visual disturbances. 3. Avoid touching the patch. Wash your hands with soap and water after contact with the patch.    

## 2022-10-02 NOTE — Transfer of Care (Signed)
Immediate Anesthesia Transfer of Care Note  Patient: Martha Chung  Procedure(s) Performed: RIGHT BREAST LUMPECTOMY WITH RADIOACTIVE SEED AND RIGHT AXILLARY SENTINEL LYMPH NODE BIOPSY (Right: Breast) LEFT BREAST LUMPECTOMY WITH RADIOACTIVE SEED LOCALIZATION (Left: Breast)  Patient Location: PACU  Anesthesia Type:GA combined with regional for post-op pain  Level of Consciousness: drowsy  Airway & Oxygen Therapy: Patient Spontanous Breathing and Patient connected to face mask oxygen  Post-op Assessment: Report given to RN and Post -op Vital signs reviewed and stable  Post vital signs: Reviewed and stable  Last Vitals:  Vitals Value Taken Time  BP 129/66 (85)   Temp    Pulse 74 10/02/22 1530  Resp 18 10/02/22 1530  SpO2 95 % 10/02/22 1530  Vitals shown include unvalidated device data.  Last Pain:  Vitals:   10/02/22 1209  TempSrc: Oral  PainSc: 0-No pain      Patients Stated Pain Goal: 4 ([phone] [phone])  Complications: No notable events documented.

## 2022-10-02 NOTE — Anesthesia Procedure Notes (Signed)
Anesthesia Regional Block: Pectoralis block   Pre-Anesthetic Checklist: , timeout performed,  Correct Patient, Correct Site, Correct Laterality,  Correct Procedure, Correct Position, site marked,  Risks and benefits discussed,  Surgical consent,  Pre-op evaluation,  At surgeon's request and post-op pain management  Laterality: Right  Prep: chloraprep       Needles:  Injection technique: Single-shot  Needle Type: Echogenic Stimulator Needle     Needle Length: 10cm  Needle Gauge: 20     Additional Needles:   Procedures:,,,, ultrasound used (permanent image in chart),,    Narrative:  Start time: 10/02/2022 1:25 PM End time: 10/02/2022 1:35 PM Injection made incrementally with aspirations every 5 mL.  Performed by: Personally  Anesthesiologist: Murvin Natal, MD  Additional Notes: Functioning IV was confirmed and monitors were applied.  A timeout was performed. Sterile prep, hand hygiene and sterile gloves were used. A 127mm 20ga Bbraun echogenic stimulator needle was used. Negative aspiration and negative test dose prior to incremental administration of local anesthetic. The patient tolerated the procedure well.  Ultrasound guidance: relevent anatomy identified, needle position confirmed, local anesthetic spread visualized around nerve(s), vascular puncture avoided.  Image printed for medical record.

## 2022-10-02 NOTE — Progress Notes (Signed)
Assisted Dr. Ellender with right, pectoralis, ultrasound guided block. Side rails up, monitors on throughout procedure. See vital signs in flow sheet. Tolerated Procedure well. ?

## 2022-10-02 NOTE — Op Note (Signed)
Preoperative diagnosis: 1.  Clinical stage II right breast cancer 2.  Clinical stage I left breast cancer Postoperative diagnosis: Same as above Procedure: 1.  Left breast radioactive seed guided lumpectomy 2.  Injection of mag trace for sentinel lymph node identification 3.  Right breast radioactive seed guided lumpectomy 4.  Right deep axillary sentinel lymph node biopsy Surgeon: Dr. Serita Grammes Anesthesia: General with a right-sided pec block Estimated blood loss: 50 cc Drains: None Specimens: 1.  Left breast tissue marked with paint containing seed and clip 2.  Additional posterior tissue left breast marked short superior, long lateral, double deep 3.  Right breast tissue marked with paint containing seed and clip 4.  Additional posterior tissue right breast marked short superior, long lateral, double deep 5.  Right deep axillary sentinel lymph nodes Complications: None Sponge needle count was correct completion Disposition recovery stable addition  Indications: 82 year old female who palpated a right breast mass.She has B density breast tissue. In the right breast there is a 2 cm outer right breast mass that on ultrasound measures 1.6 cm. Her axilla is negative by ultrasound. 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 She also had a 5 mm outer left breast mass that measures 4 x 3 x 3 mm on ultrasound. The axilla is negative on that side this is a grade 1 invasive mammary carcinoma that is greater than 90% ER/PR positive and HER2 negative.  We discussed her options and elected proceed with a lumpectomy on the left and a lumpectomy sentinel node on the right side.  Procedure: After informed sent was obtained she first underwent a pectoral block on the right side.  She had bilateral seeds placed.  I these mammograms in the operating room.  She was then placed under general esthesia without complication.  She was  prepped and draped in standard sterile surgical fashion.  Surgical timeout was performed.  I first injected the mag trace on the right side.  I injected 2 cc of mag trace in the subareolar position.  This was massaged for about 5 minutes.  I did the left lumpectomy first.  I made a curvilinear incision overlying the radioactive seed.  I dissected down the seed.  I used the neoprobe for guidance and remove the seed in the surrounding tissue and attempt to get a clear margin.  Mammogram confirmed removal of the seed and the clip.  I did 3D imaging and thought I might be close to a posterior margin so I remove this.  This was marked as above.  I then obtained hemostasis.  I placed him clips in the cavity.  I closed this with 2-0 Vicryl, 3-0 Vicryl, and 4 Monocryl.  Glue and Steri-Strips were applied.  I then performed a right lumpectomy.  I made a curvilinear incision in the upper outer quadrant.  I then used the neoprobe again to locate the seed and remove the seed in the surrounding tissue with an attempt to get a clear margin.  Mammogram confirmed removal of the clip and the seed.  3D imaging showed that it look like I thought I would be close posteriorly again so I remove this.  Clips were placed in the cavity.  Hemostasis was obtained.  I closed down the breast tissue with 2-0 Vicryl.  The skin was closed with 3-0 Vicryl and 4 Monocryl.  I then made an incision right below the axillary hairline.  I carried this  to the axillary fascia into the axilla.  I then was able to identify several small nodes.  These had minimal activity but they did appear to be the sentinel nodes.  I then remove these.  There was no additional activity or any abnormal nodes present.  I obtained hemostasis.  I did place some Arista in the axilla.  I closed the axillary fascia with 2-0 Vicryl.  Skin was closed with 3-0 Vicryl and 4 Monocryl.  Glue insertion was applied.  She tolerated all this well was extubated transferred recovery  stable.

## 2022-10-03 ENCOUNTER — Encounter (HOSPITAL_BASED_OUTPATIENT_CLINIC_OR_DEPARTMENT_OTHER): Payer: Self-pay | Admitting: General Surgery

## 2022-10-03 ENCOUNTER — Inpatient Hospital Stay: Payer: Medicare HMO | Attending: Oncology | Admitting: *Deleted

## 2022-10-03 DIAGNOSIS — C50412 Malignant neoplasm of upper-outer quadrant of left female breast: Secondary | ICD-10-CM | POA: Insufficient documentation

## 2022-10-03 DIAGNOSIS — Z87891 Personal history of nicotine dependence: Secondary | ICD-10-CM | POA: Insufficient documentation

## 2022-10-03 DIAGNOSIS — Z79811 Long term (current) use of aromatase inhibitors: Secondary | ICD-10-CM | POA: Insufficient documentation

## 2022-10-03 DIAGNOSIS — Z79899 Other long term (current) drug therapy: Secondary | ICD-10-CM | POA: Insufficient documentation

## 2022-10-03 DIAGNOSIS — Z833 Family history of diabetes mellitus: Secondary | ICD-10-CM | POA: Insufficient documentation

## 2022-10-03 DIAGNOSIS — Z808 Family history of malignant neoplasm of other organs or systems: Secondary | ICD-10-CM | POA: Insufficient documentation

## 2022-10-03 DIAGNOSIS — Z825 Family history of asthma and other chronic lower respiratory diseases: Secondary | ICD-10-CM | POA: Insufficient documentation

## 2022-10-03 DIAGNOSIS — Z801 Family history of malignant neoplasm of trachea, bronchus and lung: Secondary | ICD-10-CM | POA: Insufficient documentation

## 2022-10-03 DIAGNOSIS — C50411 Malignant neoplasm of upper-outer quadrant of right female breast: Secondary | ICD-10-CM

## 2022-10-03 DIAGNOSIS — Z800 Family history of malignant neoplasm of digestive organs: Secondary | ICD-10-CM | POA: Insufficient documentation

## 2022-10-03 DIAGNOSIS — Z803 Family history of malignant neoplasm of breast: Secondary | ICD-10-CM | POA: Insufficient documentation

## 2022-10-03 DIAGNOSIS — Z9049 Acquired absence of other specified parts of digestive tract: Secondary | ICD-10-CM | POA: Insufficient documentation

## 2022-10-03 DIAGNOSIS — Z881 Allergy status to other antibiotic agents status: Secondary | ICD-10-CM | POA: Insufficient documentation

## 2022-10-03 DIAGNOSIS — Z8249 Family history of ischemic heart disease and other diseases of the circulatory system: Secondary | ICD-10-CM | POA: Insufficient documentation

## 2022-10-03 DIAGNOSIS — Z90710 Acquired absence of both cervix and uterus: Secondary | ICD-10-CM | POA: Insufficient documentation

## 2022-10-03 DIAGNOSIS — Z170 Estrogen receptor positive status [ER+]: Secondary | ICD-10-CM | POA: Insufficient documentation

## 2022-10-03 DIAGNOSIS — Z885 Allergy status to narcotic agent status: Secondary | ICD-10-CM | POA: Insufficient documentation

## 2022-10-03 NOTE — Progress Notes (Signed)
Multidisciplinary Oncology Council Documentation  Martha Chung was presented by our [facility] on 10/03/2022, which included representatives from:  Palliative Care Dietitian  Physical/Occupational Therapist Nurse Navigator Genetics Speech Therapist Social work Survivorship RN Financial Navigator Research RN   Martha Chung currently presents with history of breast cancer.  We reviewed previous medical and familial history, history of present illness, and recent lab results along with all available histopathologic and imaging studies. The Martha Chung considered available treatment options and made the following recommendations/referrals:  - social work  The [employer] is a Occupational psychologist from various specialty areas who evaluate and discuss patients for whom a multidisciplinary approach is being considered. Final determinations in the plan of care are those of the provider(s).   Today's extended care, comprehensive team conference, Martha Chung was not present for the discussion and was not examined.

## 2022-10-03 NOTE — Anesthesia Postprocedure Evaluation (Signed)
Anesthesia Post Note  Patient: Martha Chung  Procedure(s) Performed: RIGHT BREAST LUMPECTOMY WITH RADIOACTIVE SEED AND RIGHT AXILLARY SENTINEL LYMPH NODE BIOPSY (Right: Breast) LEFT BREAST LUMPECTOMY WITH RADIOACTIVE SEED LOCALIZATION (Left: Breast)     Patient location during evaluation: PACU Anesthesia Type: Regional and General Level of consciousness: awake Pain management: pain level controlled Vital Signs Assessment: post-procedure vital signs reviewed and stable Respiratory status: spontaneous breathing, nonlabored ventilation and respiratory function stable Cardiovascular status: blood pressure returned to baseline and stable Postop Assessment: no apparent nausea or vomiting Anesthetic complications: no   No notable events documented.  Last Vitals:  Vitals:   10/02/22 1600 10/02/22 1610  BP:  131/84  Pulse: 68 73  Resp: 17 18  Temp:  36.4 C  SpO2: 95% 96%    Last Pain:  Vitals:   10/02/22 1610  TempSrc:   PainSc: 0-No pain                 Demarquis Osley P Izzy [place]

## 2022-10-08 ENCOUNTER — Encounter: Payer: Self-pay | Admitting: *Deleted

## 2022-10-08 LAB — SURGICAL PATHOLOGY

## 2022-10-08 NOTE — Progress Notes (Signed)
Oncotype order submitted online on path [id number] specimen C, right breast

## 2022-10-19 ENCOUNTER — Encounter: Payer: Self-pay | Admitting: *Deleted

## 2022-10-19 ENCOUNTER — Encounter (HOSPITAL_COMMUNITY): Payer: Self-pay

## 2022-10-19 NOTE — Progress Notes (Signed)
Called Ms. Shirer to let her know her oncotype dx score was 16 and she will not need chemotherapy.   She states she is doing well since surgery.  She will see Dr. Smith Robert and Dr. Rushie Chestnut on Tuesday.

## 2022-10-22 ENCOUNTER — Encounter: Payer: Self-pay | Admitting: Licensed Clinical Social Worker

## 2022-10-22 NOTE — Progress Notes (Signed)
[facility] Clinical Social Work  Clinical Social Work was referred by medical provider for assessment of psychosocial needs.  Clinical Social Worker contacted patient by phone  to offer support and assess for needs.  CSW was unable to leave message voicemail full.    FA  Joseph Art, LCSW  Clinical Social Worker [facility]

## 2022-10-23 ENCOUNTER — Encounter: Payer: Self-pay | Admitting: Oncology

## 2022-10-23 ENCOUNTER — Encounter: Payer: Self-pay | Admitting: Radiation Oncology

## 2022-10-23 ENCOUNTER — Inpatient Hospital Stay (HOSPITAL_BASED_OUTPATIENT_CLINIC_OR_DEPARTMENT_OTHER): Payer: Medicare HMO | Admitting: Oncology

## 2022-10-23 ENCOUNTER — Ambulatory Visit
Admission: RE | Admit: 2022-10-23 | Discharge: 2022-10-23 | Disposition: A | Discharge status: Home / Self Care | Payer: Medicare HMO | Source: Ambulatory Visit | Attending: Radiation Oncology | Admitting: Radiation Oncology

## 2022-10-23 VITALS — BP 145/57 | HR 61 | Temp 97.9°F | Ht 60.0 in | Wt 209.0 lb

## 2022-10-23 VITALS — BP 170/77 | HR 67 | Temp 97.5°F | Resp 16 | Ht 60.0 in | Wt 209.0 lb

## 2022-10-23 DIAGNOSIS — C50412 Malignant neoplasm of upper-outer quadrant of left female breast: Secondary | ICD-10-CM | POA: Insufficient documentation

## 2022-10-23 DIAGNOSIS — Z170 Estrogen receptor positive status [ER+]: Secondary | ICD-10-CM | POA: Diagnosis not present

## 2022-10-23 DIAGNOSIS — E119 Type 2 diabetes mellitus without complications: Secondary | ICD-10-CM | POA: Diagnosis not present

## 2022-10-23 DIAGNOSIS — E039 Hypothyroidism, unspecified: Secondary | ICD-10-CM | POA: Diagnosis not present

## 2022-10-23 DIAGNOSIS — Z801 Family history of malignant neoplasm of trachea, bronchus and lung: Secondary | ICD-10-CM | POA: Diagnosis not present

## 2022-10-23 DIAGNOSIS — M109 Gout, unspecified: Secondary | ICD-10-CM | POA: Diagnosis not present

## 2022-10-23 DIAGNOSIS — I10 Essential (primary) hypertension: Secondary | ICD-10-CM | POA: Insufficient documentation

## 2022-10-23 DIAGNOSIS — C50411 Malignant neoplasm of upper-outer quadrant of right female breast: Secondary | ICD-10-CM

## 2022-10-23 DIAGNOSIS — J449 Chronic obstructive pulmonary disease, unspecified: Secondary | ICD-10-CM | POA: Diagnosis not present

## 2022-10-23 DIAGNOSIS — Z808 Family history of malignant neoplasm of other organs or systems: Secondary | ICD-10-CM | POA: Insufficient documentation

## 2022-10-23 DIAGNOSIS — Z87891 Personal history of nicotine dependence: Secondary | ICD-10-CM | POA: Insufficient documentation

## 2022-10-23 DIAGNOSIS — Z79899 Other long term (current) drug therapy: Secondary | ICD-10-CM | POA: Diagnosis not present

## 2022-10-23 DIAGNOSIS — Z79811 Long term (current) use of aromatase inhibitors: Secondary | ICD-10-CM | POA: Diagnosis not present

## 2022-10-23 DIAGNOSIS — Z881 Allergy status to other antibiotic agents status: Secondary | ICD-10-CM | POA: Diagnosis not present

## 2022-10-23 DIAGNOSIS — Z7189 Other specified counseling: Secondary | ICD-10-CM | POA: Diagnosis not present

## 2022-10-23 DIAGNOSIS — M129 Arthropathy, unspecified: Secondary | ICD-10-CM | POA: Insufficient documentation

## 2022-10-23 DIAGNOSIS — I509 Heart failure, unspecified: Secondary | ICD-10-CM | POA: Diagnosis not present

## 2022-10-23 DIAGNOSIS — Z803 Family history of malignant neoplasm of breast: Secondary | ICD-10-CM | POA: Diagnosis not present

## 2022-10-23 DIAGNOSIS — Z8249 Family history of ischemic heart disease and other diseases of the circulatory system: Secondary | ICD-10-CM | POA: Diagnosis not present

## 2022-10-23 DIAGNOSIS — Z9049 Acquired absence of other specified parts of digestive tract: Secondary | ICD-10-CM | POA: Diagnosis not present

## 2022-10-23 DIAGNOSIS — K219 Gastro-esophageal reflux disease without esophagitis: Secondary | ICD-10-CM | POA: Insufficient documentation

## 2022-10-23 DIAGNOSIS — Z800 Family history of malignant neoplasm of digestive organs: Secondary | ICD-10-CM | POA: Diagnosis not present

## 2022-10-23 DIAGNOSIS — Z90710 Acquired absence of both cervix and uterus: Secondary | ICD-10-CM | POA: Diagnosis not present

## 2022-10-23 DIAGNOSIS — Z885 Allergy status to narcotic agent status: Secondary | ICD-10-CM | POA: Diagnosis not present

## 2022-10-23 DIAGNOSIS — Z825 Family history of asthma and other chronic lower respiratory diseases: Secondary | ICD-10-CM | POA: Diagnosis not present

## 2022-10-23 DIAGNOSIS — Z833 Family history of diabetes mellitus: Secondary | ICD-10-CM | POA: Diagnosis not present

## 2022-10-23 MED ORDER — LETROZOLE 2.5 MG PO TABS: 2.5000 mg | ORAL_TABLET | Freq: Every day | ORAL | 3 refills | Status: DC

## 2022-10-23 NOTE — Consult Note (Signed)
NEW PATIENT EVALUATION  Name: Martha Chung  MRN: [id number]  Date:   10/23/2022     DOB: 04-03-1941   This 82 y.o. female patient presents to the clinic for initial evaluation of bilateral breast cancer Right breast stage IIb (T2P N1 M0.)  ER/PR positive HER2 negative invasive mammary carcinoma Left breast stage Ia (t1b NX M0) invasive mammary carcinoma with lobular features ER/PR positive  REFERRING PHYSICIAN: Ardyth Man, PA-C  CHIEF COMPLAINT:  Chief Complaint  Patient presents with   Breast Cancer    DIAGNOSIS: The encounter diagnosis was Malignant neoplasm of upper-outer quadrant of right breast in female, estrogen receptor positive.   PREVIOUS INVESTIGATIONS:  Mammograms ultrasound reviewed Pathology reports reviewed Clinical notes reviewed  HPI: Patient is a 82 year old female presenting with self discovered mass in the right breast.  She did not have mammograms in multiple years based on her age.  Diagnostic mammogram showed a 1.6 center mass in the 10 o'clock position of the right breast 4 mm irregular mass in the outer upper quadrant of the left breast.  Biopsy of both sites showed right breast showing invasive mammary carcinoma ER/PR positive left breast showed grade 1 invasive member carcinoma with tubular features ER/PR positive.  Ultrasound bilaterally of her axilla showed no evidence of abnormal nodes.  She underwent lumpectomy of her right breast and sentinel node biopsy.  Right breast showed 3.2 cm overall grade 2 invasive mammary carcinoma with tubular differentiation margins were clear.  1 sentinel lymph node was examined showing a 3 mm deposit of metastatic disease with no extranodal extension.  Tumor was strongly ER/PR positive HER2/neu not overexpressed.  Margins were clear at 4 mm.  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  Her  Oncotype Dx score was 16 showing no apparent benefit to systemic chemotherapy.  She is seen today for evaluation of bilateral breast radiation she is doing well.  Still having some cyst tenderness in her right axilla from the axillary incision.  All incisions are healing well.  PLANNED TREATMENT REGIMEN: Bilateral breast radiation  PAST MEDICAL HISTORY:  has a past medical history of Arthritis, CHF (congestive heart failure), Complication of anesthesia, COPD (chronic obstructive pulmonary disease), Diabetes mellitus without complication, Diverticulitis, Full dentures, GERD (gastroesophageal reflux disease), Gout, Hypertension, Hypothyroidism, Lichen sclerosus, Mitral regurgitation, Right rotator cuff tendonitis, and Wears contact lenses.    PAST SURGICAL HISTORY:  Past Surgical History:  Procedure Laterality Date   ABDOMINAL HYSTERECTOMY  1967   Partial   APPLICATION OF WOUND VAC  02/14/2015   Procedure: APPLICATION OF WOUND VAC;  Surgeon: [name], MD;  Location: [facility];  Service: General;;   BREAST BIOPSY Right 09/12/2022   u/s 10:00 coil path pending   BREAST BIOPSY Left 09/12/2022   2:00 venus [place] bx path pending   breast biopsy Left 09/12/2022   [place] bx mass 2:00 venus marker, path pending   BREAST BIOPSY Left 09/12/2022   [place] LT BREAST BX W LOC DEV 1ST LESION IMG BX SPEC US GUIDE 09/12/2022 [facility]   BREAST BIOPSY Right 09/12/2022   [place] RT BREAST BX W LOC DEV 1ST LESION IMG BX SPEC US GUIDE 09/12/2022 [facility]   BREAST BIOPSY  10/02/2022   MM LT RADIOACTIVE SEED LOC MAMMO GUIDE 10/02/2022 [facility]   BREAST BIOPSY  10/02/2022   MM RT RADIOACTIVE SEED LOC MAMMO GUIDE 10/02/2022 [facility]  BREAST EXCISIONAL BIOPSY Left 1974   benign   BREAST LUMPECTOMY WITH RADIOACTIVE SEED AND SENTINEL LYMPH NODE BIOPSY Right 10/02/2022   Procedure: RIGHT BREAST LUMPECTOMY WITH RADIOACTIVE SEED AND RIGHT AXILLARY SENTINEL LYMPH NODE BIOPSY;  Surgeon: Emelia Loron, MD;   Location: [facility];  Service: General;  Laterality: Right;   BREAST LUMPECTOMY WITH RADIOACTIVE SEED LOCALIZATION Left 10/02/2022   Procedure: LEFT BREAST LUMPECTOMY WITH RADIOACTIVE SEED LOCALIZATION;  Surgeon: Emelia Loron, MD;  Location: [facility];  Service: General;  Laterality: Left;   CHOLECYSTECTOMY     COLECTOMY WITH COLOSTOMY CREATION/HARTMANN PROCEDURE  07/03/2014   COLONOSCOPY N/A 11/25/2014   Procedure: COLONOSCOPY;  Surgeon: Midge Minium, MD;  Location: [facility];  Service: Gastroenterology;  Laterality: N/A;   COLOSTOMY TAKEDOWN N/A 01/31/2015   Procedure: COLOSTOMY TAKEDOWN, partial colectomy;  Surgeon: [name], MD;  Location: [facility];  Service: General;  Laterality: N/A;   CORRECTION OVERLAPPING TOES Bilateral 2012   bilateral hammertoes #2 & #3 both feet   HERNIA REPAIR  07/02/2013   JOINT REPLACEMENT Right 2006   knee   JOINT REPLACEMENT Left 2008   knee   KIDNEY SURGERY Right    LAPAROTOMY     construct new colostomy and place wound VAC   LAPAROTOMY N/A 02/14/2015   Procedure: RE-OPEN EXPLORATORY LAPAROTOMY, COMPLEX LYSIS OF ADHESIONS, CONSTRUCTION OF LOOP ILEOSTOMY,;  Surgeon: [name], MD;  Location: [facility];  Service: General;  Laterality: N/A;   POLYPECTOMY  11/25/2014   Procedure: POLYPECTOMY INTESTINAL;  Surgeon: Midge Minium, MD;  Location: [facility];  Service: Gastroenterology;;   TONSILLECTOMY      FAMILY HISTORY: family history includes Brain cancer (age of onset: 7) in her sister; Breast cancer (age of onset: 84) in her mother; COPD in her father and mother; Colon cancer in her paternal aunt; Heart disease in her father and mother; Hypertension in her mother; Juvenile Diabetes in her son; Lung cancer (age of onset: 16) in her brother.  SOCIAL HISTORY:  reports that she quit smoking about 23 years ago. Her smoking use included cigarettes. She has never used smokeless tobacco. She reports that she does  not drink alcohol and does not use drugs.  ALLERGIES: Ivp dye [iodinated contrast media], Morphine and related, Shellfish allergy, Dilaudid [hydromorphone hcl], Macrodantin [nitrofurantoin], and Nubain [nalbuphine hcl]  MEDICATIONS:  Current Outpatient Medications  Medication Sig Dispense Refill   allopurinol (ZYLOPRIM) 100 MG tablet Take 100 mg by mouth daily. AM     Calcium Carb-Cholecalciferol (CALCIUM 600 + D PO) Take 600 mg elemental calcium/kg/hr by mouth daily.      clobetasol cream (TEMOVATE) 0.05 % Apply 1 application topically 2 (two) times daily.     famotidine (PEPCID) 20 MG tablet Take 20 mg by mouth 2 (two) times daily.     furosemide (LASIX) 20 MG tablet Take 20 mg by mouth every morning. AM     metoprolol (LOPRESSOR) 50 MG tablet Take 50 mg by mouth 2 (two) times daily.     Multiple Vitamin (MULTIVITAMIN) tablet Take 1 tablet by mouth daily.     potassium chloride (KLOR-CON) 10 MEQ tablet Take 10 mEq by mouth daily.     diphenoxylate-atropine (LOMOTIL) 2.5-0.025 MG tablet Take by mouth.     Lancets (ONETOUCH DELICA PLUS LANCET30G) MISC daily.     levothyroxine (SYNTHROID) 88 MCG tablet Take by mouth.     meloxicam (MOBIC) 7.5 MG tablet Take by mouth.  Potassium 99 MG TABS Take 1 tablet by mouth daily.     No current facility-administered medications for this encounter.    ECOG PERFORMANCE STATUS:  0 - Asymptomatic  REVIEW OF SYSTEMS: Patient denies any weight loss, fatigue, weakness, fever, chills or night sweats. Patient denies any loss of vision, blurred vision. Patient denies any ringing  of the ears or hearing loss. No irregular heartbeat. Patient denies heart murmur or history of fainting. Patient denies any chest pain or pain radiating to her upper extremities. Patient denies any shortness of breath, difficulty breathing at night, cough or hemoptysis. Patient denies any swelling in the lower legs. Patient denies any nausea vomiting, vomiting of blood, or coffee  ground material in the vomitus. Patient denies any stomach pain. Patient states has had normal bowel movements no significant constipation or diarrhea. Patient denies any dysuria, hematuria or significant nocturia. Patient denies any problems walking, swelling in the joints or loss of balance. Patient denies any skin changes, loss of hair or loss of weight. Patient denies any excessive worrying or anxiety or significant depression. Patient denies any problems with insomnia. Patient denies excessive thirst, polyuria, polydipsia. Patient denies any swollen glands, patient denies easy bruising or easy bleeding. Patient denies any recent infections, allergies or URI. Patient "s visual fields have not changed significantly in recent time.   PHYSICAL EXAM: BP (!) 170/77 (BP Location: Left Wrist, Patient Position: Sitting, Cuff Size: Small)   Pulse 67   Temp (!) 97.5 F (36.4 C) (Tympanic)   Resp 16   Ht 5' (1.524 m)   Wt 209 lb (94.8 kg)   BMI 40.82 kg/m  Patient is status post bilateral breast biopsies both all areas are healing well.  No dominant masses noted in either breast no axillary or supraclavicular adenopathy is identified.  Well-developed well-nourished patient in NAD. HEENT reveals PERLA, EOMI, discs not visualized.  Oral cavity is clear. No oral mucosal lesions are identified. Neck is clear without evidence of cervical or supraclavicular adenopathy. Lungs are clear to A&P. Cardiac examination is essentially unremarkable with regular rate and rhythm without murmur rub or thrill. Abdomen is benign with no organomegaly or masses noted. Motor sensory and DTR levels are equal and symmetric in the upper and lower extremities. Cranial nerves II through XII are grossly intact. Proprioception is intact. No peripheral adenopathy or edema is identified. No motor or sensory levels are noted. Crude visual fields are within normal range.  LABORATORY DATA: Pathology reports reviewed    RADIOLOGY RESULTS:  Mammogram and ultrasound reviewed compatible with above-stated findings   IMPRESSION: Stage II invasive mammary carcinoma of the right breast as well as stage I invasive mammary carcinoma of the left breast in 82 year old female  PLAN: This time based on the single sentinel node involvement would treat her right breast to 5040 cGy in 28 fractions including her peripheral lymphatics.  Would also boost her scar another 1000 cGy using photon beam therapy.  Her left breast I would treat again to 5040 cGy in 28 fractions and again boost her scar another 1000 cGy using electron.  Would treat both sites concurrently.  Based on the patient's large breast size we treated the same fractionation scheme for both breasts.  Risks and benefits of treatment including skin reaction fatigue alteration blood counts possible inclusion of superficial lung all were discussed in detail with the patient.  She seems to comprehend my treatment plan well.  I have scheduled her for simulation next week to allow some  further healing.  Patient also will be a candidate for endocrine therapy after completion of radiation.  I would like to take this opportunity to thank you for allowing me to participate in the care of your patient.Carmina Miller, MD

## 2022-10-23 NOTE — Progress Notes (Signed)
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  She self palpated a right breast mass which led to a bilateral diagnostic mammogram.  This showed a 1.6 cm mass in the 10 o'clock position of the right breast and a 4 mm irregular mass in the outer upper quadrant of the left breast.  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  Left breast biopsy showed grade 1 invasive mammary carcinoma with tubular features ER/PR and HER2 pending.   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  She is independent of her ADLs and IADLs.  No prior history of breast cancer although she has had a left lumpectomy for benign lesion many years ago.  Family history of breast cancer in her mother at the age of 28.  Patient underwent bilateral lumpectomy with sentinel lymph node biopsy.  Right breast pathology from April 2024 showed 3.2 cm grade 2 invasive mammary carcinoma with negative margins.  1 sentinel lymph node positive for malignancy.  Ki-67 15%.  Left breast lumpectomy pathology showed 0.8 cm grade 1 invasive mammary carcinoma with tubular features.  Ki-67 5%.  Both tumors were strongly ER/PR positive HER2 negative.  Oncotype testing around 3.2 cm tumor came back with a recurrence score of 16.  Patient therefore does not require any adjuvant chemotherapy for her  age  Interval history-patient is recovering well from her lumpectomy.  Denies any specific complaints at this time  ECOG PS- 1 Pain scale- 0   Review of systems- Review of Systems  Constitutional:  Negative for chills, fever, malaise/fatigue and weight loss.  HENT:  Negative for congestion, ear discharge and nosebleeds.   Eyes:  Negative for blurred vision.  Respiratory:  Negative for cough, hemoptysis, sputum production, shortness of breath and wheezing.   Cardiovascular:  Negative for chest pain, palpitations, orthopnea and claudication.  Gastrointestinal:  Negative for abdominal pain, blood in stool, constipation, diarrhea, heartburn, melena, nausea and vomiting.  Genitourinary:  Negative for dysuria, flank pain, frequency, hematuria and urgency.  Musculoskeletal:  Negative for back pain, joint pain and myalgias.  Skin:  Negative for rash.  Neurological:  Negative for dizziness, tingling, focal weakness, seizures, weakness and headaches.  Endo/Heme/Allergies:  Does not bruise/bleed easily.  Psychiatric/Behavioral:  Negative for depression and suicidal ideas. The patient does not have insomnia.       Allergies  Allergen Reactions   Ivp Dye [Iodinated Contrast Media] Anaphylaxis    Topical betadine is ok.   Morphine And Related Anaphylaxis    Respiratory Arrest   Shellfish Allergy Anaphylaxis   Dilaudid [Hydromorphone Hcl]     BP bottomed out   Macrodantin [Nitrofurantoin] Nausea And Vomiting    And diarrhea   Nubain [Nalbuphine Hcl] Other (See Comments)    BP "bottoms out"     Past Medical History:  Diagnosis Date   Arthritis    hands, feet, "all over"  CHF (congestive heart failure)    acute, after use of Redux   Complication of anesthesia    on vent after colostomy revision   COPD (chronic obstructive pulmonary disease)    chronic bronchitis - no meds   Diabetes mellitus without complication    Diverticulitis    Full dentures    upper and lower   GERD  (gastroesophageal reflux disease)    Gout    Hypertension    Hypothyroidism    Lichen sclerosus    Vaginal Area   Mitral regurgitation    after use of redux   Right rotator cuff tendonitis    Wears contact lenses      Past Surgical History:  Procedure Laterality Date   ABDOMINAL HYSTERECTOMY  1967   Partial   APPLICATION OF WOUND VAC  02/14/2015   Procedure: APPLICATION OF WOUND VAC;  Surgeon: [name], MD;  Location: [facility];  Service: General;;   BREAST BIOPSY Right 09/12/2022   u/s 10:00 coil path pending   BREAST BIOPSY Left 09/12/2022   2:00 venus [place] bx path pending   breast biopsy Left 09/12/2022   [place] bx mass 2:00 venus marker, path pending   BREAST BIOPSY Left 09/12/2022   [place] LT BREAST BX W LOC DEV 1ST LESION IMG BX SPEC US GUIDE 09/12/2022 [facility]   BREAST BIOPSY Right 09/12/2022   [place] RT BREAST BX W LOC DEV 1ST LESION IMG BX SPEC US GUIDE 09/12/2022 [facility]   BREAST BIOPSY  10/02/2022   MM LT RADIOACTIVE SEED LOC MAMMO GUIDE 10/02/2022 [facility]   BREAST BIOPSY  10/02/2022   MM RT RADIOACTIVE SEED LOC MAMMO GUIDE 10/02/2022 [facility]   BREAST EXCISIONAL BIOPSY Left 1974   benign   BREAST LUMPECTOMY WITH RADIOACTIVE SEED AND SENTINEL LYMPH NODE BIOPSY Right 10/02/2022   Procedure: RIGHT BREAST LUMPECTOMY WITH RADIOACTIVE SEED AND RIGHT AXILLARY SENTINEL LYMPH NODE BIOPSY;  Surgeon: Emelia Loron, MD;  Location: [facility];  Service: General;  Laterality: Right;   BREAST LUMPECTOMY WITH RADIOACTIVE SEED LOCALIZATION Left 10/02/2022   Procedure: LEFT BREAST LUMPECTOMY WITH RADIOACTIVE SEED LOCALIZATION;  Surgeon: Emelia Loron, MD;  Location: [facility];  Service: General;  Laterality: Left;   CHOLECYSTECTOMY     COLECTOMY WITH COLOSTOMY CREATION/HARTMANN PROCEDURE  07/03/2014   COLONOSCOPY N/A 11/25/2014   Procedure: COLONOSCOPY;  Surgeon: Midge Minium, MD;  Location: [facility];  Service:  Gastroenterology;  Laterality: N/A;   COLOSTOMY TAKEDOWN N/A 01/31/2015   Procedure: COLOSTOMY TAKEDOWN, partial colectomy;  Surgeon: [name], MD;  Location: [facility];  Service: General;  Laterality: N/A;   CORRECTION OVERLAPPING TOES Bilateral 2012   bilateral hammertoes #2 & #3 both feet   HERNIA REPAIR  07/02/2013   JOINT REPLACEMENT Right 2006   knee   JOINT REPLACEMENT Left 2008   knee   KIDNEY SURGERY Right    LAPAROTOMY     construct new colostomy and place wound VAC   LAPAROTOMY N/A 02/14/2015   Procedure: RE-OPEN EXPLORATORY LAPAROTOMY, COMPLEX LYSIS OF ADHESIONS, CONSTRUCTION OF LOOP ILEOSTOMY,;  Surgeon: [name], MD;  Location: [facility];  Service: General;  Laterality: N/A;   POLYPECTOMY  11/25/2014   Procedure: POLYPECTOMY INTESTINAL;  Surgeon: Midge Minium, MD;  Location: [facility];  Service: Gastroenterology;;   TONSILLECTOMY      Social History   Socioeconomic History   Marital status: Widowed    Spouse name: Not on file   Number of children: Not  on file   Years of education: Not on file   Highest education level: Not on file  Occupational History   Not on file  Tobacco Use   Smoking status: Former    Types: Cigarettes    Quit date: 07/03/1999    Years since quitting: 23.3   Smokeless tobacco: Never  Substance and Sexual Activity   Alcohol use: No   Drug use: No   Sexual activity: Never    Birth control/protection: None  Other Topics Concern   Not on file  Social History Narrative   Not on file   Social Determinants of Health   Financial Resource Strain: Not on file  Food Insecurity: Not on file  Transportation Needs: Not on file  Physical Activity: Not on file  Stress: Not on file  Social Connections: Not on file  Intimate Partner Violence: Not on file    Family History  Problem Relation Age of Onset   Hypertension Mother    COPD Mother    Heart disease Mother    Breast cancer Mother 30   Heart disease Father    COPD Father     Brain cancer Sister 7       tumor   Lung cancer Brother 17   Colon cancer Paternal Aunt        d. 69s   Juvenile Diabetes Son      Current Outpatient Medications:    allopurinol (ZYLOPRIM) 100 MG tablet, Take 100 mg by mouth daily. AM, Disp: , Rfl:    Calcium Carb-Cholecalciferol (CALCIUM 600 + D PO), Take 600 mg elemental calcium/kg/hr by mouth daily. , Disp: , Rfl:    clobetasol cream (TEMOVATE) 0.05 %, Apply 1 application topically 2 (two) times daily., Disp: , Rfl:    diphenoxylate-atropine (LOMOTIL) 2.5-0.025 MG tablet, Take by mouth., Disp: , Rfl:    famotidine (PEPCID) 20 MG tablet, Take 20 mg by mouth 2 (two) times daily., Disp: , Rfl:    furosemide (LASIX) 20 MG tablet, Take 20 mg by mouth every morning. AM, Disp: , Rfl:    Lancets (ONETOUCH DELICA PLUS LANCET30G) MISC, daily., Disp: , Rfl:    letrozole (FEMARA) 2.5 MG tablet, Take 1 tablet (2.5 mg total) by mouth daily., Disp: 30 tablet, Rfl: 3   levothyroxine (SYNTHROID) 88 MCG tablet, Take by mouth., Disp: , Rfl:    meloxicam (MOBIC) 7.5 MG tablet, Take by mouth., Disp: , Rfl:    metoprolol (LOPRESSOR) 50 MG tablet, Take 50 mg by mouth 2 (two) times daily., Disp: , Rfl:    Multiple Vitamin (MULTIVITAMIN) tablet, Take 1 tablet by mouth daily., Disp: , Rfl:    Potassium 99 MG TABS, Take 1 tablet by mouth daily., Disp: , Rfl:    potassium chloride (KLOR-CON) 10 MEQ tablet, Take 10 mEq by mouth daily., Disp: , Rfl:   Physical exam:  Vitals:   10/23/22 1104  BP: (!) 145/57  Pulse: 61  Temp: 97.9 F (36.6 C)  TempSrc: Tympanic  SpO2: 100%  Weight: 209 lb (94.8 kg)  Height: 5' (1.524 m)   Physical Exam Cardiovascular:     Rate and Rhythm: Normal rate and regular rhythm.     Heart sounds: Normal heart sounds.  Pulmonary:     Effort: Pulmonary effort is normal.  Skin:    General: Skin is warm and dry.  Neurological:     Mental Status: She is alert and oriented to person, place, and time.  Latest Ref Rng  & Units 09/25/2022    3:44 PM  CMP  Glucose 70 - 99 mg/dL 045   BUN 8 - 23 mg/dL 25   Creatinine 4.09 - 1.00 mg/dL 8.11   Sodium 914 - 782 mmol/L 141   Potassium 3.5 - 5.1 mmol/L 4.6   Chloride 98 - 111 mmol/L 105   CO2 22 - 32 mmol/L 26   Calcium 8.9 - 10.3 mg/dL 9.0       Latest Ref Rng & Units 09/13/2019    7:45 AM  CBC  WBC 4.0 - 10.5 K/uL 6.4   Hemoglobin 12.0 - 15.0 g/dL 95.6   Hematocrit 21.3 - 46.0 % 39.9   Platelets 150 - 400 K/uL 135     No images are attached to the encounter.  MM Breast Surgical Specimen  Result Date: 10/02/2022 CLINICAL DATA:  Evaluate surgical specimen following lumpectomy for RIGHT breast cancer. EXAM: SPECIMEN RADIOGRAPH OF THE RIGHT BREAST COMPARISON:  Previous exam(s). FINDINGS: Status post excision of the RIGHT breast. The radioactive seed and COIL biopsy marker clip are present and intact. IMPRESSION: Specimen radiograph of the RIGHT breast. Electronically Signed   By: Harmon Pier M.D.   On: 10/02/2022 15:09  MM Breast Surgical Specimen  Result Date: 10/02/2022 CLINICAL DATA:  Evaluate surgical specimen following lumpectomy for LEFT breast cancer. EXAM: SPECIMEN RADIOGRAPH OF THE LEFT BREAST COMPARISON:  Previous exam(s). FINDINGS: Status post excision of the LEFT breast. The radioactive seed and biopsy marker clip are present and completely intact. IMPRESSION: Specimen radiograph of the LEFT breast. Electronically Signed   By: Harmon Pier M.D.   On: 10/02/2022 14:36  MM RT RADIOACTIVE SEED LOC MAMMO GUIDE  Result Date: 10/02/2022 CLINICAL DATA:  82 year old female for radioactive seed localizations of RIGHT breast cancer and LEFT breast cancer. EXAM: MAMMOGRAPHIC GUIDED RADIOACTIVE SEED LOCALIZATION OF THE RIGHT BREAST MAMMOGRAPHIC GUIDED RADIOACTIVE SEED LOCALIZATION OF THE LEFT BREAST COMPARISON:  Previous exam(s). FINDINGS: Patient presents for radioactive seed localizations prior to bilateral lumpectomies. I met with the patient and we discussed  the procedure of seed localization including benefits and alternatives. We discussed the high likelihood of successful procedures. We discussed the risks of the procedure including infection, bleeding, tissue injury and further surgery. We discussed the low dose of radioactivity involved in the procedure. Informed, written consent was given. The usual time-out protocol was performed immediately prior to the procedures. MAMMOGRAPHIC GUIDED RADIOACTIVE SEED LOCALIZATION OF THE RIGHT BREAST Using mammographic guidance, sterile technique, 1% lidocaine and an I-125 radioactive seed, the COIL biopsy clip was localized using a LATERAL approach. The follow-up mammogram images confirm the seed in the expected location and were marked for Dr. Dwain Sarna. Follow-up survey of the patient confirms presence of the radioactive seed. Order number of I-125 seed:  [id number]. Total activity:  0.241 millicuries.  Reference Date: 08/29/2022. MAMMOGRAPHIC GUIDED RADIOACTIVE SEED LOCALIZATION OF THE LEFT BREAST Using mammographic guidance, sterile technique, 1% lidocaine and an I-125 radioactive seed, the VENUS biopsy clip was localized using a LATERAL approach. The follow-up mammogram images confirm the seed in the expected location and were marked for Dr. Dwain Sarna. Follow-up survey of the patient confirms presence of the radioactive seed. Order number of I-125 seed:  [id number]. Total activity:  0.260 millicuries.  Reference Date: 09/05/2022. The patient tolerated the procedures well and was released from the [facility]. She was given instructions regarding seed removal. IMPRESSION: Radioactive seed localization of the RIGHT breast. Radioactive seed localization of the LEFT breast.  No apparent complications. Electronically Signed   By: Harmon Pier M.D.   On: 10/02/2022 10:27  MM LT RADIOACTIVE SEED LOC MAMMO GUIDE  Result Date: 10/02/2022 CLINICAL DATA:  82 year old female for radioactive seed localizations of RIGHT breast cancer  and LEFT breast cancer. EXAM: MAMMOGRAPHIC GUIDED RADIOACTIVE SEED LOCALIZATION OF THE RIGHT BREAST MAMMOGRAPHIC GUIDED RADIOACTIVE SEED LOCALIZATION OF THE LEFT BREAST COMPARISON:  Previous exam(s). FINDINGS: Patient presents for radioactive seed localizations prior to bilateral lumpectomies. I met with the patient and we discussed the procedure of seed localization including benefits and alternatives. We discussed the high likelihood of successful procedures. We discussed the risks of the procedure including infection, bleeding, tissue injury and further surgery. We discussed the low dose of radioactivity involved in the procedure. Informed, written consent was given. The usual time-out protocol was performed immediately prior to the procedures. MAMMOGRAPHIC GUIDED RADIOACTIVE SEED LOCALIZATION OF THE RIGHT BREAST Using mammographic guidance, sterile technique, 1% lidocaine and an I-125 radioactive seed, the COIL biopsy clip was localized using a LATERAL approach. The follow-up mammogram images confirm the seed in the expected location and were marked for Dr. Dwain Sarna. Follow-up survey of the patient confirms presence of the radioactive seed. Order number of I-125 seed:  [id number]. Total activity:  0.241 millicuries.  Reference Date: 08/29/2022. MAMMOGRAPHIC GUIDED RADIOACTIVE SEED LOCALIZATION OF THE LEFT BREAST Using mammographic guidance, sterile technique, 1% lidocaine and an I-125 radioactive seed, the VENUS biopsy clip was localized using a LATERAL approach. The follow-up mammogram images confirm the seed in the expected location and were marked for Dr. Dwain Sarna. Follow-up survey of the patient confirms presence of the radioactive seed. Order number of I-125 seed:  [id number]. Total activity:  0.260 millicuries.  Reference Date: 09/05/2022. The patient tolerated the procedures well and was released from the [facility]. She was given instructions regarding seed removal. IMPRESSION: Radioactive seed  localization of the RIGHT breast. Radioactive seed localization of the LEFT breast. No apparent complications. Electronically Signed   By: Harmon Pier M.D.   On: 10/02/2022 10:27    Assessment and plan- Patient is a 82 y.o. female with history of bilateral stage I ER/PR positive HER2 negative breast cancer here to discuss final pathology results and further management  Right breast cancer: This was 3.2 cm grade 2 tumor with negative margins.  1 sentinel lymph node with macrometastases of 3 mm.  Margins negative.  ER/PR greater than 90% positive and HER2 negative.  Oncotype testing was done and came back with a recurrence score of 16.  Patient therefore does not require adjuvant chemotherapy for this at her age.  Left breast cancer: This was a 0.8 cm grade 1 tumor with negative margins.  This was an invasive ductal carcinoma with tubular features.  Given that it was less than 1 cm she does not meet criteria for Oncotype testing on this tumor.  As such patient does not require any adjuvant chemotherapy at this time for her bilateral breast cancer.  She has met with radiation oncology and proceed with adjuvant radiation therapy.  Given that her tumor was ER PR positive hormone therapy is indicated at this time.  Idiscussed the role for hormone therapy. Given that she is postmenopausal I would favor 5 years of adjuvant hormone therapy with aromatase inhibitor. I discussed the risks and benefits of letrozole including all but not limited to fatigue, hypercholesterolemia, hot flashes, arthralgias, hyperlipidemia and worsening bone health.  Patient will also need to be on calcium 1200 mg along with  vitamin D 800 international units.  We will obtain a baseline bone density scan end of this month.  Written information about letrozole given to the patient. I would like her to finish radiation therapy and start hormone therapy thereafter. Patient verbalized understanding and agrees to proceed.  Treatment will be  given with a curative intent.  I will see her back in 3 months with a CMP   Cancer Staging  Malignant neoplasm of upper-outer quadrant of left breast in female, estrogen receptor positive Staging form: Breast, AJCC 8th Edition - Pathologic stage from 10/23/2022: Stage IA (pT1b, pN0, cM0, G1, ER+, PR+, HER2-) - Signed by Creig Hines, MD on 10/23/2022 Multigene prognostic tests performed: None Histologic grading system: 3 grade system  Malignant neoplasm of upper-outer quadrant of right breast in female, estrogen receptor positive Staging form: Breast, AJCC 8th Edition - Clinical stage from 09/17/2022: Stage IA (cT1c, cN0, cM0, G2, ER+, PR+, HER2-) - Signed by Creig Hines, MD on 09/17/2022 Histologic grading system: 3 grade system - Pathologic stage from 10/23/2022: Stage IB (pT2, pN1a, cM0, G2, ER+, PR+, HER2-, Oncotype DX score: 16) - Signed by Creig Hines, MD on 10/23/2022 Stage prefix: Initial diagnosis Multigene prognostic tests performed: Oncotype DX Recurrence score range: Greater than or equal to 11 Histologic grading system: 3 grade system       Visit Diagnosis 1. Malignant neoplasm of upper-outer quadrant of right breast in female, estrogen receptor positive   2. Goals of care, counseling/discussion   3. Malignant neoplasm of upper-outer quadrant of left breast in female, estrogen receptor positive      Dr. Owens Shark, MD, MPH [facility] at [facility] [phone] 10/23/2022 12:21 PM

## 2022-10-23 NOTE — Progress Notes (Signed)
No concerns for the provider. 

## 2022-10-25 NOTE — Therapy (Addendum)
 OUTPATIENT PHYSICAL THERAPY BREAST CANCER POST OP FOLLOW UP   Patient Name: Martha Chung MRN: [id number] DOB:[birth year], 82 y.o., female Today's Date: 10/26/2022  END OF SESSION:  PT End of Session - 10/26/22 0951     Visit Number 2    Number of Visits 2    Date for PT Re-Evaluation 11/20/22    PT Start Time 1000    PT Stop Time 1031    PT Time Calculation (min) 31 min    Activity Tolerance Patient tolerated treatment well    Behavior During Therapy WFL for tasks assessed/performed             Past Medical History:  Diagnosis Date   Arthritis    hands, feet, "all over"   CHF (congestive heart failure) (HCC)    acute, after use of Redux   Complication of anesthesia    on vent after colostomy revision   COPD (chronic obstructive pulmonary disease) (HCC)    chronic bronchitis - no meds   Diabetes mellitus without complication (HCC)    Diverticulitis    Full dentures    upper and lower   GERD (gastroesophageal reflux disease)    Gout    Hypertension    Hypothyroidism    Lichen sclerosus    Vaginal Area   Mitral regurgitation    after use of redux   Right rotator cuff tendonitis    Wears contact lenses    Past Surgical History:  Procedure Laterality Date   ABDOMINAL HYSTERECTOMY  1967   Partial   APPLICATION OF WOUND VAC  02/14/2015   Procedure: APPLICATION OF WOUND VAC;  Surgeon: [name], MD;  Location: [facility];  Service: General;;   BREAST BIOPSY Right 09/12/2022   u/s 10:00 coil path pending   BREAST BIOPSY Left 09/12/2022   2:00 venus [place] bx path pending   breast biopsy Left 09/12/2022   [place] bx mass 2:00 venus marker, path pending   BREAST BIOPSY Left 09/12/2022   [place] LT BREAST BX W LOC DEV 1ST LESION IMG BX SPEC US GUIDE 09/12/2022 [facility]   BREAST BIOPSY Right 09/12/2022   [place] RT BREAST BX W LOC DEV 1ST LESION IMG BX SPEC US GUIDE 09/12/2022 [facility]   BREAST BIOPSY  10/02/2022   MM LT RADIOACTIVE SEED LOC MAMMO GUIDE 10/02/2022 [facility]   BREAST BIOPSY  10/02/2022   MM RT RADIOACTIVE SEED LOC MAMMO GUIDE 10/02/2022 [facility]   BREAST EXCISIONAL BIOPSY Left 1974   benign   BREAST LUMPECTOMY WITH RADIOACTIVE SEED AND SENTINEL LYMPH NODE BIOPSY Right 10/02/2022   Procedure: RIGHT BREAST LUMPECTOMY WITH RADIOACTIVE SEED AND RIGHT AXILLARY SENTINEL LYMPH NODE BIOPSY;  Surgeon: Emelia Loron, MD;  Location: [facility];  Service: General;  Laterality: Right;   BREAST LUMPECTOMY WITH RADIOACTIVE SEED LOCALIZATION Left 10/02/2022   Procedure: LEFT BREAST LUMPECTOMY WITH RADIOACTIVE SEED LOCALIZATION;  Surgeon: Emelia Loron, MD;  Location: [facility];  Service: General;  Laterality: Left;   CHOLECYSTECTOMY     COLECTOMY WITH COLOSTOMY CREATION/HARTMANN PROCEDURE  07/03/2014   COLONOSCOPY N/A 11/25/2014   Procedure: COLONOSCOPY;  Surgeon: Midge Minium, MD;  Location: [facility];  Service: Gastroenterology;  Laterality: N/A;   COLOSTOMY TAKEDOWN N/A 01/31/2015   Procedure: COLOSTOMY TAKEDOWN, partial colectomy;  Surgeon: [name], MD;  Location: [facility];  Service: General;  Laterality: N/A;   CORRECTION OVERLAPPING TOES Bilateral 2012   bilateral hammertoes #2 & #3 both feet   HERNIA  REPAIR  07/02/2013   JOINT REPLACEMENT Right 2006   knee   JOINT REPLACEMENT Left 2008   knee   KIDNEY SURGERY Right    LAPAROTOMY     construct new colostomy and place wound VAC   LAPAROTOMY N/A 02/14/2015   Procedure: RE-OPEN EXPLORATORY LAPAROTOMY, COMPLEX LYSIS OF ADHESIONS, CONSTRUCTION OF LOOP ILEOSTOMY,;  Surgeon: [name], MD;  Location: [facility];  Service: General;  Laterality: N/A;   POLYPECTOMY  11/25/2014   Procedure: POLYPECTOMY INTESTINAL;  Surgeon: Midge Minium, MD;  Location: [facility];  Service: Gastroenterology;;   TONSILLECTOMY     Patient Active Problem List   Diagnosis Date Noted   Malignant neoplasm of upper-outer quadrant of left breast in female,  estrogen receptor positive (HCC) 10/23/2022   Genetic testing 10/02/2022   Malignant neoplasm of upper-outer quadrant of right breast in female, estrogen receptor positive (HCC) 09/17/2022   SBO (small bowel obstruction) (HCC) 09/11/2019   COPD (chronic obstructive pulmonary disease) (HCC) 09/11/2019   Chronic diastolic CHF (congestive heart failure) (HCC) 09/11/2019   Leukocytosis 09/11/2019   Adjustment disorder with mixed anxiety and depressed mood 02/17/2015   Other postoperative complication involving digestive system    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 Dwain Sarna   REFERRING DIAG: C50.411,Z17.0 (ICD-10-CM) - Malignant neoplasm of upper-outer quadrant of right breast in female, estrogen receptor positive (HCC)  THERAPY DIAG:  Malignant neoplasm of upper-outer quadrant of right breast in female, estrogen receptor positive [facility])  Aftercare following surgery for neoplasm  Chronic right shoulder pain  At risk for lymphedema  Rationale for Evaluation and Treatment: Rehabilitation  ONSET DATE: 09/07/22  SUBJECTIVE:                                                                                                                                                                                           SUBJECTIVE STATEMENT: No limitations.  I am doing all my daily activities.  I wore the compression bra for the first few weeks.    PERTINENT HISTORY:  Bil breast lumpectomy and SLNB only on the Rt on 10/02/22 with 1 positive node removed on the right. No chemo needed.  Will be having radiation on both sides.  Other hx includes: heart failure   PATIENT GOALS:  Reassess how my recovery is going related to arm function, pain, and swelling.  PAIN:  Are you having pain? Nothing really.  Intermittent stabbing pain in the breast and the arm nerve pain.    PRECAUTIONS: Recent Surgery, right UE Lymphedema risk  ACTIVITY LEVEL / LEISURE: back to normal levels    OBJECTIVE:   PATIENT SURVEYS:  QUICK DASH: 13% from 18%  OBSERVATIONS: Incisions are healing well but still with steristrips present.    POSTURE:  Rounded shoulders forward head.    LYMPHEDEMA ASSESSMENT:   UPPER EXTREMITY AROM/PROM:   A/PROM RIGHT   eval   10/26/22  Shoulder extension 50 50  Shoulder flexion 130 120  Shoulder abduction 160 - pain 150 - pain  Shoulder internal rotation     Shoulder external rotation 80 75                          (Blank rows = not tested)   A/PROM LEFT   eval 10/26/22  Shoulder extension 55 60  Shoulder flexion 140 130  Shoulder abduction 150 140  Shoulder internal rotation     Shoulder external rotation 80 80                          (Blank rows = not tested)   CERVICAL AROM: All within normal limits:    LYMPHEDEMA ASSESSMENTS:    LANDMARK RIGHT   eval 10/26/22  10 cm proximal to olecranon process 37.3 42  Olecranon process 28.3 29.5  10 cm proximal to ulnar styloid process 24.7 23.2  Just proximal to ulnar styloid process 17.1 16.7  Across hand at thumb web space 20.5 19.8  At base of 2nd digit 7.0 6.7  (Blank rows = not tested)   LANDMARK LEFT   eval   10 cm proximal to olecranon process 39.5 39.5  Olecranon process 28.6 30  10  cm proximal to ulnar styloid process 23.5 24  Just proximal to ulnar styloid process 18.2 18.2  Across hand at thumb web space 21 19.5  At base of 2nd digit 6.7 6.7  (Blank rows = not tested)   PATIENT EDUCATION:  Education details: post op instructions per instruction section  Person educated: Patient and Child(ren) Education method:  Explanation and Handouts Education comprehension: verbalized understanding  HOME EXERCISE PROGRAM: Reviewed previously given post op HEP.   ASSESSMENT:  CLINICAL IMPRESSION: Pt has returned to baseline functional status.  Her measurements are close to baseline but a bit off mainly due to bil shoulder OA/ different shoulder pain levels of different days.  She will start radiation next week or the week after on bil breasts and Rt node beds.  Pt knows to come back to PT for SOZO screens which are scheduled and with any worsening of shoulder pain post radiation.   Pt will benefit from skilled therapeutic intervention to improve on the following deficits: Decreased knowledge of precautions, impaired UE functional use, pain, decreased ROM, postural dysfunction.   PT treatment/interventions: ADL/Self care home management, Therapeutic exercises, Patient/Family education, Self Care, and Re-evaluation   GOALS: Goals reviewed with patient? Yes  LONG TERM GOALS:  (STG=LTG)  GOALS Name Target Date  Goal status  1 Pt will demonstrate she has regained full shoulder ROM and function post operatively compared to baselines.  Baseline: 10/26/22 Mostly met  PLAN:  PT FREQUENCY/DURATION: SOZO screens only  PLAN FOR NEXT SESSION: SOZO only  PHYSICAL THERAPY DISCHARGE SUMMARY  Visits from Start of Care: 2  Current functional level related to goals / functional outcomes: Per above   Remaining deficits: lymphedema risk   Education / Equipment: Self care HEP   Plan: Patient agrees to discharge.       [facility]  [address], [address]  [place] [address]  [phone]  After Breast Cancer Class It is recommended you attend the ABC class to be educated on lymphedema risk reduction. This class is free of charge and lasts for 1 hour. It is a 1-time class. You will need to download the TEAMS app either on your phone or computer. We will send  you a link the night before or the morning of the class. You should be able to click on that link to join the class. This is not a confidential class. You don't have to turn your camera on, but other participants may be able to see your email address.  Scar massage You can begin gentle scar massage to you incision sites. Gently place one hand on the incision and move the skin (without sliding on the skin) in various directions. Do this for a few minutes and then you can gently massage either coconut oil or vitamin E cream into the scars.  Compression garment You should continue wearing your compression bra until you feel like you no longer have swelling.  Home exercise Program Continue doing the exercises you were given until you feel like you can do them without feeling any tightness at the end.   Walking Program Studies show that 30 minutes of walking per day (fast enough to elevate your heart rate) can significantly reduce the risk of a cancer recurrence. If you can't walk due to other medical reasons, we encourage you to find another activity you could do (like a stationary bike or water exercise).  Posture After breast cancer surgery, people frequently sit with rounded shoulders posture because it puts their incisions on slack and feels better. If you sit like this and scar tissue forms in that position, you can become very tight and have pain sitting or standing with good posture. Try to be aware of your posture and sit and stand up tall to heal properly.  Follow up PT: It is recommended you return every 3 months for the first 3 years following surgery to be assessed on the SOZO machine for an L-Dex score. This helps prevent clinically significant lymphedema in 95% of patients. These follow up screens are 10 minute appointments that you are not billed for.  Idamae Lusher, PT 10/26/2022, 10:36 AM

## 2022-10-26 ENCOUNTER — Encounter: Payer: Self-pay | Admitting: Rehabilitation

## 2022-10-26 ENCOUNTER — Ambulatory Visit: Payer: Medicare HMO | Attending: General Surgery | Admitting: Rehabilitation

## 2022-10-26 DIAGNOSIS — G8929 Other chronic pain: Secondary | ICD-10-CM | POA: Insufficient documentation

## 2022-10-26 DIAGNOSIS — Z170 Estrogen receptor positive status [ER+]: Secondary | ICD-10-CM | POA: Diagnosis present

## 2022-10-26 DIAGNOSIS — M25511 Pain in right shoulder: Secondary | ICD-10-CM | POA: Insufficient documentation

## 2022-10-26 DIAGNOSIS — Z9189 Other specified personal risk factors, not elsewhere classified: Secondary | ICD-10-CM | POA: Diagnosis present

## 2022-10-26 DIAGNOSIS — C50411 Malignant neoplasm of upper-outer quadrant of right female breast: Secondary | ICD-10-CM | POA: Diagnosis not present

## 2022-10-26 DIAGNOSIS — Z483 Aftercare following surgery for neoplasm: Secondary | ICD-10-CM | POA: Insufficient documentation

## 2022-10-26 NOTE — Patient Instructions (Signed)
     [facility]  [address], [address]  [place] [address]  [phone]  After Breast Cancer Class It is recommended you attend the ABC class to be educated on lymphedema risk reduction. This class is free of charge and lasts for 1 hour. It is a 1-time class. We will send you a link the night before or the morning of the class. You should be able to click on that link to join the class. This is not a confidential class. You don't have to turn your camera on, but other participants may be able to see your email address.  Scar massage You can begin gentle scar massage to you incision sites. Gently place one hand on the incision and move the skin (without sliding on the skin) in various directions. Do this for a few minutes and then you can gently massage either coconut oil or vitamin E cream into the scars.  Compression garment You should continue wearing your compression bra until you feel like you no longer have swelling.  Home exercise Program Continue doing the exercises you were given until you feel like you can do them without feeling any tightness at the end.   Walking Program Studies show that 30 minutes of walking per day (fast enough to elevate your heart rate) can significantly reduce the risk of a cancer recurrence. If you can't walk due to other medical reasons, we encourage you to find another activity you could do (like a stationary bike or water exercise).  Posture After breast cancer surgery, people frequently sit with rounded shoulders posture because it puts their incisions on slack and feels better. If you sit like this and scar tissue forms in that position, you can become very tight and have pain sitting or standing with good posture. Try to be aware of your posture and sit and stand up tall to heal properly.  Follow up PT: It is recommended you return every 3 months for the first 2 years following surgery to be assessed on the SOZO machine  for an L-Dex score. This helps prevent clinically significant lymphedema in 95% of patients. These follow up screens are 10 minute appointments that you are not billed for.

## 2022-10-30 ENCOUNTER — Encounter: Payer: Self-pay | Admitting: Licensed Clinical Social Worker

## 2022-10-30 ENCOUNTER — Ambulatory Visit
Admission: RE | Admit: 2022-10-30 | Discharge: 2022-10-30 | Disposition: A | Discharge status: Home / Self Care | Payer: Medicare HMO | Source: Ambulatory Visit | Attending: Oncology | Admitting: Oncology

## 2022-10-30 DIAGNOSIS — C50411 Malignant neoplasm of upper-outer quadrant of right female breast: Secondary | ICD-10-CM | POA: Diagnosis present

## 2022-10-30 DIAGNOSIS — E039 Hypothyroidism, unspecified: Secondary | ICD-10-CM | POA: Diagnosis not present

## 2022-10-30 DIAGNOSIS — J449 Chronic obstructive pulmonary disease, unspecified: Secondary | ICD-10-CM | POA: Diagnosis not present

## 2022-10-30 DIAGNOSIS — M3500 Sicca syndrome, unspecified: Secondary | ICD-10-CM | POA: Insufficient documentation

## 2022-10-30 DIAGNOSIS — Z170 Estrogen receptor positive status [ER+]: Secondary | ICD-10-CM | POA: Diagnosis not present

## 2022-10-30 DIAGNOSIS — Z13820 Encounter for screening for osteoporosis: Secondary | ICD-10-CM | POA: Diagnosis not present

## 2022-10-30 DIAGNOSIS — Z780 Asymptomatic menopausal state: Secondary | ICD-10-CM | POA: Insufficient documentation

## 2022-10-30 NOTE — Progress Notes (Signed)
[facility] Clinical Social Work  Clinical Social Work was referred by medical provider for assessment of psychosocial needs.  Clinical Social Worker contacted patient by phone to offer support and assess for needs.  CSW spoke with patient she stated she was not available to speak at the moment, but would be available on Thursday 5/2 around 1:00PM. CSW stated I would contact the patient on Thursday 5/2.       Joseph Art, LCSW  Clinical Social Worker [facility]

## 2022-10-31 ENCOUNTER — Ambulatory Visit
Admission: RE | Admit: 2022-10-31 | Discharge: 2022-10-31 | Disposition: A | Discharge status: Home / Self Care | Payer: Medicare HMO | Source: Ambulatory Visit | Attending: Radiation Oncology | Admitting: Radiation Oncology

## 2022-10-31 ENCOUNTER — Encounter: Payer: Self-pay | Admitting: *Deleted

## 2022-10-31 DIAGNOSIS — Z510 Encounter for antineoplastic radiation therapy: Secondary | ICD-10-CM | POA: Insufficient documentation

## 2022-10-31 DIAGNOSIS — Z808 Family history of malignant neoplasm of other organs or systems: Secondary | ICD-10-CM | POA: Insufficient documentation

## 2022-10-31 DIAGNOSIS — C50412 Malignant neoplasm of upper-outer quadrant of left female breast: Secondary | ICD-10-CM | POA: Diagnosis present

## 2022-10-31 DIAGNOSIS — C50411 Malignant neoplasm of upper-outer quadrant of right female breast: Secondary | ICD-10-CM | POA: Insufficient documentation

## 2022-10-31 DIAGNOSIS — Z170 Estrogen receptor positive status [ER+]: Secondary | ICD-10-CM | POA: Insufficient documentation

## 2022-10-31 DIAGNOSIS — I10 Essential (primary) hypertension: Secondary | ICD-10-CM | POA: Insufficient documentation

## 2022-10-31 DIAGNOSIS — M129 Arthropathy, unspecified: Secondary | ICD-10-CM | POA: Insufficient documentation

## 2022-10-31 DIAGNOSIS — K219 Gastro-esophageal reflux disease without esophagitis: Secondary | ICD-10-CM | POA: Insufficient documentation

## 2022-10-31 DIAGNOSIS — I509 Heart failure, unspecified: Secondary | ICD-10-CM | POA: Insufficient documentation

## 2022-10-31 DIAGNOSIS — E119 Type 2 diabetes mellitus without complications: Secondary | ICD-10-CM | POA: Insufficient documentation

## 2022-10-31 DIAGNOSIS — E039 Hypothyroidism, unspecified: Secondary | ICD-10-CM | POA: Insufficient documentation

## 2022-10-31 DIAGNOSIS — Z87891 Personal history of nicotine dependence: Secondary | ICD-10-CM | POA: Insufficient documentation

## 2022-10-31 DIAGNOSIS — J449 Chronic obstructive pulmonary disease, unspecified: Secondary | ICD-10-CM | POA: Insufficient documentation

## 2022-10-31 DIAGNOSIS — Z79899 Other long term (current) drug therapy: Secondary | ICD-10-CM | POA: Insufficient documentation

## 2022-10-31 DIAGNOSIS — M109 Gout, unspecified: Secondary | ICD-10-CM | POA: Insufficient documentation

## 2022-11-01 ENCOUNTER — Inpatient Hospital Stay: Payer: Medicare HMO | Attending: Oncology | Admitting: Licensed Clinical Social Worker

## 2022-11-01 DIAGNOSIS — Z170 Estrogen receptor positive status [ER+]: Secondary | ICD-10-CM | POA: Insufficient documentation

## 2022-11-01 DIAGNOSIS — C50412 Malignant neoplasm of upper-outer quadrant of left female breast: Secondary | ICD-10-CM | POA: Insufficient documentation

## 2022-11-01 DIAGNOSIS — C50411 Malignant neoplasm of upper-outer quadrant of right female breast: Secondary | ICD-10-CM | POA: Insufficient documentation

## 2022-11-01 NOTE — Progress Notes (Signed)
   Clinical Social Worker  [facility] Clinical Social Work  Clinical Social Work was referred by medical provider for assessment of psychosocial needs.  Clinical Social Worker attempted to contact patient by phone to offer support and assess for needs.  CSW was unable to leave  voicemail, voicemail was full.    SA   Joseph Art, LCSW  Clinical Social Worker [facility]

## 2022-11-02 ENCOUNTER — Other Ambulatory Visit: Payer: Self-pay | Admitting: *Deleted

## 2022-11-02 DIAGNOSIS — Z170 Estrogen receptor positive status [ER+]: Secondary | ICD-10-CM

## 2022-11-05 DIAGNOSIS — Z510 Encounter for antineoplastic radiation therapy: Secondary | ICD-10-CM | POA: Diagnosis not present

## 2022-11-07 ENCOUNTER — Ambulatory Visit
Admission: RE | Admit: 2022-11-07 | Discharge: 2022-11-07 | Disposition: A | Discharge status: Home / Self Care | Payer: Medicare HMO | Source: Ambulatory Visit | Attending: Radiation Oncology | Admitting: Radiation Oncology

## 2022-11-07 DIAGNOSIS — Z510 Encounter for antineoplastic radiation therapy: Secondary | ICD-10-CM | POA: Diagnosis not present

## 2022-11-08 ENCOUNTER — Encounter: Payer: Self-pay | Admitting: Licensed Clinical Social Worker

## 2022-11-08 ENCOUNTER — Ambulatory Visit
Admission: RE | Admit: 2022-11-08 | Discharge: 2022-11-08 | Disposition: A | Discharge status: Home / Self Care | Payer: Medicare HMO | Source: Ambulatory Visit | Attending: Radiation Oncology | Admitting: Radiation Oncology

## 2022-11-08 ENCOUNTER — Other Ambulatory Visit: Payer: Self-pay

## 2022-11-08 DIAGNOSIS — Z510 Encounter for antineoplastic radiation therapy: Secondary | ICD-10-CM | POA: Diagnosis not present

## 2022-11-08 LAB — RAD ONC ARIA SESSION SUMMARY

## 2022-11-08 NOTE — Progress Notes (Signed)
[facility] Clinical Social Work  Clinical Social Work was referred by medical provider for assessment of psychosocial needs.  Clinical Social Worker attempted to contact patient by phone to offer support and assess for needs.  CSW tried calling three times, phone would ring once but voicemail did not pick up.  Unable to leave voicemail.       Joseph Art, LCSW  Clinical Social Worker [facility]

## 2022-11-09 ENCOUNTER — Ambulatory Visit
Admission: RE | Admit: 2022-11-09 | Discharge: 2022-11-09 | Disposition: A | Discharge status: Home / Self Care | Payer: Medicare HMO | Source: Ambulatory Visit | Attending: Radiation Oncology | Admitting: Radiation Oncology

## 2022-11-09 ENCOUNTER — Other Ambulatory Visit: Payer: Self-pay

## 2022-11-09 DIAGNOSIS — Z510 Encounter for antineoplastic radiation therapy: Secondary | ICD-10-CM | POA: Diagnosis not present

## 2022-11-09 LAB — RAD ONC ARIA SESSION SUMMARY

## 2022-11-12 ENCOUNTER — Other Ambulatory Visit: Payer: Self-pay

## 2022-11-12 ENCOUNTER — Ambulatory Visit
Admission: RE | Admit: 2022-11-12 | Discharge: 2022-11-12 | Disposition: A | Discharge status: Home / Self Care | Payer: Medicare HMO | Source: Ambulatory Visit | Attending: Radiation Oncology | Admitting: Radiation Oncology

## 2022-11-12 DIAGNOSIS — Z510 Encounter for antineoplastic radiation therapy: Secondary | ICD-10-CM | POA: Diagnosis not present

## 2022-11-12 LAB — RAD ONC ARIA SESSION SUMMARY
Course Elapsed Days: 4
Plan Fractions Treated to Date: 3
Plan Fractions Treated to Date: 3
Plan Prescribed Dose Per Fraction: 1.8 Gy
Plan Prescribed Dose Per Fraction: 1.8 Gy
Plan Total Fractions Prescribed: 28
Plan Total Fractions Prescribed: 28
Plan Total Prescribed Dose: 50.4 Gy
Plan Total Prescribed Dose: 50.4 Gy
Reference Point Dosage Given to Date: 5.4 Gy
Reference Point Dosage Given to Date: 5.4 Gy
Reference Point Session Dosage Given: 1.8 Gy
Reference Point Session Dosage Given: 1.8 Gy
Session Number: 3

## 2022-11-13 ENCOUNTER — Ambulatory Visit
Admission: RE | Admit: 2022-11-13 | Discharge: 2022-11-13 | Disposition: A | Discharge status: Home / Self Care | Payer: Medicare HMO | Source: Ambulatory Visit | Attending: Radiation Oncology | Admitting: Radiation Oncology

## 2022-11-13 ENCOUNTER — Other Ambulatory Visit: Payer: Self-pay

## 2022-11-13 DIAGNOSIS — Z510 Encounter for antineoplastic radiation therapy: Secondary | ICD-10-CM | POA: Diagnosis not present

## 2022-11-13 LAB — RAD ONC ARIA SESSION SUMMARY

## 2022-11-14 ENCOUNTER — Ambulatory Visit
Admission: RE | Admit: 2022-11-14 | Discharge: 2022-11-14 | Disposition: A | Discharge status: Home / Self Care | Payer: Medicare HMO | Source: Ambulatory Visit | Attending: Radiation Oncology | Admitting: Radiation Oncology

## 2022-11-14 ENCOUNTER — Other Ambulatory Visit: Payer: Self-pay

## 2022-11-14 DIAGNOSIS — Z510 Encounter for antineoplastic radiation therapy: Secondary | ICD-10-CM | POA: Diagnosis not present

## 2022-11-14 LAB — RAD ONC ARIA SESSION SUMMARY

## 2022-11-15 ENCOUNTER — Other Ambulatory Visit: Payer: Self-pay

## 2022-11-15 ENCOUNTER — Ambulatory Visit
Admission: RE | Admit: 2022-11-15 | Discharge: 2022-11-15 | Disposition: A | Discharge status: Home / Self Care | Payer: Medicare HMO | Source: Ambulatory Visit | Attending: Radiation Oncology | Admitting: Radiation Oncology

## 2022-11-15 ENCOUNTER — Inpatient Hospital Stay: Payer: Medicare HMO

## 2022-11-15 DIAGNOSIS — Z510 Encounter for antineoplastic radiation therapy: Secondary | ICD-10-CM | POA: Diagnosis not present

## 2022-11-15 DIAGNOSIS — Z170 Estrogen receptor positive status [ER+]: Secondary | ICD-10-CM | POA: Diagnosis not present

## 2022-11-15 DIAGNOSIS — C50412 Malignant neoplasm of upper-outer quadrant of left female breast: Secondary | ICD-10-CM | POA: Diagnosis present

## 2022-11-15 DIAGNOSIS — C50411 Malignant neoplasm of upper-outer quadrant of right female breast: Secondary | ICD-10-CM | POA: Diagnosis not present

## 2022-11-15 LAB — RAD ONC ARIA SESSION SUMMARY

## 2022-11-15 LAB — CBC (CANCER CENTER ONLY)
HCT: 42.2 % (ref 36.0–46.0)
Hemoglobin: 13.4 g/dL (ref 12.0–15.0)
MCH: 30.9 pg (ref 26.0–34.0)
MCHC: 31.8 g/dL (ref 30.0–36.0)
MCV: 97.5 fL (ref 80.0–100.0)
Platelet Count: 180 10*3/uL (ref 150–400)
RBC: 4.33 MIL/uL (ref 3.87–5.11)
RDW: 13.7 % (ref 11.5–15.5)
WBC Count: 6.7 10*3/uL (ref 4.0–10.5)
nRBC: 0 % (ref 0.0–0.2)

## 2022-11-16 ENCOUNTER — Ambulatory Visit
Admission: RE | Admit: 2022-11-16 | Discharge: 2022-11-16 | Disposition: A | Discharge status: Home / Self Care | Payer: Medicare HMO | Source: Ambulatory Visit | Attending: Radiation Oncology | Admitting: Radiation Oncology

## 2022-11-16 ENCOUNTER — Other Ambulatory Visit: Payer: Self-pay

## 2022-11-16 DIAGNOSIS — Z510 Encounter for antineoplastic radiation therapy: Secondary | ICD-10-CM | POA: Diagnosis not present

## 2022-11-16 LAB — RAD ONC ARIA SESSION SUMMARY

## 2022-11-19 ENCOUNTER — Ambulatory Visit
Admission: RE | Admit: 2022-11-19 | Discharge: 2022-11-19 | Disposition: A | Discharge status: Home / Self Care | Payer: Medicare HMO | Source: Ambulatory Visit | Attending: Radiation Oncology | Admitting: Radiation Oncology

## 2022-11-19 ENCOUNTER — Other Ambulatory Visit: Payer: Self-pay

## 2022-11-19 DIAGNOSIS — Z510 Encounter for antineoplastic radiation therapy: Secondary | ICD-10-CM | POA: Diagnosis not present

## 2022-11-19 LAB — RAD ONC ARIA SESSION SUMMARY
Course Elapsed Days: 11
Plan Fractions Treated to Date: 8
Plan Fractions Treated to Date: 8
Plan Prescribed Dose Per Fraction: 1.8 Gy
Plan Prescribed Dose Per Fraction: 1.8 Gy
Plan Total Fractions Prescribed: 28
Plan Total Fractions Prescribed: 28
Plan Total Prescribed Dose: 50.4 Gy
Plan Total Prescribed Dose: 50.4 Gy
Reference Point Dosage Given to Date: 14.4 Gy
Reference Point Dosage Given to Date: 14.4 Gy
Reference Point Session Dosage Given: 1.8 Gy
Reference Point Session Dosage Given: 1.8 Gy
Session Number: 8

## 2022-11-20 ENCOUNTER — Other Ambulatory Visit: Payer: Self-pay

## 2022-11-20 ENCOUNTER — Ambulatory Visit
Admission: RE | Admit: 2022-11-20 | Discharge: 2022-11-20 | Disposition: A | Discharge status: Home / Self Care | Payer: Medicare HMO | Source: Ambulatory Visit | Attending: Radiation Oncology | Admitting: Radiation Oncology

## 2022-11-20 DIAGNOSIS — Z510 Encounter for antineoplastic radiation therapy: Secondary | ICD-10-CM | POA: Diagnosis not present

## 2022-11-20 LAB — RAD ONC ARIA SESSION SUMMARY
Course Elapsed Days: 12
Plan Fractions Treated to Date: 9
Plan Fractions Treated to Date: 9
Plan Prescribed Dose Per Fraction: 1.8 Gy
Plan Prescribed Dose Per Fraction: 1.8 Gy
Plan Total Fractions Prescribed: 28
Plan Total Fractions Prescribed: 28
Plan Total Prescribed Dose: 50.4 Gy
Plan Total Prescribed Dose: 50.4 Gy
Reference Point Dosage Given to Date: 16.2 Gy
Reference Point Dosage Given to Date: 16.2 Gy
Reference Point Session Dosage Given: 1.8 Gy
Reference Point Session Dosage Given: 1.8 Gy
Session Number: 9

## 2022-11-21 ENCOUNTER — Ambulatory Visit
Admission: RE | Admit: 2022-11-21 | Discharge: 2022-11-21 | Disposition: A | Discharge status: Home / Self Care | Payer: Medicare HMO | Source: Ambulatory Visit | Attending: Radiation Oncology | Admitting: Radiation Oncology

## 2022-11-21 ENCOUNTER — Other Ambulatory Visit: Payer: Self-pay

## 2022-11-21 DIAGNOSIS — Z510 Encounter for antineoplastic radiation therapy: Secondary | ICD-10-CM | POA: Diagnosis not present

## 2022-11-21 LAB — RAD ONC ARIA SESSION SUMMARY

## 2022-11-22 ENCOUNTER — Ambulatory Visit
Admission: RE | Admit: 2022-11-22 | Discharge: 2022-11-22 | Disposition: A | Discharge status: Home / Self Care | Payer: Medicare HMO | Source: Ambulatory Visit | Attending: Radiation Oncology | Admitting: Radiation Oncology

## 2022-11-22 ENCOUNTER — Other Ambulatory Visit: Payer: Self-pay

## 2022-11-22 DIAGNOSIS — Z510 Encounter for antineoplastic radiation therapy: Secondary | ICD-10-CM | POA: Diagnosis not present

## 2022-11-22 LAB — RAD ONC ARIA SESSION SUMMARY

## 2022-11-23 ENCOUNTER — Ambulatory Visit
Admission: RE | Admit: 2022-11-23 | Discharge: 2022-11-23 | Disposition: A | Discharge status: Home / Self Care | Payer: Medicare HMO | Source: Ambulatory Visit | Attending: Radiation Oncology | Admitting: Radiation Oncology

## 2022-11-23 ENCOUNTER — Other Ambulatory Visit: Payer: Self-pay

## 2022-11-23 DIAGNOSIS — Z510 Encounter for antineoplastic radiation therapy: Secondary | ICD-10-CM | POA: Diagnosis not present

## 2022-11-23 LAB — RAD ONC ARIA SESSION SUMMARY

## 2022-11-27 ENCOUNTER — Other Ambulatory Visit: Payer: Self-pay

## 2022-11-27 ENCOUNTER — Ambulatory Visit
Admission: RE | Admit: 2022-11-27 | Discharge: 2022-11-27 | Disposition: A | Discharge status: Home / Self Care | Payer: Medicare HMO | Source: Ambulatory Visit | Attending: Radiation Oncology | Admitting: Radiation Oncology

## 2022-11-27 DIAGNOSIS — Z510 Encounter for antineoplastic radiation therapy: Secondary | ICD-10-CM | POA: Diagnosis not present

## 2022-11-27 LAB — RAD ONC ARIA SESSION SUMMARY
Course Elapsed Days: 19
Plan Fractions Treated to Date: 13
Plan Fractions Treated to Date: 13
Plan Prescribed Dose Per Fraction: 1.8 Gy
Plan Prescribed Dose Per Fraction: 1.8 Gy
Plan Total Fractions Prescribed: 28
Plan Total Fractions Prescribed: 28
Plan Total Prescribed Dose: 50.4 Gy
Plan Total Prescribed Dose: 50.4 Gy
Reference Point Dosage Given to Date: 23.4 Gy
Reference Point Dosage Given to Date: 23.4 Gy
Reference Point Session Dosage Given: 1.8 Gy
Reference Point Session Dosage Given: 1.8 Gy
Session Number: 13

## 2022-11-28 ENCOUNTER — Ambulatory Visit
Admission: RE | Admit: 2022-11-28 | Discharge: 2022-11-28 | Disposition: A | Discharge status: Home / Self Care | Payer: Medicare HMO | Source: Ambulatory Visit | Attending: Radiation Oncology | Admitting: Radiation Oncology

## 2022-11-28 ENCOUNTER — Other Ambulatory Visit: Payer: Self-pay

## 2022-11-28 DIAGNOSIS — Z510 Encounter for antineoplastic radiation therapy: Secondary | ICD-10-CM | POA: Diagnosis not present

## 2022-11-28 LAB — RAD ONC ARIA SESSION SUMMARY
Course Elapsed Days: 20
Plan Fractions Treated to Date: 14
Plan Fractions Treated to Date: 14
Plan Prescribed Dose Per Fraction: 1.8 Gy
Plan Prescribed Dose Per Fraction: 1.8 Gy
Plan Total Fractions Prescribed: 28
Plan Total Fractions Prescribed: 28
Plan Total Prescribed Dose: 50.4 Gy
Plan Total Prescribed Dose: 50.4 Gy
Reference Point Dosage Given to Date: 25.2 Gy
Reference Point Dosage Given to Date: 25.2 Gy
Reference Point Session Dosage Given: 1.8 Gy
Reference Point Session Dosage Given: 1.8 Gy
Session Number: 14

## 2022-11-29 ENCOUNTER — Other Ambulatory Visit: Payer: Self-pay

## 2022-11-29 ENCOUNTER — Inpatient Hospital Stay: Payer: Medicare HMO

## 2022-11-29 ENCOUNTER — Ambulatory Visit
Admission: RE | Admit: 2022-11-29 | Discharge: 2022-11-29 | Disposition: A | Discharge status: Home / Self Care | Payer: Medicare HMO | Source: Ambulatory Visit | Attending: Radiation Oncology | Admitting: Radiation Oncology

## 2022-11-29 DIAGNOSIS — Z170 Estrogen receptor positive status [ER+]: Secondary | ICD-10-CM

## 2022-11-29 DIAGNOSIS — C50412 Malignant neoplasm of upper-outer quadrant of left female breast: Secondary | ICD-10-CM

## 2022-11-29 DIAGNOSIS — Z510 Encounter for antineoplastic radiation therapy: Secondary | ICD-10-CM | POA: Diagnosis not present

## 2022-11-29 DIAGNOSIS — C50411 Malignant neoplasm of upper-outer quadrant of right female breast: Secondary | ICD-10-CM | POA: Diagnosis not present

## 2022-11-29 LAB — RAD ONC ARIA SESSION SUMMARY

## 2022-11-29 LAB — CBC (CANCER CENTER ONLY)
HCT: 40.1 % (ref 36.0–46.0)
Hemoglobin: 12.8 g/dL (ref 12.0–15.0)
MCH: 31 pg (ref 26.0–34.0)
MCHC: 31.9 g/dL (ref 30.0–36.0)
MCV: 97.1 fL (ref 80.0–100.0)
Platelet Count: 144 10*3/uL — ABNORMAL LOW (ref 150–400)
RBC: 4.13 MIL/uL (ref 3.87–5.11)
RDW: 13.7 % (ref 11.5–15.5)
WBC Count: 5.1 10*3/uL (ref 4.0–10.5)
nRBC: 0 % (ref 0.0–0.2)

## 2022-11-30 ENCOUNTER — Ambulatory Visit
Admission: RE | Admit: 2022-11-30 | Discharge: 2022-11-30 | Disposition: A | Discharge status: Home / Self Care | Payer: Medicare HMO | Source: Ambulatory Visit | Attending: Radiation Oncology | Admitting: Radiation Oncology

## 2022-11-30 ENCOUNTER — Other Ambulatory Visit: Payer: Self-pay

## 2022-11-30 DIAGNOSIS — Z510 Encounter for antineoplastic radiation therapy: Secondary | ICD-10-CM | POA: Diagnosis not present

## 2022-11-30 LAB — RAD ONC ARIA SESSION SUMMARY
Course Elapsed Days: 22
Plan Fractions Treated to Date: 16
Plan Fractions Treated to Date: 16
Plan Prescribed Dose Per Fraction: 1.8 Gy
Plan Prescribed Dose Per Fraction: 1.8 Gy
Plan Total Fractions Prescribed: 28
Plan Total Fractions Prescribed: 28
Plan Total Prescribed Dose: 50.4 Gy
Plan Total Prescribed Dose: 50.4 Gy
Reference Point Dosage Given to Date: 28.8 Gy
Reference Point Dosage Given to Date: 28.8 Gy
Reference Point Session Dosage Given: 1.8 Gy
Reference Point Session Dosage Given: 1.8 Gy
Session Number: 16

## 2022-12-03 ENCOUNTER — Ambulatory Visit
Admission: RE | Admit: 2022-12-03 | Discharge: 2022-12-03 | Disposition: A | Discharge status: Home / Self Care | Payer: Medicare HMO | Source: Ambulatory Visit | Attending: Radiation Oncology | Admitting: Radiation Oncology

## 2022-12-03 ENCOUNTER — Other Ambulatory Visit: Payer: Self-pay

## 2022-12-03 DIAGNOSIS — C50412 Malignant neoplasm of upper-outer quadrant of left female breast: Secondary | ICD-10-CM | POA: Insufficient documentation

## 2022-12-03 DIAGNOSIS — Z510 Encounter for antineoplastic radiation therapy: Secondary | ICD-10-CM | POA: Insufficient documentation

## 2022-12-03 DIAGNOSIS — C50411 Malignant neoplasm of upper-outer quadrant of right female breast: Secondary | ICD-10-CM | POA: Insufficient documentation

## 2022-12-03 DIAGNOSIS — Z170 Estrogen receptor positive status [ER+]: Secondary | ICD-10-CM | POA: Diagnosis not present

## 2022-12-03 LAB — RAD ONC ARIA SESSION SUMMARY
Course Elapsed Days: 25
Plan Fractions Treated to Date: 17
Plan Fractions Treated to Date: 17
Plan Prescribed Dose Per Fraction: 1.8 Gy
Plan Prescribed Dose Per Fraction: 1.8 Gy
Plan Total Fractions Prescribed: 28
Plan Total Fractions Prescribed: 28
Plan Total Prescribed Dose: 50.4 Gy
Plan Total Prescribed Dose: 50.4 Gy
Reference Point Dosage Given to Date: 30.6 Gy
Reference Point Dosage Given to Date: 30.6 Gy
Reference Point Session Dosage Given: 1.8 Gy
Reference Point Session Dosage Given: 1.8 Gy
Session Number: 17

## 2022-12-04 ENCOUNTER — Ambulatory Visit
Admission: RE | Admit: 2022-12-04 | Discharge: 2022-12-04 | Disposition: A | Discharge status: Home / Self Care | Payer: Medicare HMO | Source: Ambulatory Visit | Attending: Radiation Oncology | Admitting: Radiation Oncology

## 2022-12-04 ENCOUNTER — Other Ambulatory Visit: Payer: Self-pay

## 2022-12-04 DIAGNOSIS — Z510 Encounter for antineoplastic radiation therapy: Secondary | ICD-10-CM | POA: Diagnosis not present

## 2022-12-04 LAB — RAD ONC ARIA SESSION SUMMARY
Course Elapsed Days: 26
Plan Fractions Treated to Date: 18
Plan Fractions Treated to Date: 18
Plan Prescribed Dose Per Fraction: 1.8 Gy
Plan Prescribed Dose Per Fraction: 1.8 Gy
Plan Total Fractions Prescribed: 28
Plan Total Fractions Prescribed: 28
Plan Total Prescribed Dose: 50.4 Gy
Plan Total Prescribed Dose: 50.4 Gy
Reference Point Dosage Given to Date: 32.4 Gy
Reference Point Dosage Given to Date: 32.4 Gy
Reference Point Session Dosage Given: 1.8 Gy
Reference Point Session Dosage Given: 1.8 Gy
Session Number: 18

## 2022-12-05 ENCOUNTER — Other Ambulatory Visit: Payer: Self-pay

## 2022-12-05 ENCOUNTER — Ambulatory Visit
Admission: RE | Admit: 2022-12-05 | Discharge: 2022-12-05 | Disposition: A | Discharge status: Home / Self Care | Payer: Medicare HMO | Source: Ambulatory Visit | Attending: Radiation Oncology | Admitting: Radiation Oncology

## 2022-12-05 DIAGNOSIS — Z510 Encounter for antineoplastic radiation therapy: Secondary | ICD-10-CM | POA: Diagnosis not present

## 2022-12-05 LAB — RAD ONC ARIA SESSION SUMMARY
Course Elapsed Days: 27
Plan Fractions Treated to Date: 19
Plan Fractions Treated to Date: 19
Plan Prescribed Dose Per Fraction: 1.8 Gy
Plan Prescribed Dose Per Fraction: 1.8 Gy
Plan Total Fractions Prescribed: 28
Plan Total Fractions Prescribed: 28
Plan Total Prescribed Dose: 50.4 Gy
Plan Total Prescribed Dose: 50.4 Gy
Reference Point Dosage Given to Date: 34.2 Gy
Reference Point Dosage Given to Date: 34.2 Gy
Reference Point Session Dosage Given: 1.8 Gy
Reference Point Session Dosage Given: 1.8 Gy
Session Number: 19

## 2022-12-06 ENCOUNTER — Ambulatory Visit
Admission: RE | Admit: 2022-12-06 | Discharge: 2022-12-06 | Disposition: A | Discharge status: Home / Self Care | Payer: Medicare HMO | Source: Ambulatory Visit | Attending: Radiation Oncology | Admitting: Radiation Oncology

## 2022-12-06 ENCOUNTER — Other Ambulatory Visit: Payer: Self-pay

## 2022-12-06 DIAGNOSIS — Z510 Encounter for antineoplastic radiation therapy: Secondary | ICD-10-CM | POA: Diagnosis not present

## 2022-12-06 LAB — RAD ONC ARIA SESSION SUMMARY
Course Elapsed Days: 28
Plan Fractions Treated to Date: 20
Plan Fractions Treated to Date: 20
Plan Prescribed Dose Per Fraction: 1.8 Gy
Plan Prescribed Dose Per Fraction: 1.8 Gy
Plan Total Fractions Prescribed: 28
Plan Total Fractions Prescribed: 28
Plan Total Prescribed Dose: 50.4 Gy
Plan Total Prescribed Dose: 50.4 Gy
Reference Point Dosage Given to Date: 36 Gy
Reference Point Dosage Given to Date: 36 Gy
Reference Point Session Dosage Given: 1.8 Gy
Reference Point Session Dosage Given: 1.8 Gy
Session Number: 20

## 2022-12-07 ENCOUNTER — Ambulatory Visit
Admission: RE | Admit: 2022-12-07 | Discharge: 2022-12-07 | Disposition: A | Discharge status: Home / Self Care | Payer: Medicare HMO | Source: Ambulatory Visit | Attending: Radiation Oncology | Admitting: Radiation Oncology

## 2022-12-07 ENCOUNTER — Other Ambulatory Visit: Payer: Self-pay

## 2022-12-07 DIAGNOSIS — Z510 Encounter for antineoplastic radiation therapy: Secondary | ICD-10-CM | POA: Diagnosis not present

## 2022-12-07 LAB — RAD ONC ARIA SESSION SUMMARY
Course Elapsed Days: 29
Plan Fractions Treated to Date: 21
Plan Fractions Treated to Date: 21
Plan Prescribed Dose Per Fraction: 1.8 Gy
Plan Prescribed Dose Per Fraction: 1.8 Gy
Plan Total Fractions Prescribed: 28
Plan Total Fractions Prescribed: 28
Plan Total Prescribed Dose: 50.4 Gy
Plan Total Prescribed Dose: 50.4 Gy
Reference Point Dosage Given to Date: 37.8 Gy
Reference Point Dosage Given to Date: 37.8 Gy
Reference Point Session Dosage Given: 1.8 Gy
Reference Point Session Dosage Given: 1.8 Gy
Session Number: 21

## 2022-12-10 ENCOUNTER — Ambulatory Visit
Admission: RE | Admit: 2022-12-10 | Discharge: 2022-12-10 | Disposition: A | Discharge status: Home / Self Care | Payer: Medicare HMO | Source: Ambulatory Visit | Attending: Radiation Oncology | Admitting: Radiation Oncology

## 2022-12-10 ENCOUNTER — Other Ambulatory Visit: Payer: Self-pay

## 2022-12-10 DIAGNOSIS — Z510 Encounter for antineoplastic radiation therapy: Secondary | ICD-10-CM | POA: Diagnosis not present

## 2022-12-10 LAB — RAD ONC ARIA SESSION SUMMARY
Course Elapsed Days: 32
Plan Fractions Treated to Date: 22
Plan Fractions Treated to Date: 22
Plan Prescribed Dose Per Fraction: 1.8 Gy
Plan Prescribed Dose Per Fraction: 1.8 Gy
Plan Total Fractions Prescribed: 28
Plan Total Fractions Prescribed: 28
Plan Total Prescribed Dose: 50.4 Gy
Plan Total Prescribed Dose: 50.4 Gy
Reference Point Dosage Given to Date: 39.6 Gy
Reference Point Dosage Given to Date: 39.6 Gy
Reference Point Session Dosage Given: 1.8 Gy
Reference Point Session Dosage Given: 1.8 Gy
Session Number: 22

## 2022-12-11 ENCOUNTER — Ambulatory Visit
Admission: RE | Admit: 2022-12-11 | Discharge: 2022-12-11 | Disposition: A | Discharge status: Home / Self Care | Payer: Medicare HMO | Source: Ambulatory Visit | Attending: Radiation Oncology | Admitting: Radiation Oncology

## 2022-12-11 ENCOUNTER — Other Ambulatory Visit: Payer: Self-pay

## 2022-12-11 DIAGNOSIS — Z510 Encounter for antineoplastic radiation therapy: Secondary | ICD-10-CM | POA: Diagnosis not present

## 2022-12-11 LAB — RAD ONC ARIA SESSION SUMMARY
Course Elapsed Days: 33
Plan Fractions Treated to Date: 23
Plan Fractions Treated to Date: 23
Plan Prescribed Dose Per Fraction: 1.8 Gy
Plan Prescribed Dose Per Fraction: 1.8 Gy
Plan Total Fractions Prescribed: 28
Plan Total Fractions Prescribed: 28
Plan Total Prescribed Dose: 50.4 Gy
Plan Total Prescribed Dose: 50.4 Gy
Reference Point Dosage Given to Date: 41.4 Gy
Reference Point Dosage Given to Date: 41.4 Gy
Reference Point Session Dosage Given: 1.8 Gy
Reference Point Session Dosage Given: 1.8 Gy
Session Number: 23

## 2022-12-12 ENCOUNTER — Other Ambulatory Visit: Payer: Self-pay

## 2022-12-12 ENCOUNTER — Ambulatory Visit
Admission: RE | Admit: 2022-12-12 | Discharge: 2022-12-12 | Disposition: A | Discharge status: Home / Self Care | Payer: Medicare HMO | Source: Ambulatory Visit | Attending: Radiation Oncology | Admitting: Radiation Oncology

## 2022-12-12 DIAGNOSIS — Z510 Encounter for antineoplastic radiation therapy: Secondary | ICD-10-CM | POA: Diagnosis not present

## 2022-12-12 LAB — RAD ONC ARIA SESSION SUMMARY
Course Elapsed Days: 34
Plan Fractions Treated to Date: 24
Plan Fractions Treated to Date: 24
Plan Prescribed Dose Per Fraction: 1.8 Gy
Plan Prescribed Dose Per Fraction: 1.8 Gy
Plan Total Fractions Prescribed: 28
Plan Total Fractions Prescribed: 28
Plan Total Prescribed Dose: 50.4 Gy
Plan Total Prescribed Dose: 50.4 Gy
Reference Point Dosage Given to Date: 43.2 Gy
Reference Point Dosage Given to Date: 43.2 Gy
Reference Point Session Dosage Given: 1.8 Gy
Reference Point Session Dosage Given: 1.8 Gy
Session Number: 24

## 2022-12-13 ENCOUNTER — Other Ambulatory Visit: Payer: Self-pay

## 2022-12-13 ENCOUNTER — Inpatient Hospital Stay: Payer: Medicare HMO | Attending: Oncology

## 2022-12-13 ENCOUNTER — Ambulatory Visit
Admission: RE | Admit: 2022-12-13 | Discharge: 2022-12-13 | Disposition: A | Discharge status: Home / Self Care | Payer: Medicare HMO | Source: Ambulatory Visit | Attending: Radiation Oncology | Admitting: Radiation Oncology

## 2022-12-13 DIAGNOSIS — Z170 Estrogen receptor positive status [ER+]: Secondary | ICD-10-CM | POA: Diagnosis not present

## 2022-12-13 DIAGNOSIS — C50411 Malignant neoplasm of upper-outer quadrant of right female breast: Secondary | ICD-10-CM | POA: Insufficient documentation

## 2022-12-13 DIAGNOSIS — C50412 Malignant neoplasm of upper-outer quadrant of left female breast: Secondary | ICD-10-CM | POA: Diagnosis present

## 2022-12-13 DIAGNOSIS — Z510 Encounter for antineoplastic radiation therapy: Secondary | ICD-10-CM | POA: Diagnosis not present

## 2022-12-13 LAB — CBC (CANCER CENTER ONLY)
HCT: 41.6 % (ref 36.0–46.0)
Hemoglobin: 13.3 g/dL (ref 12.0–15.0)
MCH: 31.1 pg (ref 26.0–34.0)
MCHC: 32 g/dL (ref 30.0–36.0)
MCV: 97.4 fL (ref 80.0–100.0)
Platelet Count: 148 10*3/uL — ABNORMAL LOW (ref 150–400)
RBC: 4.27 MIL/uL (ref 3.87–5.11)
RDW: 13.9 % (ref 11.5–15.5)
WBC Count: 5.2 10*3/uL (ref 4.0–10.5)
nRBC: 0 % (ref 0.0–0.2)

## 2022-12-13 LAB — RAD ONC ARIA SESSION SUMMARY
Course Elapsed Days: 35
Plan Fractions Treated to Date: 25
Plan Fractions Treated to Date: 25
Plan Prescribed Dose Per Fraction: 1.8 Gy
Plan Prescribed Dose Per Fraction: 1.8 Gy
Plan Total Fractions Prescribed: 28
Plan Total Fractions Prescribed: 28
Plan Total Prescribed Dose: 50.4 Gy
Plan Total Prescribed Dose: 50.4 Gy
Reference Point Dosage Given to Date: 45 Gy
Reference Point Dosage Given to Date: 45 Gy
Reference Point Session Dosage Given: 1.8 Gy
Reference Point Session Dosage Given: 1.8 Gy
Session Number: 25

## 2022-12-14 ENCOUNTER — Other Ambulatory Visit: Payer: Self-pay

## 2022-12-14 ENCOUNTER — Ambulatory Visit
Admission: RE | Admit: 2022-12-14 | Discharge: 2022-12-14 | Disposition: A | Discharge status: Home / Self Care | Payer: Medicare HMO | Source: Ambulatory Visit | Attending: Radiation Oncology | Admitting: Radiation Oncology

## 2022-12-14 DIAGNOSIS — Z510 Encounter for antineoplastic radiation therapy: Secondary | ICD-10-CM | POA: Diagnosis not present

## 2022-12-14 LAB — RAD ONC ARIA SESSION SUMMARY
Course Elapsed Days: 36
Plan Fractions Treated to Date: 26
Plan Fractions Treated to Date: 26
Plan Prescribed Dose Per Fraction: 1.8 Gy
Plan Prescribed Dose Per Fraction: 1.8 Gy
Plan Total Fractions Prescribed: 28
Plan Total Fractions Prescribed: 28
Plan Total Prescribed Dose: 50.4 Gy
Plan Total Prescribed Dose: 50.4 Gy
Reference Point Dosage Given to Date: 46.8 Gy
Reference Point Dosage Given to Date: 46.8 Gy
Reference Point Session Dosage Given: 1.8 Gy
Reference Point Session Dosage Given: 1.8 Gy
Session Number: 26

## 2022-12-17 ENCOUNTER — Ambulatory Visit
Admission: RE | Admit: 2022-12-17 | Discharge: 2022-12-17 | Disposition: A | Discharge status: Home / Self Care | Payer: Medicare HMO | Source: Ambulatory Visit | Attending: Radiation Oncology | Admitting: Radiation Oncology

## 2022-12-17 ENCOUNTER — Other Ambulatory Visit: Payer: Self-pay

## 2022-12-17 DIAGNOSIS — Z510 Encounter for antineoplastic radiation therapy: Secondary | ICD-10-CM | POA: Diagnosis not present

## 2022-12-17 LAB — RAD ONC ARIA SESSION SUMMARY
Course Elapsed Days: 39
Plan Fractions Treated to Date: 27
Plan Fractions Treated to Date: 27
Plan Prescribed Dose Per Fraction: 1.8 Gy
Plan Prescribed Dose Per Fraction: 1.8 Gy
Plan Total Fractions Prescribed: 28
Plan Total Fractions Prescribed: 28
Plan Total Prescribed Dose: 50.4 Gy
Plan Total Prescribed Dose: 50.4 Gy
Reference Point Dosage Given to Date: 48.6 Gy
Reference Point Dosage Given to Date: 48.6 Gy
Reference Point Session Dosage Given: 1.8 Gy
Reference Point Session Dosage Given: 1.8 Gy
Session Number: 27

## 2022-12-18 ENCOUNTER — Other Ambulatory Visit: Payer: Self-pay | Admitting: *Deleted

## 2022-12-18 ENCOUNTER — Other Ambulatory Visit: Payer: Self-pay

## 2022-12-18 ENCOUNTER — Ambulatory Visit
Admission: RE | Admit: 2022-12-18 | Discharge: 2022-12-18 | Disposition: A | Discharge status: Home / Self Care | Payer: Medicare HMO | Source: Ambulatory Visit | Attending: Radiation Oncology | Admitting: Radiation Oncology

## 2022-12-18 ENCOUNTER — Ambulatory Visit: Admission: RE | Admit: 2022-12-18 | Payer: Medicare HMO | Source: Ambulatory Visit

## 2022-12-18 DIAGNOSIS — Z510 Encounter for antineoplastic radiation therapy: Secondary | ICD-10-CM | POA: Diagnosis not present

## 2022-12-18 LAB — RAD ONC ARIA SESSION SUMMARY
Course Elapsed Days: 40
Plan Fractions Treated to Date: 28
Plan Fractions Treated to Date: 28
Plan Prescribed Dose Per Fraction: 1.8 Gy
Plan Prescribed Dose Per Fraction: 1.8 Gy
Plan Total Fractions Prescribed: 28
Plan Total Fractions Prescribed: 28
Plan Total Prescribed Dose: 50.4 Gy
Plan Total Prescribed Dose: 50.4 Gy
Reference Point Dosage Given to Date: 50.4 Gy
Reference Point Dosage Given to Date: 50.4 Gy
Reference Point Session Dosage Given: 1.8 Gy
Reference Point Session Dosage Given: 1.8 Gy
Session Number: 28

## 2022-12-18 MED ORDER — SILVER SULFADIAZINE 1 % EX CREA: 1.0000 | TOPICAL_CREAM | Freq: Every day | CUTANEOUS | 1 refills | Status: DC

## 2022-12-19 ENCOUNTER — Ambulatory Visit
Admission: RE | Admit: 2022-12-19 | Discharge: 2022-12-19 | Disposition: A | Discharge status: Home / Self Care | Payer: Medicare HMO | Source: Ambulatory Visit | Attending: Radiation Oncology | Admitting: Radiation Oncology

## 2022-12-19 ENCOUNTER — Other Ambulatory Visit: Payer: Self-pay

## 2022-12-19 DIAGNOSIS — Z510 Encounter for antineoplastic radiation therapy: Secondary | ICD-10-CM | POA: Diagnosis not present

## 2022-12-19 LAB — RAD ONC ARIA SESSION SUMMARY
Course Elapsed Days: 41
Plan Fractions Treated to Date: 1
Plan Fractions Treated to Date: 1
Plan Prescribed Dose Per Fraction: 2 Gy
Plan Prescribed Dose Per Fraction: 2 Gy
Plan Total Fractions Prescribed: 5
Plan Total Fractions Prescribed: 5
Plan Total Prescribed Dose: 10 Gy
Plan Total Prescribed Dose: 10 Gy
Reference Point Dosage Given to Date: 2 Gy
Reference Point Dosage Given to Date: 2 Gy
Reference Point Session Dosage Given: 2 Gy
Reference Point Session Dosage Given: 2 Gy
Session Number: 29

## 2022-12-20 ENCOUNTER — Ambulatory Visit
Admission: RE | Admit: 2022-12-20 | Discharge: 2022-12-20 | Disposition: A | Discharge status: Home / Self Care | Payer: Medicare HMO | Source: Ambulatory Visit | Attending: Radiation Oncology | Admitting: Radiation Oncology

## 2022-12-20 ENCOUNTER — Other Ambulatory Visit: Payer: Self-pay

## 2022-12-20 DIAGNOSIS — Z510 Encounter for antineoplastic radiation therapy: Secondary | ICD-10-CM | POA: Diagnosis not present

## 2022-12-20 LAB — RAD ONC ARIA SESSION SUMMARY
Course Elapsed Days: 42
Plan Fractions Treated to Date: 2
Plan Fractions Treated to Date: 2
Plan Prescribed Dose Per Fraction: 2 Gy
Plan Prescribed Dose Per Fraction: 2 Gy
Plan Total Fractions Prescribed: 5
Plan Total Fractions Prescribed: 5
Plan Total Prescribed Dose: 10 Gy
Plan Total Prescribed Dose: 10 Gy
Reference Point Dosage Given to Date: 4 Gy
Reference Point Dosage Given to Date: 4 Gy
Reference Point Session Dosage Given: 2 Gy
Reference Point Session Dosage Given: 2 Gy
Session Number: 30

## 2022-12-21 ENCOUNTER — Other Ambulatory Visit: Payer: Self-pay

## 2022-12-21 ENCOUNTER — Ambulatory Visit
Admission: RE | Admit: 2022-12-21 | Discharge: 2022-12-21 | Disposition: A | Discharge status: Home / Self Care | Payer: Medicare HMO | Source: Ambulatory Visit | Attending: Radiation Oncology | Admitting: Radiation Oncology

## 2022-12-21 DIAGNOSIS — Z510 Encounter for antineoplastic radiation therapy: Secondary | ICD-10-CM | POA: Diagnosis not present

## 2022-12-21 LAB — RAD ONC ARIA SESSION SUMMARY
Course Elapsed Days: 43
Plan Fractions Treated to Date: 3
Plan Fractions Treated to Date: 3
Plan Prescribed Dose Per Fraction: 2 Gy
Plan Prescribed Dose Per Fraction: 2 Gy
Plan Total Fractions Prescribed: 5
Plan Total Fractions Prescribed: 5
Plan Total Prescribed Dose: 10 Gy
Plan Total Prescribed Dose: 10 Gy
Reference Point Dosage Given to Date: 6 Gy
Reference Point Dosage Given to Date: 6 Gy
Reference Point Session Dosage Given: 2 Gy
Reference Point Session Dosage Given: 2 Gy
Session Number: 31

## 2022-12-24 ENCOUNTER — Other Ambulatory Visit: Payer: Self-pay

## 2022-12-24 ENCOUNTER — Ambulatory Visit
Admission: RE | Admit: 2022-12-24 | Discharge: 2022-12-24 | Disposition: A | Discharge status: Home / Self Care | Payer: Medicare HMO | Source: Ambulatory Visit | Attending: Radiation Oncology | Admitting: Radiation Oncology

## 2022-12-24 DIAGNOSIS — Z510 Encounter for antineoplastic radiation therapy: Secondary | ICD-10-CM | POA: Diagnosis not present

## 2022-12-24 LAB — RAD ONC ARIA SESSION SUMMARY
Course Elapsed Days: 46
Plan Fractions Treated to Date: 4
Plan Fractions Treated to Date: 4
Plan Prescribed Dose Per Fraction: 2 Gy
Plan Prescribed Dose Per Fraction: 2 Gy
Plan Total Fractions Prescribed: 5
Plan Total Fractions Prescribed: 5
Plan Total Prescribed Dose: 10 Gy
Plan Total Prescribed Dose: 10 Gy
Reference Point Dosage Given to Date: 8 Gy
Reference Point Dosage Given to Date: 8 Gy
Reference Point Session Dosage Given: 2 Gy
Reference Point Session Dosage Given: 2 Gy
Session Number: 32

## 2022-12-25 ENCOUNTER — Other Ambulatory Visit: Payer: Self-pay

## 2022-12-25 ENCOUNTER — Ambulatory Visit
Admission: RE | Admit: 2022-12-25 | Discharge: 2022-12-25 | Disposition: A | Discharge status: Home / Self Care | Payer: Medicare HMO | Source: Ambulatory Visit | Attending: Radiation Oncology | Admitting: Radiation Oncology

## 2022-12-25 ENCOUNTER — Encounter: Payer: Self-pay | Admitting: *Deleted

## 2022-12-25 DIAGNOSIS — Z510 Encounter for antineoplastic radiation therapy: Secondary | ICD-10-CM | POA: Diagnosis not present

## 2022-12-25 LAB — RAD ONC ARIA SESSION SUMMARY
Course Elapsed Days: 47
Plan Fractions Treated to Date: 5
Plan Fractions Treated to Date: 5
Plan Prescribed Dose Per Fraction: 2 Gy
Plan Prescribed Dose Per Fraction: 2 Gy
Plan Total Fractions Prescribed: 5
Plan Total Fractions Prescribed: 5
Plan Total Prescribed Dose: 10 Gy
Plan Total Prescribed Dose: 10 Gy
Reference Point Dosage Given to Date: 10 Gy
Reference Point Dosage Given to Date: 10 Gy
Reference Point Session Dosage Given: 2 Gy
Reference Point Session Dosage Given: 2 Gy
Session Number: 33

## 2022-12-27 ENCOUNTER — Inpatient Hospital Stay: Payer: Medicare HMO

## 2022-12-31 ENCOUNTER — Ambulatory Visit: Payer: Medicare HMO | Attending: General Surgery | Admitting: Rehabilitation

## 2022-12-31 DIAGNOSIS — Z483 Aftercare following surgery for neoplasm: Secondary | ICD-10-CM | POA: Insufficient documentation

## 2022-12-31 DIAGNOSIS — M25511 Pain in right shoulder: Secondary | ICD-10-CM | POA: Insufficient documentation

## 2022-12-31 DIAGNOSIS — Z9189 Other specified personal risk factors, not elsewhere classified: Secondary | ICD-10-CM | POA: Insufficient documentation

## 2022-12-31 DIAGNOSIS — Z170 Estrogen receptor positive status [ER+]: Secondary | ICD-10-CM | POA: Insufficient documentation

## 2022-12-31 DIAGNOSIS — C50411 Malignant neoplasm of upper-outer quadrant of right female breast: Secondary | ICD-10-CM | POA: Insufficient documentation

## 2022-12-31 DIAGNOSIS — G8929 Other chronic pain: Secondary | ICD-10-CM | POA: Insufficient documentation

## 2023-01-22 ENCOUNTER — Inpatient Hospital Stay: Payer: Medicare HMO | Admitting: Oncology

## 2023-01-22 ENCOUNTER — Inpatient Hospital Stay: Payer: Medicare HMO | Attending: Oncology

## 2023-01-22 ENCOUNTER — Encounter: Payer: Self-pay | Admitting: Oncology

## 2023-01-22 ENCOUNTER — Other Ambulatory Visit: Payer: Self-pay | Admitting: Oncology

## 2023-01-22 VITALS — BP 140/86 | HR 62 | Temp 96.9°F | Resp 18 | Ht 60.0 in | Wt 207.8 lb

## 2023-01-22 DIAGNOSIS — Z170 Estrogen receptor positive status [ER+]: Secondary | ICD-10-CM

## 2023-01-22 DIAGNOSIS — C50412 Malignant neoplasm of upper-outer quadrant of left female breast: Secondary | ICD-10-CM | POA: Diagnosis not present

## 2023-01-22 DIAGNOSIS — Z87891 Personal history of nicotine dependence: Secondary | ICD-10-CM | POA: Insufficient documentation

## 2023-01-22 DIAGNOSIS — C50411 Malignant neoplasm of upper-outer quadrant of right female breast: Secondary | ICD-10-CM

## 2023-01-22 DIAGNOSIS — Z79899 Other long term (current) drug therapy: Secondary | ICD-10-CM | POA: Diagnosis not present

## 2023-01-22 DIAGNOSIS — L598 Other specified disorders of the skin and subcutaneous tissue related to radiation: Secondary | ICD-10-CM | POA: Diagnosis not present

## 2023-01-22 DIAGNOSIS — Z808 Family history of malignant neoplasm of other organs or systems: Secondary | ICD-10-CM | POA: Insufficient documentation

## 2023-01-22 DIAGNOSIS — Z885 Allergy status to narcotic agent status: Secondary | ICD-10-CM | POA: Insufficient documentation

## 2023-01-22 DIAGNOSIS — Z9049 Acquired absence of other specified parts of digestive tract: Secondary | ICD-10-CM | POA: Diagnosis not present

## 2023-01-22 DIAGNOSIS — Z803 Family history of malignant neoplasm of breast: Secondary | ICD-10-CM | POA: Diagnosis not present

## 2023-01-22 DIAGNOSIS — Z825 Family history of asthma and other chronic lower respiratory diseases: Secondary | ICD-10-CM | POA: Diagnosis not present

## 2023-01-22 DIAGNOSIS — Z923 Personal history of irradiation: Secondary | ICD-10-CM | POA: Insufficient documentation

## 2023-01-22 DIAGNOSIS — Z8249 Family history of ischemic heart disease and other diseases of the circulatory system: Secondary | ICD-10-CM | POA: Insufficient documentation

## 2023-01-22 DIAGNOSIS — Z881 Allergy status to other antibiotic agents status: Secondary | ICD-10-CM | POA: Insufficient documentation

## 2023-01-22 DIAGNOSIS — Z800 Family history of malignant neoplasm of digestive organs: Secondary | ICD-10-CM | POA: Diagnosis not present

## 2023-01-22 DIAGNOSIS — R5383 Other fatigue: Secondary | ICD-10-CM | POA: Diagnosis not present

## 2023-01-22 DIAGNOSIS — Z833 Family history of diabetes mellitus: Secondary | ICD-10-CM | POA: Insufficient documentation

## 2023-01-22 DIAGNOSIS — Z91041 Radiographic dye allergy status: Secondary | ICD-10-CM | POA: Diagnosis not present

## 2023-01-22 DIAGNOSIS — Z801 Family history of malignant neoplasm of trachea, bronchus and lung: Secondary | ICD-10-CM | POA: Insufficient documentation

## 2023-01-22 DIAGNOSIS — Z79811 Long term (current) use of aromatase inhibitors: Secondary | ICD-10-CM | POA: Insufficient documentation

## 2023-01-22 DIAGNOSIS — Z90710 Acquired absence of both cervix and uterus: Secondary | ICD-10-CM | POA: Diagnosis not present

## 2023-01-22 DIAGNOSIS — Y842 Radiological procedure and radiotherapy as the cause of abnormal reaction of the patient, or of later complication, without mention of misadventure at the time of the procedure: Secondary | ICD-10-CM | POA: Diagnosis not present

## 2023-01-22 LAB — COMPREHENSIVE METABOLIC PANEL
ALT: 13 U/L (ref 0–44)
AST: 19 U/L (ref 15–41)
Albumin: 3.7 g/dL (ref 3.5–5.0)
Alkaline Phosphatase: 59 U/L (ref 38–126)
Anion gap: 8 (ref 5–15)
BUN: 30 mg/dL — ABNORMAL HIGH (ref 8–23)
CO2: 25 mmol/L (ref 22–32)
Calcium: 9 mg/dL (ref 8.9–10.3)
Chloride: 105 mmol/L (ref 98–111)
Creatinine, Ser: 1.05 mg/dL — ABNORMAL HIGH (ref 0.44–1.00)
GFR, Estimated: 53 mL/min — ABNORMAL LOW (ref >60)
Glucose, Bld: 110 mg/dL — ABNORMAL HIGH (ref 70–99)
Potassium: 4.6 mmol/L (ref 3.5–5.1)
Sodium: 138 mmol/L (ref 135–145)
Total Bilirubin: 0.4 mg/dL (ref 0.3–1.2)
Total Protein: 6.9 g/dL (ref 6.5–8.1)

## 2023-01-22 NOTE — Progress Notes (Signed)
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  She self palpated a right breast mass which led to a bilateral diagnostic mammogram.  This showed a 1.6 cm mass in the 10 o'clock position of the right breast and a 4 mm irregular mass in the outer upper quadrant of the left breast.  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  Left breast biopsy showed grade 1 invasive mammary carcinoma with tubular features ER/PR and HER2 pending.   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  She is independent of her ADLs and IADLs.  No prior history of breast cancer although she has had a left lumpectomy for benign lesion many years ago.  Family history of breast cancer in her mother at the age of 68.   Patient underwent bilateral lumpectomy with sentinel lymph node biopsy.  Right breast pathology from April 2024 showed 3.2 cm grade 2 invasive mammary carcinoma with negative margins.  1 sentinel lymph node positive for malignancy.  Ki-67 15%.  Left breast lumpectomy pathology showed 0.8 cm grade 1 invasive mammary carcinoma with tubular features.  Ki-67 5%.  Both tumors were strongly ER/PR positive HER2 negative.  Oncotype testing around 3.2 cm tumor came back with a recurrence score of 16.  Patient therefore does not require any adjuvant chemotherapy for her age.  Patient  has completed adjuvant radiation therapy.  Baseline bone density scan normal  Interval history-patient recently completed adjuvant radiation therapy.  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  She has not started taking letrozole yet.  ECOG PS- 1 Pain scale- 0   Review of systems- Review of Systems  Constitutional:  Positive for malaise/fatigue. Negative for chills, fever and weight loss.  HENT:  Negative for congestion, ear discharge and nosebleeds.   Eyes:  Negative for blurred vision.  Respiratory:  Negative for cough, hemoptysis, sputum production, shortness of breath and wheezing.   Cardiovascular:  Negative for chest pain, palpitations, orthopnea and claudication.  Gastrointestinal:  Negative for abdominal pain, blood in stool, constipation, diarrhea, heartburn, melena, nausea and vomiting.  Genitourinary:  Negative for dysuria, flank pain, frequency, hematuria and urgency.  Musculoskeletal:  Negative for back pain, joint pain and myalgias.  Skin:  Negative for rash.  Neurological:  Negative for dizziness, tingling, focal weakness, seizures, weakness and headaches.  Endo/Heme/Allergies:  Does not bruise/bleed easily.  Psychiatric/Behavioral:  Negative for depression and suicidal ideas. The patient does not have insomnia.       Allergies  Allergen Reactions   Ivp Dye [Iodinated Contrast Media] Anaphylaxis    Topical betadine is ok.   Morphine And Codeine Anaphylaxis    Respiratory Arrest   Shellfish Allergy Anaphylaxis   Dilaudid [Hydromorphone Hcl]     BP bottomed out   Macrodantin [Nitrofurantoin] Nausea And Vomiting    And diarrhea   Nubain [Nalbuphine Hcl] Other (See Comments)  BP "bottoms out"     Past Medical History:  Diagnosis Date   Arthritis    hands, feet, "all over"   CHF (congestive heart failure) (HCC)    acute, after use of Redux   Complication of anesthesia    on vent after colostomy revision   COPD (chronic  obstructive pulmonary disease) (HCC)    chronic bronchitis - no meds   Diabetes mellitus without complication (HCC)    Diverticulitis    Full dentures    upper and lower   GERD (gastroesophageal reflux disease)    Gout    Hypertension    Hypothyroidism    Lichen sclerosus    Vaginal Area   Mitral regurgitation    after use of redux   Right rotator cuff tendonitis    Wears contact lenses      Past Surgical History:  Procedure Laterality Date   ABDOMINAL HYSTERECTOMY  1967   Partial   APPLICATION OF WOUND VAC  02/14/2015   Procedure: APPLICATION OF WOUND VAC;  Surgeon: [name], MD;  Location: [facility];  Service: General;;   BREAST BIOPSY Right 09/12/2022   u/s 10:00 coil path pending   BREAST BIOPSY Left 09/12/2022   2:00 venus [place] bx path pending   breast biopsy Left 09/12/2022   [place] bx mass 2:00 venus marker, path pending   BREAST BIOPSY Left 09/12/2022   [place] LT BREAST BX W LOC DEV 1ST LESION IMG BX SPEC US GUIDE 09/12/2022 [facility]   BREAST BIOPSY Right 09/12/2022   [place] RT BREAST BX W LOC DEV 1ST LESION IMG BX SPEC US GUIDE 09/12/2022 [facility]   BREAST BIOPSY  10/02/2022   MM LT RADIOACTIVE SEED LOC MAMMO GUIDE 10/02/2022 [facility]   BREAST BIOPSY  10/02/2022   MM RT RADIOACTIVE SEED LOC MAMMO GUIDE 10/02/2022 [facility]   BREAST EXCISIONAL BIOPSY Left 1974   benign   BREAST LUMPECTOMY WITH RADIOACTIVE SEED AND SENTINEL LYMPH NODE BIOPSY Right 10/02/2022   Procedure: RIGHT BREAST LUMPECTOMY WITH RADIOACTIVE SEED AND RIGHT AXILLARY SENTINEL LYMPH NODE BIOPSY;  Surgeon: Emelia Loron, MD;  Location: [facility];  Service: General;  Laterality: Right;   BREAST LUMPECTOMY WITH RADIOACTIVE SEED LOCALIZATION Left 10/02/2022   Procedure: LEFT BREAST LUMPECTOMY WITH RADIOACTIVE SEED LOCALIZATION;  Surgeon: Emelia Loron, MD;  Location: [facility];  Service: General;  Laterality: Left;   CHOLECYSTECTOMY     COLECTOMY WITH  COLOSTOMY CREATION/HARTMANN PROCEDURE  07/03/2014   COLONOSCOPY N/A 11/25/2014   Procedure: COLONOSCOPY;  Surgeon: Midge Minium, MD;  Location: [facility];  Service: Gastroenterology;  Laterality: N/A;   COLOSTOMY TAKEDOWN N/A 01/31/2015   Procedure: COLOSTOMY TAKEDOWN, partial colectomy;  Surgeon: [name], MD;  Location: [facility];  Service: General;  Laterality: N/A;   CORRECTION OVERLAPPING TOES Bilateral 2012   bilateral hammertoes #2 & #3 both feet   HERNIA REPAIR  07/02/2013   JOINT REPLACEMENT Right 2006   knee   JOINT REPLACEMENT Left 2008   knee   KIDNEY SURGERY Right    LAPAROTOMY     construct new colostomy and place wound VAC   LAPAROTOMY N/A 02/14/2015   Procedure: RE-OPEN EXPLORATORY LAPAROTOMY, COMPLEX LYSIS OF ADHESIONS, CONSTRUCTION OF LOOP ILEOSTOMY,;  Surgeon: [name], MD;  Location: [facility];  Service: General;  Laterality: N/A;   POLYPECTOMY  11/25/2014   Procedure: POLYPECTOMY INTESTINAL;  Surgeon: Midge Minium, MD;  Location: [facility];  Service: Gastroenterology;;   TONSILLECTOMY  Social History   Socioeconomic History   Marital status: Widowed    Spouse name: Not on file   Number of children: Not on file   Years of education: Not on file   Highest education level: Not on file  Occupational History   Not on file  Tobacco Use   Smoking status: Former    Current packs/day: 0.00    Types: Cigarettes    Quit date: 07/03/1999    Years since quitting: 23.5   Smokeless tobacco: Never  Substance and Sexual Activity   Alcohol use: No   Drug use: No   Sexual activity: Never    Birth control/protection: None  Other Topics Concern   Not on file  Social History Narrative   Not on file   Social Determinants of Health   Financial Resource Strain: Low Risk  (02/20/2022)   Received from [facility], [facility]   Overall Financial Resource Strain (CARDIA)    Difficulty of Paying Living Expenses: Not  hard at all  Food Insecurity: No Food Insecurity (02/20/2022)   Received from [facility], [facility]   Hunger Vital Sign    Worried About Running Out of Food in the Last Year: Never true    Ran Out of Food in the Last Year: Never true  Transportation Needs: No Transportation Needs (02/20/2022)   Received from [facility], [facility]   [facility] - Transportation    In the past 12 months, has lack of transportation kept you from medical appointments or from getting medications?: No    Lack of Transportation (Non-Medical): No  Physical Activity: Not on file  Stress: Not on file  Social Connections: Not on file  Intimate Partner Violence: Not on file    Family History  Problem Relation Age of Onset   Hypertension Mother    COPD Mother    Heart disease Mother    Breast cancer Mother 101   Heart disease Father    COPD Father    Brain cancer Sister 7       tumor   Lung cancer Brother 42   Colon cancer Paternal Aunt        d. 46s   Juvenile Diabetes Son      Current Outpatient Medications:    allopurinol (ZYLOPRIM) 100 MG tablet, Take 100 mg by mouth daily. AM, Disp: , Rfl:    Calcium Carb-Cholecalciferol (CALCIUM 600 + D PO), Take 600 mg elemental calcium/kg/hr by mouth daily. , Disp: , Rfl:    clobetasol cream (TEMOVATE) 0.05 %, Apply 1 application topically 2 (two) times daily., Disp: , Rfl:    diphenoxylate-atropine (LOMOTIL) 2.5-0.025 MG tablet, Take by mouth., Disp: , Rfl:    famotidine (PEPCID) 20 MG tablet, Take 20 mg by mouth 2 (two) times daily., Disp: , Rfl:    furosemide (LASIX) 20 MG tablet, Take 20 mg by mouth every morning. AM, Disp: , Rfl:    Lancets (ONETOUCH DELICA PLUS LANCET30G) MISC, daily., Disp: , Rfl:    letrozole (FEMARA) 2.5 MG tablet, Take 1 tablet (2.5 mg total) by mouth daily., Disp: 30 tablet, Rfl: 3   levothyroxine (SYNTHROID) 88 MCG tablet, Take by mouth., Disp: , Rfl:     meloxicam (MOBIC) 7.5 MG tablet, Take by mouth., Disp: , Rfl:    metoprolol (LOPRESSOR) 50 MG tablet, Take 50 mg by mouth 2 (two) times daily., Disp: , Rfl:    Multiple Vitamin (  MULTIVITAMIN) tablet, Take 1 tablet by mouth daily., Disp: , Rfl:    Potassium 99 MG TABS, Take 1 tablet by mouth daily., Disp: , Rfl:    potassium chloride (KLOR-CON) 10 MEQ tablet, Take 10 mEq by mouth daily., Disp: , Rfl:    silver sulfADIAZINE (SILVADENE) 1 % cream, Apply 1 Application topically daily., Disp: 50 g, Rfl: 1  Physical exam:  Vitals:   01/22/23 1043 01/22/23 1050  BP: (!) 152/81 (!) 140/86  Pulse: 66 62  Resp: 18   Temp: (!) 96.9 F (36.1 C)   TempSrc: Tympanic   SpO2: 100%   Weight: 207 lb 12.8 oz (94.3 kg)   Height: 5' (1.524 m)    Physical Exam Cardiovascular:     Rate and Rhythm: Normal rate and regular rhythm.     Heart sounds: Normal heart sounds.  Pulmonary:     Effort: Pulmonary effort is normal.     Breath sounds: Normal breath sounds.  Abdominal:     General: Bowel sounds are normal.     Palpations: Abdomen is soft.  Skin:    General: Skin is warm and dry.  Neurological:     Mental Status: She is alert and oriented to person, place, and time.         Latest Ref Rng & Units 01/22/2023   10:33 AM  CMP  Glucose 70 - 99 mg/dL 644   BUN 8 - 23 mg/dL 30   Creatinine 0.34 - 1.00 mg/dL 7.42   Sodium 595 - 638 mmol/L 138   Potassium 3.5 - 5.1 mmol/L 4.6   Chloride 98 - 111 mmol/L 105   CO2 22 - 32 mmol/L 25   Calcium 8.9 - 10.3 mg/dL 9.0   Total Protein 6.5 - 8.1 g/dL 6.9   Total Bilirubin 0.3 - 1.2 mg/dL 0.4   Alkaline Phos 38 - 126 U/L 59   AST 15 - 41 U/L 19   ALT 0 - 44 U/L 13       Latest Ref Rng & Units 12/13/2022   10:04 AM  CBC  WBC 4.0 - 10.5 K/uL 5.2   Hemoglobin 12.0 - 15.0 g/dL 75.6   Hematocrit 43.3 - 46.0 % 41.6   Platelets 150 - 400 K/uL 148      Assessment and plan- Patient is a 82 y.o. female with history of bilateral stage I ER/PR positive  HER2 negative breast cancer.  She is here to discuss final pathology results and further management  Patient is healing well from her radiation dermatitis well.  I think it would be okay for her to get started on letrozole at this time.  Discussed risks and benefits of letrozole including all but not limited to fatigue hot flashes mood swings arthralgias and worsening bone health.  She will continue to take calcium and vitamin D.  Baseline bone density scan is normal.  I will see her back in 4 months no labs    Visit Diagnosis 1. Malignant neoplasm of upper-outer quadrant of right breast in female, estrogen receptor positive (HCC)   2. Malignant neoplasm of upper-outer quadrant of left breast in female, estrogen receptor positive (HCC)      Dr. Owens Shark, MD, MPH [facility] [phone] 01/22/2023 3:38 PM

## 2023-01-22 NOTE — Addendum Note (Signed)
Addended by: Creig Hines on: 01/22/2023 03:46 PM   Modules accepted: Level of Service

## 2023-01-30 ENCOUNTER — Ambulatory Visit
Admission: RE | Admit: 2023-01-30 | Discharge: 2023-01-30 | Disposition: A | Discharge status: Home / Self Care | Payer: Medicare HMO | Source: Ambulatory Visit | Attending: Radiation Oncology | Admitting: Radiation Oncology

## 2023-01-30 ENCOUNTER — Encounter: Payer: Self-pay | Admitting: Radiation Oncology

## 2023-01-30 VITALS — Resp 14 | Ht 61.0 in | Wt 204.8 lb

## 2023-01-30 DIAGNOSIS — C50411 Malignant neoplasm of upper-outer quadrant of right female breast: Secondary | ICD-10-CM | POA: Insufficient documentation

## 2023-01-30 DIAGNOSIS — Z170 Estrogen receptor positive status [ER+]: Secondary | ICD-10-CM | POA: Insufficient documentation

## 2023-01-30 NOTE — Progress Notes (Signed)
Radiation Oncology Follow up Note  Name: Martha Chung   Date:   01/30/2023 MRN:  [id number] DOB: 13-Jul-1940    This 82 y.o. female presents to the clinic today for 1 month follow-up status post whole breast radiation to her right breast for stage IIb (P T2 pN1 M0).  ER/PR positive  REFERRING PROVIDER: Ardyth Man, PA-C  HPI: Patient is a 82 year old female now out 1 month having completed whole breast radiation to her right breast for stage IIb ER/PR positive invasive mammary carcinoma status post wide local excision.  She is seen today in routine follow-up she is doing well.  Specifically denies any breast tenderness cough or bone pain..  She has been started on Femara tolerating it well without side effect except for some minor hot flashes.  COMPLICATIONS OF TREATMENT: none  FOLLOW UP COMPLIANCE: keeps appointments   PHYSICAL EXAM:  Resp 14   Ht 5\' 1"  (1.549 m)   Wt 204 lb 12.8 oz (92.9 kg)   BMI 38.70 kg/m  Lungs are clear to A&P cardiac examination essentially unremarkable with regular rate and rhythm. No dominant mass or nodularity is noted in either breast in 2 positions examined. Incision is well-healed. No axillary or supraclavicular adenopathy is appreciated. Cosmetic result is excellent.  Well-developed well-nourished patient in NAD. HEENT reveals PERLA, EOMI, discs not visualized.  Oral cavity is clear. No oral mucosal lesions are identified. Neck is clear without evidence of cervical or supraclavicular adenopathy. Lungs are clear to A&P. Cardiac examination is essentially unremarkable with regular rate and rhythm without murmur rub or thrill. Abdomen is benign with no organomegaly or masses noted. Motor sensory and DTR levels are equal and symmetric in the upper and lower extremities. Cranial nerves II through XII are grossly intact. Proprioception is intact. No peripheral adenopathy or edema is identified. No motor or sensory levels are noted. Crude visual fields are within  normal range.  RADIOLOGY RESULTS: No current films for review  PLAN: Present time patient is doing well 1 month out from whole breast radiation.  On pleased with her overall progress.  Of asked to see her back in 6 months for follow-up.  She continues on Femara without side effect.  Patient knows to call with any concerns.  I would like to take this opportunity to thank you for allowing me to participate in the care of your patient.Carmina Miller, MD

## 2023-02-15 ENCOUNTER — Other Ambulatory Visit: Payer: Self-pay | Admitting: Oncology

## 2023-05-06 ENCOUNTER — Encounter (INDEPENDENT_AMBULATORY_CARE_PROVIDER_SITE_OTHER): Payer: Medicare HMO | Admitting: Vascular Surgery

## 2023-05-14 ENCOUNTER — Other Ambulatory Visit: Payer: Self-pay | Admitting: Oncology

## 2023-05-24 ENCOUNTER — Inpatient Hospital Stay: Payer: Medicare HMO | Attending: Oncology | Admitting: Oncology

## 2023-05-24 ENCOUNTER — Encounter: Payer: Self-pay | Admitting: Oncology

## 2023-05-24 ENCOUNTER — Ambulatory Visit: Payer: Medicare HMO | Admitting: Oncology

## 2023-05-24 VITALS — BP 148/52 | HR 64 | Temp 98.3°F | Resp 20 | Wt 205.3 lb

## 2023-05-24 DIAGNOSIS — Z833 Family history of diabetes mellitus: Secondary | ICD-10-CM | POA: Diagnosis not present

## 2023-05-24 DIAGNOSIS — Z881 Allergy status to other antibiotic agents status: Secondary | ICD-10-CM | POA: Insufficient documentation

## 2023-05-24 DIAGNOSIS — Z90710 Acquired absence of both cervix and uterus: Secondary | ICD-10-CM | POA: Diagnosis not present

## 2023-05-24 DIAGNOSIS — Z8249 Family history of ischemic heart disease and other diseases of the circulatory system: Secondary | ICD-10-CM | POA: Diagnosis not present

## 2023-05-24 DIAGNOSIS — Z87891 Personal history of nicotine dependence: Secondary | ICD-10-CM | POA: Insufficient documentation

## 2023-05-24 DIAGNOSIS — I509 Heart failure, unspecified: Secondary | ICD-10-CM | POA: Insufficient documentation

## 2023-05-24 DIAGNOSIS — E039 Hypothyroidism, unspecified: Secondary | ICD-10-CM | POA: Insufficient documentation

## 2023-05-24 DIAGNOSIS — Z170 Estrogen receptor positive status [ER+]: Secondary | ICD-10-CM | POA: Diagnosis not present

## 2023-05-24 DIAGNOSIS — I110 Hypertensive heart disease with heart failure: Secondary | ICD-10-CM | POA: Insufficient documentation

## 2023-05-24 DIAGNOSIS — Z1721 Progesterone receptor positive status: Secondary | ICD-10-CM | POA: Diagnosis not present

## 2023-05-24 DIAGNOSIS — Z885 Allergy status to narcotic agent status: Secondary | ICD-10-CM | POA: Diagnosis not present

## 2023-05-24 DIAGNOSIS — Z79811 Long term (current) use of aromatase inhibitors: Secondary | ICD-10-CM | POA: Diagnosis not present

## 2023-05-24 DIAGNOSIS — Z79899 Other long term (current) drug therapy: Secondary | ICD-10-CM | POA: Insufficient documentation

## 2023-05-24 DIAGNOSIS — N6322 Unspecified lump in the left breast, upper inner quadrant: Secondary | ICD-10-CM | POA: Diagnosis not present

## 2023-05-24 DIAGNOSIS — R232 Flushing: Secondary | ICD-10-CM | POA: Insufficient documentation

## 2023-05-24 DIAGNOSIS — Z801 Family history of malignant neoplasm of trachea, bronchus and lung: Secondary | ICD-10-CM | POA: Diagnosis not present

## 2023-05-24 DIAGNOSIS — M109 Gout, unspecified: Secondary | ICD-10-CM | POA: Diagnosis not present

## 2023-05-24 DIAGNOSIS — Z1732 Human epidermal growth factor receptor 2 negative status: Secondary | ICD-10-CM | POA: Diagnosis not present

## 2023-05-24 DIAGNOSIS — Z803 Family history of malignant neoplasm of breast: Secondary | ICD-10-CM | POA: Insufficient documentation

## 2023-05-24 DIAGNOSIS — C50411 Malignant neoplasm of upper-outer quadrant of right female breast: Secondary | ICD-10-CM | POA: Insufficient documentation

## 2023-05-24 DIAGNOSIS — J449 Chronic obstructive pulmonary disease, unspecified: Secondary | ICD-10-CM | POA: Diagnosis not present

## 2023-05-24 DIAGNOSIS — Z9049 Acquired absence of other specified parts of digestive tract: Secondary | ICD-10-CM | POA: Insufficient documentation

## 2023-05-24 DIAGNOSIS — Z800 Family history of malignant neoplasm of digestive organs: Secondary | ICD-10-CM | POA: Insufficient documentation

## 2023-05-24 NOTE — Progress Notes (Signed)
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  She self palpated a right breast mass which led to a bilateral diagnostic mammogram.  This showed a 1.6 cm mass in the 10 o'clock position of the right breast and a 4 mm irregular mass in the outer upper quadrant of the left breast.  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  Left breast biopsy showed grade 1 invasive mammary carcinoma with tubular features ER/PR and HER2 pending.   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  She is independent of her ADLs and IADLs.  No prior history of breast cancer although she has had a left lumpectomy for benign lesion many years ago.  Family history of breast cancer in her mother at the age of 1.   Patient underwent bilateral lumpectomy with sentinel lymph node biopsy.  Right breast pathology from April 2024 showed 3.2 cm grade 2 invasive mammary carcinoma with negative margins.  1 sentinel lymph node positive for malignancy.  Ki-67 15%.  Left breast lumpectomy pathology showed 0.8 cm grade 1 invasive mammary carcinoma with tubular features.  Ki-67 5%.  Both tumors were strongly ER/PR positive HER2 negative.  Oncotype testing around 3.2 cm tumor came back with a recurrence score of 16.  Patient therefore  does not require any adjuvant chemotherapy for her age.  Patient has completed adjuvant radiation therapy.  Baseline bone density scan normal.  Patient started taking letrozole in July 2024  Interval history-tolerating letrozole well so far.  Occasional hot flashes and mood swings which are self-limited.  She is taking her calcium and vitamin D.  Denies any other complaints at this time.  ECOG PS- 1 Pain scale- 0   Review of systems- Review of Systems  Constitutional:  Negative for chills, fever, malaise/fatigue and weight loss.  HENT:  Negative for congestion, ear discharge and nosebleeds.   Eyes:  Negative for blurred vision.  Respiratory:  Negative for cough, hemoptysis, sputum production, shortness of breath and wheezing.   Cardiovascular:  Negative for chest pain, palpitations, orthopnea and claudication.  Gastrointestinal:  Negative for abdominal pain, blood in stool, constipation, diarrhea, heartburn, melena, nausea and vomiting.  Genitourinary:  Negative for dysuria, flank pain, frequency, hematuria and urgency.  Musculoskeletal:  Negative for back pain, joint pain and myalgias.  Skin:  Negative for rash.  Neurological:  Negative for dizziness, tingling, focal weakness, seizures, weakness and headaches.  Endo/Heme/Allergies:  Does not bruise/bleed easily.  Psychiatric/Behavioral:  Negative for depression and suicidal ideas. The patient does not have insomnia.       Allergies  Allergen Reactions   Ivp Dye [Iodinated Contrast Media] Anaphylaxis    Topical betadine is ok.   Morphine And Codeine Anaphylaxis    Respiratory Arrest   Shellfish Allergy Anaphylaxis   Dilaudid [Hydromorphone Hcl]     BP bottomed out  Macrodantin [Nitrofurantoin] Nausea And Vomiting    And diarrhea   Nubain [Nalbuphine Hcl] Other (See Comments)    BP "bottoms out"     Past Medical History:  Diagnosis Date   Arthritis    hands, feet, "all over"   CHF (congestive heart failure) (HCC)     acute, after use of Redux   Complication of anesthesia    on vent after colostomy revision   COPD (chronic obstructive pulmonary disease) (HCC)    chronic bronchitis - no meds   Diabetes mellitus without complication (HCC)    Diverticulitis    Full dentures    upper and lower   GERD (gastroesophageal reflux disease)    Gout    Hypertension    Hypothyroidism    Lichen sclerosus    Vaginal Area   Mitral regurgitation    after use of redux   Right rotator cuff tendonitis    Wears contact lenses      Past Surgical History:  Procedure Laterality Date   ABDOMINAL HYSTERECTOMY  1967   Partial   APPLICATION OF WOUND VAC  02/14/2015   Procedure: APPLICATION OF WOUND VAC;  Surgeon: [name], MD;  Location: [facility];  Service: General;;   BREAST BIOPSY Right 09/12/2022   u/s 10:00 coil path pending   BREAST BIOPSY Left 09/12/2022   2:00 venus [place] bx path pending   breast biopsy Left 09/12/2022   [place] bx mass 2:00 venus marker, path pending   BREAST BIOPSY Left 09/12/2022   [place] LT BREAST BX W LOC DEV 1ST LESION IMG BX SPEC US GUIDE 09/12/2022 [facility]   BREAST BIOPSY Right 09/12/2022   [place] RT BREAST BX W LOC DEV 1ST LESION IMG BX SPEC US GUIDE 09/12/2022 [facility]   BREAST BIOPSY  10/02/2022   MM LT RADIOACTIVE SEED LOC MAMMO GUIDE 10/02/2022 [facility]   BREAST BIOPSY  10/02/2022   MM RT RADIOACTIVE SEED LOC MAMMO GUIDE 10/02/2022 [facility]   BREAST EXCISIONAL BIOPSY Left 1974   benign   BREAST LUMPECTOMY WITH RADIOACTIVE SEED AND SENTINEL LYMPH NODE BIOPSY Right 10/02/2022   Procedure: RIGHT BREAST LUMPECTOMY WITH RADIOACTIVE SEED AND RIGHT AXILLARY SENTINEL LYMPH NODE BIOPSY;  Surgeon: Emelia Loron, MD;  Location: [facility];  Service: General;  Laterality: Right;   BREAST LUMPECTOMY WITH RADIOACTIVE SEED LOCALIZATION Left 10/02/2022   Procedure: LEFT BREAST LUMPECTOMY WITH RADIOACTIVE SEED LOCALIZATION;  Surgeon: Emelia Loron, MD;   Location: [facility];  Service: General;  Laterality: Left;   CHOLECYSTECTOMY     COLECTOMY WITH COLOSTOMY CREATION/HARTMANN PROCEDURE  07/03/2014   COLONOSCOPY N/A 11/25/2014   Procedure: COLONOSCOPY;  Surgeon: Midge Minium, MD;  Location: [facility];  Service: Gastroenterology;  Laterality: N/A;   COLOSTOMY TAKEDOWN N/A 01/31/2015   Procedure: COLOSTOMY TAKEDOWN, partial colectomy;  Surgeon: [name], MD;  Location: [facility];  Service: General;  Laterality: N/A;   CORRECTION OVERLAPPING TOES Bilateral 2012   bilateral hammertoes #2 & #3 both feet   HERNIA REPAIR  07/02/2013   JOINT REPLACEMENT Right 2006   knee   JOINT REPLACEMENT Left 2008   knee   KIDNEY SURGERY Right    LAPAROTOMY     construct new colostomy and place wound VAC   LAPAROTOMY N/A 02/14/2015   Procedure: RE-OPEN EXPLORATORY LAPAROTOMY, COMPLEX LYSIS OF ADHESIONS, CONSTRUCTION OF LOOP ILEOSTOMY,;  Surgeon: [name], MD;  Location: [facility];  Service: General;  Laterality: N/A;   POLYPECTOMY  11/25/2014  Procedure: POLYPECTOMY INTESTINAL;  Surgeon: Midge Minium, MD;  Location: [facility];  Service: Gastroenterology;;   TONSILLECTOMY      Social History   Socioeconomic History   Marital status: Widowed    Spouse name: Not on file   Number of children: Not on file   Years of education: Not on file   Highest education level: Not on file  Occupational History   Not on file  Tobacco Use   Smoking status: Former    Current packs/day: 0.00    Types: Cigarettes    Quit date: 07/03/1999    Years since quitting: 23.9   Smokeless tobacco: Never  Substance and Sexual Activity   Alcohol use: No   Drug use: No   Sexual activity: Never    Birth control/protection: None  Other Topics Concern   Not on file  Social History Narrative   Not on file   Social Determinants of Health   Financial Resource Strain: Medium Risk (02/25/2023)   [facility]    Overall Financial Resource Strain (CARDIA)    Difficulty of Paying Living Expenses: Somewhat hard  Food Insecurity: Food Insecurity Present (02/25/2023)   Received from [facility]   Hunger Vital Sign    Worried About Running Out of Food in the Last Year: Sometimes true    Ran Out of Food in the Last Year: Never true  Transportation Needs: No Transportation Needs (02/25/2023)   Received from [facility] - Transportation    In the past 12 months, has lack of transportation kept you from medical appointments or from getting medications?: No    Lack of Transportation (Non-Medical): No  Physical Activity: Not on file  Stress: Not on file  Social Connections: Not on file  Intimate Partner Violence: Not on file    Family History  Problem Relation Age of Onset   Hypertension Mother    COPD Mother    Heart disease Mother    Breast cancer Mother 18   Heart disease Father    COPD Father    Brain cancer Sister 7       tumor   Lung cancer Brother 35   Colon cancer Paternal Aunt        d. 82s   Juvenile Diabetes Son      Current Outpatient Medications:    allopurinol (ZYLOPRIM) 100 MG tablet, Take 100 mg by mouth daily. AM, Disp: , Rfl:    Calcium Carb-Cholecalciferol (CALCIUM 600 + D PO), Take 600 mg elemental calcium/kg/hr by mouth daily. , Disp: , Rfl:    clobetasol cream (TEMOVATE) 0.05 %, Apply 1 application topically 2 (two) times daily., Disp: , Rfl:    diphenoxylate-atropine (LOMOTIL) 2.5-0.025 MG tablet, Take by mouth., Disp: , Rfl:    famotidine (PEPCID) 20 MG tablet, Take 20 mg by mouth 2 (two) times daily., Disp: , Rfl:    furosemide (LASIX) 20 MG tablet, Take 20 mg by mouth every morning. AM, Disp: , Rfl:    Lancets (ONETOUCH DELICA PLUS LANCET30G) MISC, daily., Disp: , Rfl:    letrozole (FEMARA) 2.5 MG tablet, Take 1 tablet by mouth once daily, Disp: 90 tablet, Rfl: 0   levothyroxine (SYNTHROID) 88 MCG tablet, Take by mouth.,  Disp: , Rfl:    losartan (COZAAR) 25 MG tablet, Take 1 tablet by mouth daily., Disp: , Rfl:    meloxicam (MOBIC) 7.5 MG tablet, Take by mouth., Disp: , Rfl:  metoprolol (LOPRESSOR) 50 MG tablet, Take 50 mg by mouth 2 (two) times daily., Disp: , Rfl:    MIEBO 1.338 GM/ML SOLN, Apply 1 drop to eye 2 (two) times daily., Disp: , Rfl:    moxifloxacin (VIGAMOX) 0.5 % ophthalmic solution, Apply to eye., Disp: , Rfl:    Multiple Vitamin (MULTIVITAMIN) tablet, Take 1 tablet by mouth daily., Disp: , Rfl:    Potassium 99 MG TABS, Take 1 tablet by mouth daily., Disp: , Rfl:    potassium chloride (KLOR-CON) 10 MEQ tablet, Take 10 mEq by mouth daily., Disp: , Rfl:    silver sulfADIAZINE (SILVADENE) 1 % cream, Apply 1 Application topically daily., Disp: 50 g, Rfl: 1  Physical exam:  Vitals:   05/24/23 0958  BP: (!) 148/52  Pulse: 64  Resp: 20  Temp: 98.3 F (36.8 C)  SpO2: 100%  Weight: 205 lb 4.8 oz (93.1 kg)   Physical Exam Cardiovascular:     Rate and Rhythm: Normal rate and regular rhythm.     Heart sounds: Normal heart sounds.  Pulmonary:     Effort: Pulmonary effort is normal.     Breath sounds: Normal breath sounds.  Skin:    General: Skin is warm and dry.  Neurological:     Mental Status: She is alert and oriented to person, place, and time.   Breast exam: Patient is s/p bilateral lumpectomy with a well-healed surgical scar.  No palpable bilateral breast masses.  No palpable bilateral axillary adenopathy.     Latest Ref Rng & Units 01/22/2023   10:33 AM  CMP  Glucose 70 - 99 mg/dL 119   BUN 8 - 23 mg/dL 30   Creatinine 1.47 - 1.00 mg/dL 8.29   Sodium 562 - 130 mmol/L 138   Potassium 3.5 - 5.1 mmol/L 4.6   Chloride 98 - 111 mmol/L 105   CO2 22 - 32 mmol/L 25   Calcium 8.9 - 10.3 mg/dL 9.0   Total Protein 6.5 - 8.1 g/dL 6.9   Total Bilirubin 0.3 - 1.2 mg/dL 0.4   Alkaline Phos 38 - 126 U/L 59   AST 15 - 41 U/L 19   ALT 0 - 44 U/L 13       Latest Ref Rng & Units  12/13/2022   10:04 AM  CBC  WBC 4.0 - 10.5 K/uL 5.2   Hemoglobin 12.0 - 15.0 g/dL 86.5   Hematocrit 78.4 - 46.0 % 41.6   Platelets 150 - 400 K/uL 148      Assessment and plan- Patient is a 82 y.o. female  with history of bilateral stage I ER/PR positive HER2 negative breast cancer.  She is currently on letrozole and this is a routine follow-up visit  Patient is tolerating letrozole well without  significant side effects.  Given bilateral stage I breast cancer she will continue taking this for 5 years if tolerated.  Continue calcium and vitamin D.  Baseline bone density scan is normal.  I will schedule her mammogram for March 2025 and see her back in 6 months no labs   Visit Diagnosis 1. Malignant neoplasm of upper-outer quadrant of right breast in female, estrogen receptor positive (HCC)   2. Use of letrozole (Femara)      Dr. Owens Shark, MD, MPH [facility] [phone] 05/24/2023 1:19 PM

## 2023-05-27 ENCOUNTER — Inpatient Hospital Stay: Payer: Medicare HMO

## 2023-05-27 NOTE — Progress Notes (Signed)
[facility] Clinical Social Work  Initial Assessment   Martha Chung is a 82 y.o. year old female contacted caregiver by phone. Clinical Social Work was referred by medical provider for assessment of psychosocial needs.   SDOH (Social Determinants of Health) assessments performed: Yes   SDOH Screenings   Food Insecurity: Food Insecurity Present (02/25/2023)   Received from [facility]  Transportation Needs: No Transportation Needs (02/25/2023)   Received from [facility]  Utilities: Not At Risk (02/25/2023)   Received from [facility]  Financial Resource Strain: Medium Risk (02/25/2023)   Received from [facility]  Tobacco Use: Medium Risk (05/24/2023)     Distress Screen completed: No    09/17/2022   11:08 AM  ONCBCN DISTRESS SCREENING  Screening Type Initial Screening  Distress experienced in past week (1-10) 0      Family/Social Information:  Housing Arrangement: patient lives with her daughter, Martha Chung members/support persons in your life? Family Transportation concerns: no  Employment: Retired Engineer, civil (consulting).  Income source: Actor concerns: Yes, due to illness and/or loss of work during treatment Type of concern: Medical bills Food access concerns: yes Religious or spiritual practice: Yes-Patient identifies as [employer] Services Currently in place:  Medicare  Coping/ Adjustment to diagnosis: Patient understands treatment plan and what happens next? yes, Patient has completed treatment for breast cancer. Concerns about diagnosis and/or treatment: How I will pay for the services I need Patient reported stressors: Finances Hopes and/or priorities: Pay bills. Current coping skills/ strengths: General fund of knowledge , Supportive family/friends , and Work skills     SUMMARY: Current SDOH Barriers:  Financial constraints related to fixed income.  Clinical Social  Work Clinical Goal(s):  Scientist, research (life sciences) options for unmet needs related to:  Financial Strain   Interventions: Discussed common feeling and emotions when being diagnosed with cancer, and the importance of support during treatment Informed patient of the support team roles and support services at [facility] Provided CSW contact information and encouraged patient to call with any questions or concerns Provided patient with information about breast cancer grants, Academic librarian, the [employer], Publishing copy.org the [employer].  It was securely emailed to Martha Chung.   Follow Up Plan: Patient will contact CSW with any support or resource needs Patient verbalizes understanding of plan: Yes    Dorothey Baseman, LCSW Clinical Social Worker [facility]

## 2023-07-22 ENCOUNTER — Encounter (INDEPENDENT_AMBULATORY_CARE_PROVIDER_SITE_OTHER): Payer: Self-pay | Admitting: Vascular Surgery

## 2023-08-08 ENCOUNTER — Ambulatory Visit
Admission: RE | Admit: 2023-08-08 | Discharge: 2023-08-08 | Disposition: A | Discharge status: Home / Self Care | Payer: Medicare HMO | Source: Ambulatory Visit | Attending: Radiation Oncology | Admitting: Radiation Oncology

## 2023-08-08 ENCOUNTER — Encounter: Payer: Self-pay | Admitting: Radiation Oncology

## 2023-08-08 VITALS — BP 172/85 | HR 64 | Temp 97.7°F | Resp 16 | Wt 206.0 lb

## 2023-08-08 DIAGNOSIS — C50911 Malignant neoplasm of unspecified site of right female breast: Secondary | ICD-10-CM | POA: Insufficient documentation

## 2023-08-08 DIAGNOSIS — Z170 Estrogen receptor positive status [ER+]: Secondary | ICD-10-CM

## 2023-08-08 DIAGNOSIS — Z923 Personal history of irradiation: Secondary | ICD-10-CM | POA: Diagnosis not present

## 2023-08-08 DIAGNOSIS — R232 Flushing: Secondary | ICD-10-CM | POA: Diagnosis not present

## 2023-08-08 DIAGNOSIS — Z79811 Long term (current) use of aromatase inhibitors: Secondary | ICD-10-CM | POA: Diagnosis not present

## 2023-08-08 NOTE — Progress Notes (Signed)
 Radiation Oncology Follow up Note  Name: Martha Chung   Date:   08/08/2023 MRN:  [id number] DOB: 15-Nov-1940    This 83 y.o. female presents to the clinic today for 45-month follow-up status post whole breast radiation to her right breast for stage IIb (pT2 pN1 M0) ER/PR positive invasive mammary carcinoma.  REFERRING PROVIDER: Johnie Perkins, PA-C  HPI: Patient is an 83 year old female now out 7 months having completed whole breast radiation to her right breast for stage IIb ER/PR positive invasive mammary carcinoma seen today in routine follow-up she is doing well.  She specifically denies breast tenderness cough or bone pain.  She has a mammogram scheduled for April..  She is current on Femara  tolerating it well without side effect  COMPLICATIONS OF TREATMENT: none  FOLLOW UP COMPLIANCE: keeps appointments   PHYSICAL EXAM:  BP (!) 172/85 Comment: patient in the process of adjusting BP meds  Pulse 64   Temp 97.7 F (36.5 C) (Temporal)   Resp 16   Wt 206 lb (93.4 kg)   BMI 38.92 kg/m  Lungs are clear to A&P cardiac examination essentially unremarkable with regular rate and rhythm. No dominant mass or nodularity is noted in either breast in 2 positions examined. Incision is well-healed. No axillary or supraclavicular adenopathy is appreciated. Cosmetic result is excellent.  Well-developed well-nourished patient in NAD. HEENT reveals PERLA, EOMI, discs not visualized.  Oral cavity is clear. No oral mucosal lesions are identified. Neck is clear without evidence of cervical or supraclavicular adenopathy. Lungs are clear to A&P. Cardiac examination is essentially unremarkable with regular rate and rhythm without murmur rub or thrill. Abdomen is benign with no organomegaly or masses noted. Motor sensory and DTR levels are equal and symmetric in the upper and lower extremities. Cranial nerves II through XII are grossly intact. Proprioception is intact. No peripheral adenopathy or edema is  identified. No motor or sensory levels are noted. Crude visual fields are within normal range.  RADIOLOGY RESULTS: No current films for review  PLAN: At the present time patient is doing well 7 months out with no evidence of disease.  I will review her mammograms when the become available.  I have asked to see her back in 6 months for follow-up and then we will start once year follow-up visits.  She continues on Femara  without side effect.  Patient knows to call with any concerns.  She is having some slight hot flashes have suggested vitamin EE supplements.  I would like to take this opportunity to thank you for allowing me to participate in the care of your patient.SABRA Marcey Penton, MD

## 2023-08-08 NOTE — Progress Notes (Signed)
 Survivorship Care Plan visit completed.  Treatment summary reviewed and given to patient.  ASCO answers booklet reviewed and given to patient.  CARE program and Cancer Transitions discussed with patient along with other resources cancer center offers to patients and caregivers.  Patient verbalized understanding.

## 2023-08-10 ENCOUNTER — Other Ambulatory Visit: Payer: Self-pay | Admitting: Oncology

## 2023-08-23 ENCOUNTER — Other Ambulatory Visit: Payer: Self-pay | Admitting: Family Medicine

## 2023-08-23 DIAGNOSIS — R519 Headache, unspecified: Secondary | ICD-10-CM

## 2023-09-04 DIAGNOSIS — I739 Peripheral vascular disease, unspecified: Secondary | ICD-10-CM | POA: Insufficient documentation

## 2023-09-04 DIAGNOSIS — M79606 Pain in leg, unspecified: Secondary | ICD-10-CM | POA: Insufficient documentation

## 2023-09-04 NOTE — Progress Notes (Signed)
 MRN : [id number]  Martha Chung is a 83 y.o. (Nov 18, 1940) female who presents with chief complaint of check circulation.  History of Present Illness:   The patient is seen for evaluation of painful lower extremities. Patient notes the pain is variable and not always associated with activity.  The pain is somewhat consistent day to day occurring on most days. The patient notes the pain also occurs with standing or sitting for long periods.  The extremity pain routinely seems worse as the day wears on. The pain has been progressive over the past several years. The patient states these symptoms are causing a negative impact on quality of life and daily activities which was a factor in the evaluation.  She also notes that her pain has been associated with some swelling bilaterally as well as some discoloration of the medial ankle areas  The patient has a history of back problems and DJD of the lumbar and sacral spine.   The patient denies rest pain or dangling of an extremity off the side of the bed during the night for relief. No open wounds or sores at this time. No history of DVT or phlebitis. No prior vascular interventions or surgeries.   No outpatient medications have been marked as taking for the 09/09/23 encounter (Appointment) with Gilda Crease, Latina Craver, MD.    Past Medical History:  Diagnosis Date   Arthritis    hands, feet, "all over"   CHF (congestive heart failure) (HCC)    acute, after use of Redux   Complication of anesthesia    on vent after colostomy revision   COPD (chronic obstructive pulmonary disease) (HCC)    chronic bronchitis - no meds   Diabetes mellitus without complication (HCC)    Diverticulitis    Full dentures    upper and lower   GERD (gastroesophageal reflux disease)    Gout    Hypertension    Hypothyroidism    Lichen sclerosus    Vaginal Area   Mitral regurgitation    after  use of redux   Right rotator cuff tendonitis    Wears contact lenses     Past Surgical History:  Procedure Laterality Date   ABDOMINAL HYSTERECTOMY  1967   Partial   APPLICATION OF WOUND VAC  02/14/2015   Procedure: APPLICATION OF WOUND VAC;  Surgeon: [name], MD;  Location: [facility];  Service: General;;   BREAST BIOPSY Right 09/12/2022   u/s 10:00 coil path pending   BREAST BIOPSY Left 09/12/2022   2:00 venus [place] bx path pending   breast biopsy Left 09/12/2022   [place] bx mass 2:00 venus marker, path pending   BREAST BIOPSY Left 09/12/2022   [place] LT BREAST BX W LOC DEV 1ST LESION IMG BX SPEC US GUIDE 09/12/2022 [facility]   BREAST BIOPSY Right 09/12/2022   [place] RT BREAST BX W LOC DEV 1ST LESION IMG BX SPEC US GUIDE 09/12/2022 [facility]   BREAST BIOPSY  10/02/2022   MM LT RADIOACTIVE SEED LOC MAMMO GUIDE 10/02/2022 [facility]   BREAST BIOPSY  10/02/2022   MM RT RADIOACTIVE  SEED LOC MAMMO GUIDE 10/02/2022 [facility]   BREAST EXCISIONAL BIOPSY Left 1974   benign   BREAST LUMPECTOMY WITH RADIOACTIVE SEED AND SENTINEL LYMPH NODE BIOPSY Right 10/02/2022   Procedure: RIGHT BREAST LUMPECTOMY WITH RADIOACTIVE SEED AND RIGHT AXILLARY SENTINEL LYMPH NODE BIOPSY;  Surgeon: Emelia Loron, MD;  Location: [facility];  Service: General;  Laterality: Right;   BREAST LUMPECTOMY WITH RADIOACTIVE SEED LOCALIZATION Left 10/02/2022   Procedure: LEFT BREAST LUMPECTOMY WITH RADIOACTIVE SEED LOCALIZATION;  Surgeon: Emelia Loron, MD;  Location: [facility];  Service: General;  Laterality: Left;   CHOLECYSTECTOMY     COLECTOMY WITH COLOSTOMY CREATION/HARTMANN PROCEDURE  07/03/2014   COLONOSCOPY N/A 11/25/2014   Procedure: COLONOSCOPY;  Surgeon: Midge Minium, MD;  Location: [facility];  Service: Gastroenterology;  Laterality: N/A;   COLOSTOMY TAKEDOWN N/A 01/31/2015   Procedure: COLOSTOMY TAKEDOWN, partial colectomy;  Surgeon: [name], MD;  Location:  [facility];  Service: General;  Laterality: N/A;   CORRECTION OVERLAPPING TOES Bilateral 2012   bilateral hammertoes #2 & #3 both feet   HERNIA REPAIR  07/02/2013   JOINT REPLACEMENT Right 2006   knee   JOINT REPLACEMENT Left 2008   knee   KIDNEY SURGERY Right    LAPAROTOMY     construct new colostomy and place wound VAC   LAPAROTOMY N/A 02/14/2015   Procedure: RE-OPEN EXPLORATORY LAPAROTOMY, COMPLEX LYSIS OF ADHESIONS, CONSTRUCTION OF LOOP ILEOSTOMY,;  Surgeon: [name], MD;  Location: [facility];  Service: General;  Laterality: N/A;   POLYPECTOMY  11/25/2014   Procedure: POLYPECTOMY INTESTINAL;  Surgeon: Midge Minium, MD;  Location: [facility];  Service: Gastroenterology;;   TONSILLECTOMY      Social History Social History   Tobacco Use   Smoking status: Former    Current packs/day: 0.00    Types: Cigarettes    Quit date: 07/03/1999    Years since quitting: 24.1   Smokeless tobacco: Never  Substance Use Topics   Alcohol use: No   Drug use: No    Family History Family History  Problem Relation Age of Onset   Hypertension Mother    COPD Mother    Heart disease Mother    Breast cancer Mother 58   Heart disease Father    COPD Father    Brain cancer Sister 7       tumor   Lung cancer Brother 27   Colon cancer Paternal Aunt        d. 39s   Juvenile Diabetes Son     Allergies  Allergen Reactions   Ivp Dye [Iodinated Contrast Media] Anaphylaxis    Topical betadine is ok.   Morphine And Codeine Anaphylaxis    Respiratory Arrest   Shellfish Allergy Anaphylaxis   Dilaudid [Hydromorphone Hcl]     BP bottomed out   Macrodantin [Nitrofurantoin] Nausea And Vomiting    And diarrhea   Nubain [Nalbuphine Hcl] Other (See Comments)    BP "bottoms out"     REVIEW OF SYSTEMS (Negative unless checked)  Constitutional: [] Weight loss  [] Fever  [] Chills Cardiac: [] Chest pain   [] Chest pressure   [] Palpitations   [] Shortness of breath when laying flat   [] Shortness of  breath with exertion. Vascular:  [x] Pain in legs with walking   [] Pain in legs at rest  [] History of DVT   [] Phlebitis   [] Swelling in legs   [] Varicose veins   [] Non-healing ulcers Pulmonary:   [] Uses home oxygen   [] Productive cough   []   Hemoptysis   [] Wheeze  [x] COPD   [] Asthma Neurologic:  [] Dizziness   [] Seizures   [] History of stroke   [] History of TIA  [] Aphasia   [] Vissual changes   [] Weakness or numbness in arm   [x] Weakness or numbness in leg Musculoskeletal:   [] Joint swelling   [x] Joint pain   [x] Low back pain Hematologic:  [] Easy bruising  [] Easy bleeding   [] Hypercoagulable state   [] Anemic Gastrointestinal:  [] Diarrhea   [] Vomiting  [x] Gastroesophageal reflux/heartburn   [] Difficulty swallowing. Genitourinary:  [] Chronic kidney disease   [] Difficult urination  [] Frequent urination   [] Blood in urine Skin:  [] Rashes   [] Ulcers  Psychological:  [] History of anxiety   []  History of major depression.  Physical Examination  There were no vitals filed for this visit. There is no height or weight on file to calculate BMI. Gen: WD/WN, NAD Head: [place]/AT, No temporalis wasting.  Ear/Nose/Throat: Hearing grossly intact, nares w/o erythema or drainage Eyes: PER, EOMI, sclera nonicteric.  Neck: Supple, no masses.  No bruit or JVD.  Pulmonary:  Good air movement, no audible wheezing, no use of accessory muscles.  Cardiac: RRR, normal S1, S2, no Murmurs. Vascular:  scattered varicosities present bilaterally.  Moderate venous stasis changes to the legs bilaterally.  2+ soft pitting edema. CEAP [id number] , no open wounds Vessel Right Left  Radial Palpable Palpable  PT Not Palpable Not Palpable  DP Not Palpable Not Palpable  Gastrointestinal: soft, non-distended. No guarding/no peritoneal signs.  Musculoskeletal: M/S 5/5 throughout.  No visible deformity.  Neurologic: CN 2-12 intact. Pain and light touch intact in extremities.  Symmetrical.  Speech is fluent. Motor exam as listed  above. Psychiatric: Judgment intact, Mood & affect appropriate for pt's clinical situation. Dermatologic: venous rashes no ulcers noted.  No changes consistent with cellulitis.   CBC Lab Results  Component Value Date   WBC 5.2 12/13/2022   HGB 13.3 12/13/2022   HCT 41.6 12/13/2022   MCV 97.4 12/13/2022   PLT 148 (L) 12/13/2022    BMET    Component Value Date/Time   NA 138 01/22/2023 1033   NA 136 07/19/2014 0243   K 4.6 01/22/2023 1033   K 4.5 07/19/2014 0243   CL 105 01/22/2023 1033   CL 104 07/19/2014 0243   CO2 25 01/22/2023 1033   CO2 27 07/19/2014 0243   GLUCOSE 110 (H) 01/22/2023 1033   GLUCOSE 126 (H) 07/19/2014 0243   BUN 30 (H) 01/22/2023 1033   BUN 22 (H) 07/19/2014 0243   CREATININE 1.05 (H) 01/22/2023 1033   CREATININE 0.74 07/19/2014 0243   CALCIUM 9.0 01/22/2023 1033   CALCIUM 8.5 07/19/2014 0243   GFRNONAA 53 (L) 01/22/2023 1033   GFRNONAA >60 07/19/2014 0243   GFRAA >60 09/14/2019 0527   GFRAA >60 07/19/2014 0243   CrCl cannot be calculated (Patient's most recent lab result is older than the maximum 21 days allowed.).  COAG Lab Results  Component Value Date   INR 1.0 09/11/2019   INR 1.19 02/13/2015   INR 1.3 07/06/2014    Radiology No results found.   Assessment/Plan 1. Pain in both lower extremities (Primary)  Recommend:  The patient has atypical pain symptoms for pure atherosclerotic disease. However, on physical exam there is evidence of vascular disease, given the diminished pulses and the edema of the legs.  Further investigation of the patient's vascular disease is necessary to determine the relationship of the patient's lower extremity symptoms and the degree of vascular disease.  Noninvasive  studies of the will be obtained and the patient will follow up with me to review these studies.  I suspect the patient is c/o pseudoclaudication.  Patient should have an evaluation of his LS spine which I defer to either the primary service,  pain management or the Spine service.  The patient should continue walking and begin a more formal exercise program. The patient should continue his antiplatelet therapy and aggressive treatment of the lipid abnormalities. - VAS [place] ABI WITH/WO TBI; Future - VAS [place] LOWER EXTREMITY VENOUS REFLUX; Future  2. PAD (peripheral artery disease) (HCC)  Recommend:  The patient has atypical pain symptoms for pure atherosclerotic disease. However, on physical exam there is evidence of vascular disease, given the diminished pulses and the edema of the legs.  Further investigation of the patient's vascular disease is necessary to determine the relationship of the patient's lower extremity symptoms and the degree of vascular disease.  Noninvasive studies of the will be obtained and the patient will follow up with me to review these studies.  I suspect the patient is c/o pseudoclaudication.  Patient should have an evaluation of his LS spine which I defer to either the primary service, pain management or the Spine service.  The patient should continue walking and begin a more formal exercise program. The patient should continue his antiplatelet therapy and aggressive treatment of the lipid abnormalities. - VAS [place] ABI WITH/WO TBI; Future  3. Essential (primary) hypertension Continue antihypertensive medications as already ordered, these medications have been reviewed and there are no changes at this time.  4. Pulmonary emphysema, unspecified emphysema type (HCC) Continue pulmonary medications and aerosols as already ordered, these medications have been reviewed and there are no changes at this time.   5. Gastroesophageal reflux disease without esophagitis Continue PPI as already ordered, this medication has been reviewed and there are no changes at this time.  Avoidence of caffeine and alcohol  Moderate elevation of the head of the bed     Levora Dredge, MD  09/04/2023 11:09 AM

## 2023-09-06 ENCOUNTER — Ambulatory Visit
Admission: RE | Admit: 2023-09-06 | Discharge: 2023-09-06 | Disposition: A | Discharge status: Home / Self Care | Payer: Medicare HMO | Source: Ambulatory Visit | Attending: Family Medicine | Admitting: Family Medicine

## 2023-09-06 DIAGNOSIS — R519 Headache, unspecified: Secondary | ICD-10-CM | POA: Diagnosis present

## 2023-09-09 ENCOUNTER — Encounter (INDEPENDENT_AMBULATORY_CARE_PROVIDER_SITE_OTHER): Payer: Self-pay | Admitting: Vascular Surgery

## 2023-09-09 ENCOUNTER — Ambulatory Visit (INDEPENDENT_AMBULATORY_CARE_PROVIDER_SITE_OTHER): Payer: Medicare HMO | Admitting: Vascular Surgery

## 2023-09-09 VITALS — BP 150/70 | HR 65 | Resp 16 | Wt 205.0 lb

## 2023-09-09 DIAGNOSIS — I10 Essential (primary) hypertension: Secondary | ICD-10-CM

## 2023-09-09 DIAGNOSIS — J439 Emphysema, unspecified: Secondary | ICD-10-CM | POA: Diagnosis not present

## 2023-09-09 DIAGNOSIS — I739 Peripheral vascular disease, unspecified: Secondary | ICD-10-CM

## 2023-09-09 DIAGNOSIS — M79605 Pain in left leg: Secondary | ICD-10-CM

## 2023-09-09 DIAGNOSIS — K219 Gastro-esophageal reflux disease without esophagitis: Secondary | ICD-10-CM

## 2023-09-09 DIAGNOSIS — M79604 Pain in right leg: Secondary | ICD-10-CM

## 2023-09-12 DIAGNOSIS — I2510 Atherosclerotic heart disease of native coronary artery without angina pectoris: Secondary | ICD-10-CM | POA: Insufficient documentation

## 2023-09-20 ENCOUNTER — Ambulatory Visit
Admission: RE | Admit: 2023-09-20 | Discharge: 2023-09-20 | Disposition: A | Discharge status: Home / Self Care | Payer: Medicare HMO | Source: Ambulatory Visit | Attending: Oncology | Admitting: Oncology

## 2023-09-20 DIAGNOSIS — C50411 Malignant neoplasm of upper-outer quadrant of right female breast: Secondary | ICD-10-CM

## 2023-09-20 DIAGNOSIS — Z170 Estrogen receptor positive status [ER+]: Secondary | ICD-10-CM | POA: Diagnosis present

## 2023-09-20 DIAGNOSIS — C50412 Malignant neoplasm of upper-outer quadrant of left female breast: Secondary | ICD-10-CM | POA: Insufficient documentation

## 2023-09-26 DIAGNOSIS — Z853 Personal history of malignant neoplasm of breast: Secondary | ICD-10-CM | POA: Insufficient documentation

## 2023-09-30 ENCOUNTER — Encounter: Payer: Self-pay | Admitting: Oncology

## 2023-09-30 ENCOUNTER — Inpatient Hospital Stay: Attending: Oncology | Admitting: Oncology

## 2023-09-30 VITALS — BP 138/42 | HR 61 | Temp 97.0°F | Resp 19 | Ht 61.0 in | Wt 208.4 lb

## 2023-09-30 DIAGNOSIS — Z1721 Progesterone receptor positive status: Secondary | ICD-10-CM | POA: Diagnosis not present

## 2023-09-30 DIAGNOSIS — C50411 Malignant neoplasm of upper-outer quadrant of right female breast: Secondary | ICD-10-CM | POA: Diagnosis present

## 2023-09-30 DIAGNOSIS — Z1732 Human epidermal growth factor receptor 2 negative status: Secondary | ICD-10-CM | POA: Insufficient documentation

## 2023-09-30 DIAGNOSIS — Z170 Estrogen receptor positive status [ER+]: Secondary | ICD-10-CM | POA: Insufficient documentation

## 2023-09-30 DIAGNOSIS — Z08 Encounter for follow-up examination after completed treatment for malignant neoplasm: Secondary | ICD-10-CM

## 2023-09-30 DIAGNOSIS — Z800 Family history of malignant neoplasm of digestive organs: Secondary | ICD-10-CM | POA: Diagnosis not present

## 2023-09-30 DIAGNOSIS — I10 Essential (primary) hypertension: Secondary | ICD-10-CM | POA: Diagnosis not present

## 2023-09-30 DIAGNOSIS — Z90710 Acquired absence of both cervix and uterus: Secondary | ICD-10-CM | POA: Insufficient documentation

## 2023-09-30 DIAGNOSIS — Z881 Allergy status to other antibiotic agents status: Secondary | ICD-10-CM | POA: Diagnosis not present

## 2023-09-30 DIAGNOSIS — Z833 Family history of diabetes mellitus: Secondary | ICD-10-CM | POA: Insufficient documentation

## 2023-09-30 DIAGNOSIS — Z91041 Radiographic dye allergy status: Secondary | ICD-10-CM | POA: Insufficient documentation

## 2023-09-30 DIAGNOSIS — Z9889 Other specified postprocedural states: Secondary | ICD-10-CM | POA: Diagnosis not present

## 2023-09-30 DIAGNOSIS — Z9049 Acquired absence of other specified parts of digestive tract: Secondary | ICD-10-CM | POA: Diagnosis not present

## 2023-09-30 DIAGNOSIS — Z79899 Other long term (current) drug therapy: Secondary | ICD-10-CM | POA: Insufficient documentation

## 2023-09-30 DIAGNOSIS — Z853 Personal history of malignant neoplasm of breast: Secondary | ICD-10-CM | POA: Diagnosis not present

## 2023-09-30 DIAGNOSIS — Z803 Family history of malignant neoplasm of breast: Secondary | ICD-10-CM | POA: Diagnosis not present

## 2023-09-30 DIAGNOSIS — Z801 Family history of malignant neoplasm of trachea, bronchus and lung: Secondary | ICD-10-CM | POA: Insufficient documentation

## 2023-09-30 DIAGNOSIS — I2510 Atherosclerotic heart disease of native coronary artery without angina pectoris: Secondary | ICD-10-CM | POA: Diagnosis not present

## 2023-09-30 DIAGNOSIS — J449 Chronic obstructive pulmonary disease, unspecified: Secondary | ICD-10-CM | POA: Insufficient documentation

## 2023-09-30 DIAGNOSIS — Z79811 Long term (current) use of aromatase inhibitors: Secondary | ICD-10-CM | POA: Insufficient documentation

## 2023-09-30 DIAGNOSIS — Z8249 Family history of ischemic heart disease and other diseases of the circulatory system: Secondary | ICD-10-CM | POA: Diagnosis not present

## 2023-09-30 DIAGNOSIS — Z5986 Financial insecurity: Secondary | ICD-10-CM | POA: Diagnosis not present

## 2023-09-30 DIAGNOSIS — E119 Type 2 diabetes mellitus without complications: Secondary | ICD-10-CM | POA: Insufficient documentation

## 2023-09-30 DIAGNOSIS — Z87891 Personal history of nicotine dependence: Secondary | ICD-10-CM | POA: Diagnosis not present

## 2023-09-30 DIAGNOSIS — Z885 Allergy status to narcotic agent status: Secondary | ICD-10-CM | POA: Diagnosis not present

## 2023-09-30 DIAGNOSIS — Z825 Family history of asthma and other chronic lower respiratory diseases: Secondary | ICD-10-CM | POA: Insufficient documentation

## 2023-09-30 DIAGNOSIS — Z923 Personal history of irradiation: Secondary | ICD-10-CM | POA: Diagnosis not present

## 2023-09-30 NOTE — Progress Notes (Signed)
 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  She self palpated a right breast mass which led to a bilateral diagnostic mammogram.  This showed a 1.6 cm mass in the 10 o'clock position of the right breast and a 4 mm irregular mass in the outer upper quadrant of the left breast.  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  Left breast biopsy showed grade 1 invasive mammary carcinoma with tubular features ER/PR and HER2 pending.   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  She is independent of her ADLs and IADLs.  No prior history of breast cancer although she has had a left lumpectomy for benign lesion many years ago.  Family history of breast cancer in her mother at the age of 67.   Patient underwent bilateral lumpectomy with sentinel lymph node biopsy.  Right breast pathology from April 2024 showed 3.2 cm grade 2 invasive mammary carcinoma with negative margins.  1 sentinel lymph node positive for malignancy.  Ki-67 15%.  Left breast lumpectomy pathology showed 0.8 cm grade 1 invasive mammary carcinoma with tubular features.  Ki-67 5%.  Both tumors were  strongly ER/PR positive HER2 negative.  Oncotype testing around 3.2 cm tumor came back with a recurrence score of 16.  Patient therefore does not require any adjuvant chemotherapy for her age.  Patient has completed adjuvant radiation therapy.  Baseline bone density scan normal.  Patient started taking letrozole in July 2024  Interval history-patient has had episodes of increased blood pressure with systolic blood pressure ranging between 1 50-1 60 after she started letrozole.  On some occasions her systolic blood pressure did reach 200 as well.  She was on losartan 100 mg daily and was asked to add another 50 mg at night by her primary care doctor.  She also has appointment with cardiology in about a week's time  ECOG PS- 1 Pain scale- 0   Review of systems- Review of Systems  Constitutional:  Negative for chills, fever, malaise/fatigue and weight loss.  HENT:  Negative for congestion, ear discharge and nosebleeds.   Eyes:  Negative for blurred vision.  Respiratory:  Negative for cough, hemoptysis, sputum production, shortness of breath and wheezing.   Cardiovascular:  Negative for chest pain, palpitations, orthopnea and claudication.  Gastrointestinal:  Negative for abdominal pain, blood in stool, constipation, diarrhea, heartburn, melena, nausea and vomiting.  Genitourinary:  Negative for dysuria, flank pain, frequency, hematuria and urgency.  Musculoskeletal:  Negative for back pain, joint pain and myalgias.  Skin:  Negative for rash.  Neurological:  Negative for dizziness, tingling, focal weakness, seizures, weakness and headaches.  Endo/Heme/Allergies:  Does not bruise/bleed easily.  Psychiatric/Behavioral:  Negative for depression and suicidal ideas. The patient does not  have insomnia.       Allergies  Allergen Reactions   Ivp Dye [Iodinated Contrast Media] Anaphylaxis    Topical betadine is ok.   Morphine And Codeine Anaphylaxis    Respiratory Arrest   Shellfish Allergy  Anaphylaxis   Dilaudid [Hydromorphone Hcl]     BP bottomed out   Macrodantin [Nitrofurantoin] Nausea And Vomiting    And diarrhea   Nubain [Nalbuphine Hcl] Other (See Comments)    BP "bottoms out"     Past Medical History:  Diagnosis Date   Arthritis    hands, feet, "all over"   CHF (congestive heart failure) (HCC)    acute, after use of Redux   Complication of anesthesia    on vent after colostomy revision   COPD (chronic obstructive pulmonary disease) (HCC)    chronic bronchitis - no meds   Diabetes mellitus without complication (HCC)    Diverticulitis    Full dentures    upper and lower   GERD (gastroesophageal reflux disease)    Gout    Hypertension    Hypothyroidism    Lichen sclerosus    Vaginal Area   Mitral regurgitation    after use of redux   Right rotator cuff tendonitis    Wears contact lenses      Past Surgical History:  Procedure Laterality Date   ABDOMINAL HYSTERECTOMY  1967   Partial   APPLICATION OF WOUND VAC  02/14/2015   Procedure: APPLICATION OF WOUND VAC;  Surgeon: [name], MD;  Location: [facility];  Service: General;;   BREAST BIOPSY Right 09/12/2022   u/s 10:00 coil  INVASIVE MAMMARY CARCINOMA,   BREAST BIOPSY Left 09/12/2022   2:00 venus [place] bx Invasive ductal carcinoma with tubular features   breast biopsy Left 09/12/2022   [place] bx mass 2:00 venus marker, path pending   BREAST BIOPSY Left 09/12/2022   [place] LT BREAST BX W LOC DEV 1ST LESION IMG BX SPEC US GUIDE 09/12/2022 [facility]   BREAST BIOPSY Right 09/12/2022   [place] RT BREAST BX W LOC DEV 1ST LESION IMG BX SPEC US GUIDE 09/12/2022 [facility]   BREAST BIOPSY  10/02/2022   MM LT RADIOACTIVE SEED LOC MAMMO GUIDE 10/02/2022 [facility]   BREAST BIOPSY  10/02/2022   MM RT RADIOACTIVE SEED LOC MAMMO GUIDE 10/02/2022 [facility]   BREAST EXCISIONAL BIOPSY Left 1974   benign   BREAST LUMPECTOMY WITH RADIOACTIVE SEED AND SENTINEL LYMPH NODE BIOPSY Right 10/02/2022    Procedure: RIGHT BREAST LUMPECTOMY WITH RADIOACTIVE SEED AND RIGHT AXILLARY SENTINEL LYMPH NODE BIOPSY;  Surgeon: Emelia Loron, MD;  Location: [facility];  Service: General;  Laterality: Right;   BREAST LUMPECTOMY WITH RADIOACTIVE SEED LOCALIZATION Left 10/02/2022   Procedure: LEFT BREAST LUMPECTOMY WITH RADIOACTIVE SEED LOCALIZATION;  Surgeon: Emelia Loron, MD;  Location: [facility];  Service: General;  Laterality: Left;   CHOLECYSTECTOMY     COLECTOMY WITH COLOSTOMY CREATION/HARTMANN PROCEDURE  07/03/2014   COLONOSCOPY N/A 11/25/2014   Procedure: COLONOSCOPY;  Surgeon: Midge Minium, MD;  Location: [facility];  Service: Gastroenterology;  Laterality: N/A;   COLOSTOMY TAKEDOWN N/A 01/31/2015   Procedure: COLOSTOMY TAKEDOWN, partial colectomy;  Surgeon: [name], MD;  Location: [facility];  Service: General;  Laterality: N/A;   CORRECTION OVERLAPPING TOES Bilateral 2012   bilateral hammertoes #2 & #3 both feet   HERNIA REPAIR  07/02/2013   JOINT REPLACEMENT Right 2006   knee   JOINT REPLACEMENT Left 2008  knee   KIDNEY SURGERY Right    LAPAROTOMY     construct new colostomy and place wound VAC   LAPAROTOMY N/A 02/14/2015   Procedure: RE-OPEN EXPLORATORY LAPAROTOMY, COMPLEX LYSIS OF ADHESIONS, CONSTRUCTION OF LOOP ILEOSTOMY,;  Surgeon: [name], MD;  Location: [facility];  Service: General;  Laterality: N/A;   POLYPECTOMY  11/25/2014   Procedure: POLYPECTOMY INTESTINAL;  Surgeon: Midge Minium, MD;  Location: [facility];  Service: Gastroenterology;;   TONSILLECTOMY      Social History   Socioeconomic History   Marital status: Widowed    Spouse name: Not on file   Number of children: Not on file   Years of education: Not on file   Highest education level: Not on file  Occupational History   Not on file  Tobacco Use   Smoking status: Former    Current packs/day: 0.00    Types: Cigarettes    Quit date: 07/03/1999    Years since  quitting: 24.2   Smokeless tobacco: Never  Substance and Sexual Activity   Alcohol use: No   Drug use: No   Sexual activity: Never    Birth control/protection: None  Other Topics Concern   Not on file  Social History Narrative   Not on file   Social Drivers of Health   Financial Resource Strain: Medium Risk (02/25/2023)   Received from [facility]   Overall Financial Resource Strain (CARDIA)    Difficulty of Paying Living Expenses: Somewhat hard  Food Insecurity: Food Insecurity Present (02/25/2023)   Received from [facility]   Hunger Vital Sign    Worried About Running Out of Food in the Last Year: Sometimes true    Ran Out of Food in the Last Year: Never true  Transportation Needs: No Transportation Needs (02/25/2023)   Received from [facility]    In the past 12 months, has lack of transportation kept you from medical appointments or from getting medications?: No    Lack of Transportation (Non-Medical): No  Physical Activity: Not on file  Stress: Not on file  Social Connections: Not on file  Intimate Partner Violence: Not on file    Family History  Problem Relation Age of Onset   Hypertension Mother    COPD Mother    Heart disease Mother    Breast cancer Mother 49   Heart disease Father    COPD Father    Brain cancer Sister 7       tumor   Lung cancer Brother 31   Colon cancer Paternal Aunt        d. 65s   Juvenile Diabetes Son      Current Outpatient Medications:    allopurinol (ZYLOPRIM) 100 MG tablet, Take 100 mg by mouth daily. AM, Disp: , Rfl:    Blood Glucose Monitoring Suppl (ONE TOUCH ULTRA 2) w/Device KIT, daily., Disp: , Rfl:    Calcium Carb-Cholecalciferol (CALCIUM 600 + D PO), Take 600 mg elemental calcium/kg/hr by mouth daily. , Disp: , Rfl:    clobetasol cream (TEMOVATE) 0.05 %, Apply 1 application topically 2 (two) times daily., Disp: , Rfl:    diazepam (VALIUM) 5  MG tablet, Take 1 tablet (5 mg) 1 hour prior to MRI, may take second dose just before MRI if needed., Disp: , Rfl:    diphenoxylate-atropine (LOMOTIL) 2.5-0.025 MG tablet, Take by mouth., Disp: , Rfl:    famotidine (PEPCID) 20 MG tablet,  Take 20 mg by mouth 2 (two) times daily., Disp: , Rfl:    fluticasone (FLONASE) 50 MCG/ACT nasal spray, Place 2 sprays into the nose daily., Disp: , Rfl:    furosemide (LASIX) 20 MG tablet, Take 20 mg by mouth every morning. AM, Disp: , Rfl:    ipratropium (ATROVENT) 0.03 % nasal spray, Place 2 sprays into both nostrils every 12 (twelve) hours., Disp: , Rfl:    Lancets (ONETOUCH DELICA PLUS LANCET30G) MISC, daily., Disp: , Rfl:    letrozole (FEMARA) 2.5 MG tablet, Take 1 tablet by mouth once daily, Disp: 90 tablet, Rfl: 0   levothyroxine (SYNTHROID) 88 MCG tablet, Take by mouth., Disp: , Rfl:    losartan (COZAAR) 100 MG tablet, Take 100 mg by mouth every morning., Disp: , Rfl:    losartan (COZAAR) 50 MG tablet, Take 1 tablet by mouth at bedtime., Disp: , Rfl:    meloxicam (MOBIC) 7.5 MG tablet, Take by mouth., Disp: , Rfl:    metoprolol (LOPRESSOR) 50 MG tablet, Take 50 mg by mouth 2 (two) times daily., Disp: , Rfl:    MIEBO 1.338 GM/ML SOLN, Apply 1 drop to eye 2 (two) times daily., Disp: , Rfl:    moxifloxacin (VIGAMOX) 0.5 % ophthalmic solution, Apply to eye., Disp: , Rfl:    Multiple Vitamin (MULTIVITAMIN) tablet, Take 1 tablet by mouth daily., Disp: , Rfl:    ONETOUCH ULTRA TEST test strip, daily., Disp: , Rfl:    pantoprazole (PROTONIX) 20 MG tablet, Take 20 mg by mouth daily., Disp: , Rfl:    Potassium 99 MG TABS, Take 1 tablet by mouth daily., Disp: , Rfl:    potassium chloride (KLOR-CON) 10 MEQ tablet, Take 10 mEq by mouth daily., Disp: , Rfl:    silver sulfADIAZINE (SILVADENE) 1 % cream, Apply 1 Application topically daily., Disp: 50 g, Rfl: 1  Physical exam:  Vitals:   09/30/23 1013  BP: (!) 138/42  Pulse: 61  Resp: 19  Temp: (!) 97 F (36.1  C)  TempSrc: Tympanic  SpO2: 99%  Weight: 208 lb 6.4 oz (94.5 kg)  Height: 5\' 1"  (1.549 m)   Physical Exam Eyes:     Pupils: Pupils are equal, round, and reactive to light.  Cardiovascular:     Rate and Rhythm: Normal rate and regular rhythm.     Heart sounds: Normal heart sounds.  Pulmonary:     Effort: Pulmonary effort is normal.     Breath sounds: Normal breath sounds.  Skin:    General: Skin is warm and dry.  Neurological:     Mental Status: She is alert and oriented to person, place, and time.      I have personally reviewed labs listed below:    Latest Ref Rng & Units 01/22/2023   10:33 AM  CMP  Glucose 70 - 99 mg/dL 409   BUN 8 - 23 mg/dL 30   Creatinine 8.11 - 1.00 mg/dL 9.14   Sodium 782 - 956 mmol/L 138   Potassium 3.5 - 5.1 mmol/L 4.6   Chloride 98 - 111 mmol/L 105   CO2 22 - 32 mmol/L 25   Calcium 8.9 - 10.3 mg/dL 9.0   Total Protein 6.5 - 8.1 g/dL 6.9   Total Bilirubin 0.3 - 1.2 mg/dL 0.4   Alkaline Phos 38 - 126 U/L 59   AST 15 - 41 U/L 19   ALT 0 - 44 U/L 13       Latest Ref Rng &  Units 12/13/2022   10:04 AM  CBC  WBC 4.0 - 10.5 K/uL 5.2   Hemoglobin 12.0 - 15.0 g/dL 16.1   Hematocrit 09.6 - 46.0 % 41.6   Platelets 150 - 400 K/uL 148    I have personally reviewed Radiology images listed below: No images are attached to the encounter.  MM DIAG BREAST TOMO BILATERAL Result Date: 09/20/2023 CLINICAL DATA:  Status post bilateral lumpectomy with radiation in April 2024. On letrozole. EXAM: DIGITAL DIAGNOSTIC BILATERAL MAMMOGRAM WITH TOMOSYNTHESIS AND CAD TECHNIQUE: Bilateral digital diagnostic mammography and breast tomosynthesis was performed. The images were evaluated with computer-aided detection. COMPARISON:  Previous exam(s). ACR Breast Density Category b: There are scattered areas of fibroglandular density. FINDINGS: There is density and architectural distortion within the RIGHT breast, consistent with postsurgical changes. These are new in  comparison to prior. There is density and architectural distortion within the LEFT breast, consistent with postsurgical changes. These are new in comparison to prior. No suspicious mass, distortion, or microcalcifications are identified to suggest presence of malignancy. IMPRESSION: No mammographic evidence of malignancy bilaterally. RECOMMENDATION: Recommend bilateral diagnostic mammogram (with RIGHT and LEFT breast ultrasound if deemed necessary) in 1 year. I have discussed the findings and recommendations with the patient. If applicable, a reminder letter will be sent to the patient regarding the next appointment. BI-RADS CATEGORY  2: Benign. Electronically Signed   By: Meda Klinefelter M.D.   On: 09/20/2023 11:49   MR BRAIN WO CONTRAST Result Date: 09/13/2023 CLINICAL DATA:  Provided history: New onset of headaches after age 1. Additional history provided by the scanning technologist: frontal headaches. EXAM: MRI HEAD WITHOUT CONTRAST TECHNIQUE: Multiplanar, multiecho pulse sequences of the brain and surrounding structures were obtained without intravenous contrast. COMPARISON:  Brain MRI 09/07/2022. FINDINGS: Brain: Mild generalized cerebral atrophy. Multifocal T2 FLAIR hyperintense signal abnormality within the cerebral white matter, nonspecific but compatible with mild chronic small vessel ischemic disease. No cortical encephalomalacia is identified. There is no acute infarct. No evidence of an intracranial mass. No chronic intracranial blood products. No extra-axial fluid collection. No midline shift. Vascular: Maintained flow voids within the proximal large arterial vessels. Skull and upper cervical spine: No focal worrisome marrow lesion. Sinuses/Orbits: No mass or acute finding within the imaged orbits. Prior right ocular lens replacement. No significant paranasal sinus disease. IMPRESSION: 1.  No evidence of an acute intracranial abnormality. 2. Mild chronic small vessel ischemic changes within the  cerebral white matter, similar to the prior brain MRI of 09/07/2022. 3. Mild generalized cerebral atrophy. Electronically Signed   By: Jackey Loge D.O.   On: 09/13/2023 09:55     Assessment and plan- Patient is a 83 y.o. female with history of bilateral ER/PR positive HER2 negative breast cancer presently on letrozole here for routine follow-up  Letrozole can have an adverse cardiovascular profile and can cause hypertension in about 6 to 8% of the cases.  Have asked her to hold off on taking letrozole for the next 4 weeks and let me know how her blood pressure readings are.  She will also discuss blood pressure management further with her cardiologist.  If her blood pressure readings are much improved after stopping letrozole we could consider switching her to an alternative AI such as exemestane.  Sometimes switching an AI can result in improvement of the symptoms.  However if she continues to have blood pressure issues with aromatase inhibitors we will need to consider holding her endocrine therapy altogether.  I will see her back  in 6 months no labs.  Her baseline bone density scan is normal and mammogram from March 2025 was unremarkable   Visit Diagnosis 1. Encounter for follow-up surveillance of breast cancer   2. Use of letrozole (Femara)   3. High risk medication use      Dr. Owens Shark, MD, MPH [facility] [phone] 09/30/2023 10:45 AM

## 2023-10-15 ENCOUNTER — Other Ambulatory Visit (INDEPENDENT_AMBULATORY_CARE_PROVIDER_SITE_OTHER): Payer: Self-pay | Admitting: Vascular Surgery

## 2023-10-15 DIAGNOSIS — M79605 Pain in left leg: Secondary | ICD-10-CM

## 2023-10-21 ENCOUNTER — Encounter (INDEPENDENT_AMBULATORY_CARE_PROVIDER_SITE_OTHER)

## 2023-10-21 ENCOUNTER — Ambulatory Visit (INDEPENDENT_AMBULATORY_CARE_PROVIDER_SITE_OTHER): Admitting: Vascular Surgery

## 2023-11-10 ENCOUNTER — Other Ambulatory Visit: Payer: Self-pay | Admitting: Oncology

## 2023-11-11 NOTE — Telephone Encounter (Signed)
 Yes please do that before I refill letrozole 

## 2023-11-11 NOTE — Telephone Encounter (Signed)
 Outbound call to patient; informed script was sent to [facility] on [address] in [place].  Patient verbalized understanding.

## 2023-11-11 NOTE — Telephone Encounter (Signed)
 Outbound call to patient to inquire about recent blood pressure readings.  Patient checks blood pressure twice daily; blood pressure readings are as follows: 150/79, 166/78, 156/74, 150/76, 163/89.  Patient also states she was put in a third blood pressure pill (Amlodipine 5mg ).  Patient does not believe higher blood pressure readings are due to Letrozole ; patient confirmed she has not taken Letrozole  since instructed at last office visit 09/30/23.  Informed patient I would relay this information to Dr. Randy Buttery and follow up with her shortly; patient verbalized understanding.

## 2023-11-12 DIAGNOSIS — Z87442 Personal history of urinary calculi: Secondary | ICD-10-CM | POA: Insufficient documentation

## 2023-11-15 ENCOUNTER — Ambulatory Visit: Payer: Medicare HMO | Admitting: Oncology

## 2023-11-19 ENCOUNTER — Encounter (INDEPENDENT_AMBULATORY_CARE_PROVIDER_SITE_OTHER): Payer: Self-pay

## 2023-11-21 ENCOUNTER — Emergency Department

## 2023-11-21 ENCOUNTER — Emergency Department
Admission: EM | Admit: 2023-11-21 | Discharge: 2023-11-21 | Disposition: A | Discharge status: Home / Self Care | Attending: Emergency Medicine | Admitting: Emergency Medicine

## 2023-11-21 ENCOUNTER — Other Ambulatory Visit: Payer: Self-pay

## 2023-11-21 DIAGNOSIS — R109 Unspecified abdominal pain: Secondary | ICD-10-CM | POA: Insufficient documentation

## 2023-11-21 DIAGNOSIS — R110 Nausea: Secondary | ICD-10-CM | POA: Insufficient documentation

## 2023-11-21 DIAGNOSIS — E119 Type 2 diabetes mellitus without complications: Secondary | ICD-10-CM | POA: Insufficient documentation

## 2023-11-21 DIAGNOSIS — I509 Heart failure, unspecified: Secondary | ICD-10-CM | POA: Diagnosis not present

## 2023-11-21 DIAGNOSIS — R531 Weakness: Secondary | ICD-10-CM | POA: Insufficient documentation

## 2023-11-21 DIAGNOSIS — I110 Hypertensive heart disease with heart failure: Secondary | ICD-10-CM | POA: Insufficient documentation

## 2023-11-21 DIAGNOSIS — J449 Chronic obstructive pulmonary disease, unspecified: Secondary | ICD-10-CM | POA: Insufficient documentation

## 2023-11-21 LAB — COMPREHENSIVE METABOLIC PANEL WITH GFR
ALT: 17 U/L (ref 0–44)
AST: 22 U/L (ref 15–41)
Albumin: 3.9 g/dL (ref 3.5–5.0)
Alkaline Phosphatase: 61 U/L (ref 38–126)
Anion gap: 5 (ref 5–15)
BUN: 55 mg/dL — ABNORMAL HIGH (ref 8–23)
CO2: 27 mmol/L (ref 22–32)
Calcium: 9.5 mg/dL (ref 8.9–10.3)
Chloride: 107 mmol/L (ref 98–111)
Creatinine, Ser: 1.49 mg/dL — ABNORMAL HIGH (ref 0.44–1.00)
GFR, Estimated: 35 mL/min — ABNORMAL LOW (ref >60)
Glucose, Bld: 127 mg/dL — ABNORMAL HIGH (ref 70–99)
Potassium: 4.5 mmol/L (ref 3.5–5.1)
Sodium: 139 mmol/L (ref 135–145)
Total Bilirubin: 0.7 mg/dL (ref 0.0–1.2)
Total Protein: 7.3 g/dL (ref 6.5–8.1)

## 2023-11-21 LAB — URINALYSIS, ROUTINE W REFLEX MICROSCOPIC
Bilirubin Urine: NEGATIVE
Glucose, UA: NEGATIVE mg/dL
Hgb urine dipstick: NEGATIVE
Ketones, ur: NEGATIVE mg/dL
Nitrite: NEGATIVE
Protein, ur: NEGATIVE mg/dL
RBC / HPF: 0 RBC/hpf (ref 0–5)
Specific Gravity, Urine: 1.017 (ref 1.005–1.030)
pH: 6 (ref 5.0–8.0)

## 2023-11-21 LAB — LIPASE, BLOOD: Lipase: 42 U/L (ref 11–51)

## 2023-11-21 LAB — CBC
HCT: 40 % (ref 36.0–46.0)
Hemoglobin: 13 g/dL (ref 12.0–15.0)
MCH: 31.7 pg (ref 26.0–34.0)
MCHC: 32.5 g/dL (ref 30.0–36.0)
MCV: 97.6 fL (ref 80.0–100.0)
Platelets: 156 10*3/uL (ref 150–400)
RBC: 4.1 MIL/uL (ref 3.87–5.11)
RDW: 13.1 % (ref 11.5–15.5)
WBC: 8.9 10*3/uL (ref 4.0–10.5)
nRBC: 0 % (ref 0.0–0.2)

## 2023-11-21 MED ORDER — FENTANYL CITRATE PF 50 MCG/ML IJ SOSY
50.0000 ug | PREFILLED_SYRINGE | Freq: Once | INTRAMUSCULAR | Status: AC
  Administered 2023-11-21: 50 ug via INTRAVENOUS
  Filled 2023-11-21: qty 1

## 2023-11-21 MED ORDER — HYDROCODONE-ACETAMINOPHEN 5-325 MG PO TABS: 1.0000 | ORAL_TABLET | ORAL | 0 refills | Status: AC | PRN

## 2023-11-21 MED ORDER — ONDANSETRON HCL 4 MG/2ML IJ SOLN
4.0000 mg | Freq: Once | INTRAMUSCULAR | Status: AC
  Administered 2023-11-21: 4 mg via INTRAVENOUS
  Filled 2023-11-21: qty 2

## 2023-11-21 MED ORDER — ONDANSETRON 4 MG PO TBDP: 4.0000 mg | ORAL_TABLET | Freq: Three times a day (TID) | ORAL | 0 refills | Status: AC | PRN

## 2023-11-21 MED ORDER — SODIUM CHLORIDE 0.9 % IV SOLN: Freq: Once | INTRAVENOUS | Status: AC

## 2023-11-21 NOTE — Discharge Instructions (Addendum)
 As we discussed please drink plenty of fluids.  Obtain plenty of rest.  Follow-up with your doctor in the next couple days for recheck/reevaluation.  Return to the emergency department for any worsening symptoms, fever, pains or any other symptom personally concerning to yourself.  You have been prescribed a pain medication.  Please only take as prescribed.  Do not drink alcohol or drive while taking this medication.  You have also been prescribed a nausea medication that you may use as needed, as written.

## 2023-11-21 NOTE — ED Triage Notes (Addendum)
 Pt reports that she is currently on augmentin for a uti, urine culture grew out klebsiella and proteous. Pt states that she has been nauseated, always has diarrhea, but feels weak and is sleeping a lot, pt also states right flank pain, hx of having a stone removed from the right kidney in the past

## 2023-11-21 NOTE — ED Provider Notes (Signed)
-----------------------------------------   6:43 PM on 11/21/2023 ----------------------------------------- Patient care assumed from Dr. Martina Sledge.  Patient's CBC is reassuring, chemistry reassuring, lipase is normal.  Patient has chronic kidney disease but not significantly changed from baseline.  Urinalysis shows no significant finding.  Patient is currently on antibiotics for urinary tract infection.  Patient CT scan has resulted showing no significant finding.  Patient was able to tolerate drinking in the emergency department.  I discussed our workup as well as supportive care at home with the patient and daughter.  Will prescribe a short course of pain medication for the patient for back pain as well as nausea medication.  Patient will follow-up with her doctor.  Discussed return precautions.  Patient and daughter agreeable to this plan.   Ruth Cove, MD 11/21/23 1843

## 2023-11-29 NOTE — ED Provider Notes (Signed)
 [facility] Provider Note    Event Date/Time   First MD Initiated Contact with Patient 11/21/23 1516     (approximate)   History   Nausea weakness   HPI  Martha Chung is a 83 y.o. female with a history of COPD, CHF, diverticulitis, diabetes, hypertension who presents with complaints of nausea, weakness.  Has been taking Augmentin for UTI complains of some mild right flank pain as well.  No fevers reported     Physical Exam   Triage Vital Signs: ED Triage Vitals  Encounter Vitals Group     BP 11/21/23 1201 129/74     Systolic BP Percentile --      Diastolic BP Percentile --      Pulse Rate 11/21/23 1201 88     Resp 11/21/23 1201 16     Temp 11/21/23 1201 97.6 F (36.4 C)     Temp Source 11/21/23 1201 Oral     SpO2 11/21/23 1201 100 %     Weight 11/21/23 1202 91.6 kg (202 lb)     Height 11/21/23 1202 1.524 m (5')     Head Circumference --      Peak Flow --      Pain Score 11/21/23 1201 7     Pain Loc --      Pain Education --      Exclude from Growth Chart --     Most recent vital signs: Vitals:   11/21/23 1800 11/21/23 1830  BP: (!) 106/52 (!) 113/50  Pulse: 79 75  Resp:    Temp:    SpO2: 100% 96%     General: Awake, no distress.  CV:  Good peripheral perfusion.  Resp:  Normal effort.  Abd:  No distention.  Soft, nontender, no CVA tenderness Other:     ED Results / Procedures / Treatments   Labs (all labs ordered are listed, but only abnormal results are displayed) Labs Reviewed  COMPREHENSIVE METABOLIC PANEL WITH GFR - Abnormal; Notable for the following components:      Result Value   Glucose, Bld 127 (*)    BUN 55 (*)    Creatinine, Ser 1.49 (*)    GFR, Estimated 35 (*)    All other components within normal limits  URINALYSIS, ROUTINE W REFLEX MICROSCOPIC - Abnormal; Notable for the following components:   Color, Urine YELLOW (*)    APPearance CLEAR (*)    Leukocytes,Ua TRACE (*)    Bacteria, UA RARE (*)     All other components within normal limits  LIPASE, BLOOD  CBC     EKG  ED ECG REPORT I, Bryson Carbine, the attending physician, personally viewed and interpreted this ECG.  Date: 11/29/2023  Rhythm: normal sinus rhythm QRS Axis: normal Intervals: normal ST/T Wave abnormalities: normal Narrative Interpretation: no evidence of acute ischemia    RADIOLOGY CT renal stone study negative    PROCEDURES:  Critical Care performed:   Procedures   MEDICATIONS ORDERED IN ED: Medications  0.9 %  sodium chloride  infusion (0 mLs Intravenous Stopped 11/21/23 1822)  ondansetron  (ZOFRAN ) injection 4 mg (4 mg Intravenous Given 11/21/23 1558)  fentaNYL  (SUBLIMAZE ) injection 50 mcg (50 mcg Intravenous Given 11/21/23 1558)  fentaNYL  (SUBLIMAZE ) injection 50 mcg (50 mcg Intravenous Given 11/21/23 1754)     IMPRESSION / MDM / ASSESSMENT AND PLAN / ED COURSE  I reviewed the triage vital signs and the nursing notes. Patient's presentation is most consistent with acute presentation with  potential threat to life or bodily function.  Patient resents with nausea weakness, currently being treated for UTI.  Differential includes UTI treatment failure, medication side effect, kidney stone, infected kidney stone  Will treat with IV fluids, IV fentanyl , IV fluids, obtain CT renal stone study and reevaluate  Lab work thus far is reassuring, BUN/creatinine in line with prior levels.  Have asked my colleague to follow-up on CT scan and reevaluate the patient.        FINAL CLINICAL IMPRESSION(S) / ED DIAGNOSES   Final diagnoses:  Weakness     Rx / DC Orders   ED Discharge Orders          Ordered    HYDROcodone -acetaminophen  (NORCO/VICODIN) 5-325 MG tablet  Every 4 hours PRN        11/21/23 1847    ondansetron  (ZOFRAN -ODT) 4 MG disintegrating tablet  Every 8 hours PRN        11/21/23 1847             Note:  This document was prepared using Dragon voice recognition software and  may include unintentional dictation errors.   Bryson Carbine, MD 11/29/23 [phone]

## 2024-02-05 ENCOUNTER — Other Ambulatory Visit: Payer: Self-pay | Admitting: Oncology

## 2024-02-05 ENCOUNTER — Encounter: Payer: Self-pay | Admitting: Radiation Oncology

## 2024-02-05 ENCOUNTER — Ambulatory Visit
Admission: RE | Admit: 2024-02-05 | Discharge: 2024-02-05 | Disposition: A | Discharge status: Home / Self Care | Payer: Medicare HMO | Source: Ambulatory Visit | Attending: Radiation Oncology | Admitting: Radiation Oncology

## 2024-02-05 VITALS — BP 132/55 | HR 65 | Temp 97.6°F | Resp 16 | Wt 205.0 lb

## 2024-02-05 DIAGNOSIS — Z923 Personal history of irradiation: Secondary | ICD-10-CM | POA: Diagnosis not present

## 2024-02-05 DIAGNOSIS — C50911 Malignant neoplasm of unspecified site of right female breast: Secondary | ICD-10-CM | POA: Diagnosis present

## 2024-02-05 DIAGNOSIS — Z1721 Progesterone receptor positive status: Secondary | ICD-10-CM | POA: Insufficient documentation

## 2024-02-05 DIAGNOSIS — Z170 Estrogen receptor positive status [ER+]: Secondary | ICD-10-CM | POA: Diagnosis not present

## 2024-02-05 DIAGNOSIS — Z79811 Long term (current) use of aromatase inhibitors: Secondary | ICD-10-CM | POA: Insufficient documentation

## 2024-02-05 NOTE — Progress Notes (Signed)
 Radiation Oncology Follow up Note  Name: Martha Chung   Date:   02/05/2024 MRN:  [id number] DOB: 09/22/1940    This 83 y.o. 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  REFERRING PROVIDER: Johnie Perkins, PA-C  HPI: Patient is a 83 year old female now out 13 months having completed whole breast radiation to her right breast for stage IIb ER positive invasive mammary carcinoma.  Seen today in routine follow-up she is doing well she specifically denies breast tenderness cough or bone pain.  She is currently on.  Letrozole  although she states this may be causing some loss of taste.  She had mammograms back in March which I reviewed were BI-RADS 2 benign.  COMPLICATIONS OF TREATMENT: none  FOLLOW UP COMPLIANCE: keeps appointments   PHYSICAL EXAM:  BP (!) 132/55 (BP Location: Left Arm, Patient Position: Sitting)   Pulse 65   Temp 97.6 F (36.4 C) (Tympanic)   Resp 16   Wt 205 lb (93 kg)   SpO2 99%   BMI 40.04 kg/m  Lungs are clear to A&P cardiac examination essentially unremarkable with regular rate and rhythm. No dominant mass or nodularity is noted in either breast in 2 positions examined. Incision is well-healed. No axillary or supraclavicular adenopathy is appreciated. Cosmetic result is excellent.  Well-developed well-nourished patient in NAD. HEENT reveals PERLA, EOMI, discs not visualized.  Oral cavity is clear. No oral mucosal lesions are identified. Neck is clear without evidence of cervical or supraclavicular adenopathy. Lungs are clear to A&P. Cardiac examination is essentially unremarkable with regular rate and rhythm without murmur rub or thrill. Abdomen is benign with no organomegaly or masses noted. Motor sensory and DTR levels are equal and symmetric in the upper and lower extremities. Cranial nerves II through XII are grossly intact.  Proprioception is intact. No peripheral adenopathy or edema is identified. No motor or sensory levels are noted. Crude visual fields are within normal range.  RADIOLOGY RESULTS: Mammograms reviewed compatible with above-stated findings  PLAN: Present time patient is doing well with no evidence of disease.  I have asked her to stop her letrozole  for 2 weeks to see if it changes her loss of taste.  She also can discuss this with Dr. Melanee in her next meeting.  She continues to do well with no evidence of disease.  Based on her age and currently seeing Dr. Melanee twice a year I will turn follow-up care over to medical oncology.  I would be happy to reevaluate that at any time should that be indicated.  I would like to take this opportunity to thank you for allowing me to participate in the care of your patient.SABRA Marcey Penton, MD

## 2024-03-31 ENCOUNTER — Inpatient Hospital Stay: Attending: Oncology | Admitting: Nurse Practitioner

## 2024-03-31 ENCOUNTER — Other Ambulatory Visit: Payer: Self-pay | Admitting: *Deleted

## 2024-03-31 ENCOUNTER — Encounter: Payer: Self-pay | Admitting: Nurse Practitioner

## 2024-03-31 VITALS — BP 102/51 | HR 58 | Temp 98.2°F | Resp 16 | Wt 203.0 lb

## 2024-03-31 DIAGNOSIS — Z853 Personal history of malignant neoplasm of breast: Secondary | ICD-10-CM

## 2024-03-31 DIAGNOSIS — Z170 Estrogen receptor positive status [ER+]: Secondary | ICD-10-CM | POA: Diagnosis not present

## 2024-03-31 DIAGNOSIS — Z1721 Progesterone receptor positive status: Secondary | ICD-10-CM | POA: Insufficient documentation

## 2024-03-31 DIAGNOSIS — Z5181 Encounter for therapeutic drug level monitoring: Secondary | ICD-10-CM

## 2024-03-31 DIAGNOSIS — Z923 Personal history of irradiation: Secondary | ICD-10-CM | POA: Insufficient documentation

## 2024-03-31 DIAGNOSIS — Z801 Family history of malignant neoplasm of trachea, bronchus and lung: Secondary | ICD-10-CM | POA: Insufficient documentation

## 2024-03-31 DIAGNOSIS — C50919 Malignant neoplasm of unspecified site of unspecified female breast: Secondary | ICD-10-CM | POA: Insufficient documentation

## 2024-03-31 DIAGNOSIS — R300 Dysuria: Secondary | ICD-10-CM | POA: Insufficient documentation

## 2024-03-31 DIAGNOSIS — Z79811 Long term (current) use of aromatase inhibitors: Secondary | ICD-10-CM | POA: Diagnosis not present

## 2024-03-31 DIAGNOSIS — Z803 Family history of malignant neoplasm of breast: Secondary | ICD-10-CM | POA: Insufficient documentation

## 2024-03-31 DIAGNOSIS — Z808 Family history of malignant neoplasm of other organs or systems: Secondary | ICD-10-CM | POA: Diagnosis not present

## 2024-03-31 DIAGNOSIS — Z08 Encounter for follow-up examination after completed treatment for malignant neoplasm: Secondary | ICD-10-CM

## 2024-03-31 DIAGNOSIS — Z1732 Human epidermal growth factor receptor 2 negative status: Secondary | ICD-10-CM | POA: Diagnosis not present

## 2024-03-31 LAB — URINALYSIS, COMPLETE (UACMP) WITH MICROSCOPIC
Bilirubin Urine: NEGATIVE
Glucose, UA: NEGATIVE mg/dL
Hgb urine dipstick: NEGATIVE
Ketones, ur: NEGATIVE mg/dL
Nitrite: NEGATIVE
Protein, ur: NEGATIVE mg/dL
Specific Gravity, Urine: 1.008 (ref 1.005–1.030)
pH: 5 (ref 5.0–8.0)

## 2024-03-31 MED ORDER — ESTRADIOL 0.1 MG/GM VA CREA: TOPICAL_CREAM | VAGINAL | 12 refills | Status: AC

## 2024-03-31 NOTE — Progress Notes (Signed)
 Hematology/Oncology Consult Note [facility]  [phone] [phone]  Patient Care Team: Johnie Perkins, PA-C as PCP - General (Family Medicine) Georgina Shasta POUR, RN as Oncology Nurse Navigator Lenn Aran, MD as Consulting Physician (Radiation Oncology) Melanee Annah BROCKS, MD as Consulting Physician (Oncology) Ebbie Cough, MD as Consulting Physician (General Surgery)   Name of the patient: Martha Chung  [id number]  1940-09-12   Date of visit: 03/31/24  Diagnosis- bilateral stage I breast cancer ER/PR positive HER2 negative   Chief complaint/ Reason for visit-routine follow-up of breast cancer on letrozole   Heme/Onc history: patient is a 58 year oldFemale who had undergone screening mammograms up until the age of 30.  She self palpated a right breast mass which led to a bilateral diagnostic mammogram.  This showed a 1.6 cm mass in the 10 o'clock position of the right breast and a 4 mm irregular mass in the outer upper quadrant of the left breast.  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  Left breast biopsy showed grade 1 invasive mammary carcinoma with tubular features ER/PR and HER2 pending.   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  She is independent of her ADLs and IADLs.  No prior history of breast cancer although she has had a left lumpectomy for benign lesion many years ago.  Family history of breast cancer in her mother at the age of 81.   Patient underwent bilateral lumpectomy with sentinel lymph node biopsy.  Right breast pathology from April 2024 showed 3.2 cm grade 2 invasive mammary carcinoma with negative margins.  1 sentinel lymph node positive for malignancy.  Ki-67 15%.  Left breast lumpectomy pathology showed 0.8 cm grade 1 invasive mammary carcinoma with tubular features.  Ki-67 5%.  Both tumors were strongly  ER/PR positive HER2 negative.  Oncotype testing around 3.2 cm tumor came back with a recurrence score of 16.  Patient therefore does not require any adjuvant chemotherapy for her age.  Patient has completed adjuvant radiation therapy.  Baseline bone density scan normal.  Patient started taking letrozole  in July 2024  Interval history-patient is an 83 year old female who returns to clinic for routine follow-up and continued surveillance of her breast cancer.  She denies any new breast concerns including skin changes, asymmetry, pain, discharge, lumps.  She denies unintentional weight loss or abnormal bone pain. She complains of dysuria and has frequent UTI.   ECOG PS- 1 Pain scale- 0  Review of systems- Review of Systems  Constitutional:  Negative for chills, fever, malaise/fatigue and weight loss.  HENT:  Negative for congestion, ear discharge and nosebleeds.   Eyes:  Negative for blurred vision.  Respiratory:  Negative for cough, hemoptysis, sputum production, shortness of breath and wheezing.   Cardiovascular:  Negative for chest pain, palpitations, orthopnea and claudication.  Gastrointestinal:  Negative for abdominal pain, blood in stool, constipation, diarrhea, heartburn, melena, nausea and vomiting.  Genitourinary:  Positive for dysuria, frequency and urgency. Negative for flank pain and hematuria.  Musculoskeletal:  Negative for back pain, joint pain and myalgias.  Skin:  Negative for rash.  Neurological:  Negative for dizziness, tingling, focal weakness, seizures, weakness and headaches.  Endo/Heme/Allergies:  Does not bruise/bleed easily.  Psychiatric/Behavioral:  Negative for depression and suicidal ideas. The patient does not have insomnia.     Allergies  Allergen Reactions   Buprenorphine Hcl Anaphylaxis  Respiratory Arrest   Ivp Dye [Iodinated Contrast Media] Anaphylaxis    Topical betadine is ok.   Morphine And Codeine Anaphylaxis    Respiratory Arrest   Shellfish Allergy  Anaphylaxis and Swelling    Throat closes   Dilaudid  [Hydromorphone  Hcl]     BP bottomed out   Macrodantin [Nitrofurantoin] Nausea And Vomiting    And diarrhea   Nalbuphine Other (See Comments)    BP bottoms out   Nubain [Nalbuphine Hcl] Other (See Comments)    BP bottoms out   Past Medical History:  Diagnosis Date   Arthritis    hands, feet, all over   CHF (congestive heart failure) (HCC)    acute, after use of Redux   Complication of anesthesia    on vent after colostomy revision   COPD (chronic obstructive pulmonary disease) (HCC)    chronic bronchitis - no meds   Diabetes mellitus without complication (HCC)    Diverticulitis    Full dentures    upper and lower   GERD (gastroesophageal reflux disease)    Gout    Hypertension    Hypothyroidism    Lichen sclerosus    Vaginal Area   Mitral regurgitation    after use of redux   Right rotator cuff tendonitis    Wears contact lenses    Past Surgical History:  Procedure Laterality Date   ABDOMINAL HYSTERECTOMY  1967   Partial   APPLICATION OF WOUND VAC  02/14/2015   Procedure: APPLICATION OF WOUND VAC;  Surgeon: Oneil Chang, MD;  Location: [facility];  Service: General;;   BREAST BIOPSY Right 09/12/2022   u/s 10:00 coil  INVASIVE MAMMARY CARCINOMA,   BREAST BIOPSY Left 09/12/2022   2:00 venus us  bx Invasive ductal carcinoma with tubular features   breast biopsy Left 09/12/2022   us  bx mass 2:00 venus marker, path pending   BREAST BIOPSY Left 09/12/2022   US  LT BREAST BX W LOC DEV 1ST LESION IMG BX SPEC US  GUIDE 09/12/2022 [facility]   BREAST BIOPSY Right 09/12/2022   US  RT BREAST BX W LOC DEV 1ST LESION IMG BX SPEC US  GUIDE 09/12/2022 [facility]   BREAST BIOPSY  10/02/2022   MM LT RADIOACTIVE SEED LOC MAMMO GUIDE 10/02/2022 [facility]   BREAST BIOPSY  10/02/2022   MM RT RADIOACTIVE SEED LOC MAMMO GUIDE 10/02/2022 [facility]   BREAST EXCISIONAL BIOPSY Left 1974   benign   BREAST LUMPECTOMY  WITH RADIOACTIVE SEED AND SENTINEL LYMPH NODE BIOPSY Right 10/02/2022   Procedure: RIGHT BREAST LUMPECTOMY WITH RADIOACTIVE SEED AND RIGHT AXILLARY SENTINEL LYMPH NODE BIOPSY;  Surgeon: Ebbie Cough, MD;  Location: [facility];  Service: General;  Laterality: Right;   BREAST LUMPECTOMY WITH RADIOACTIVE SEED LOCALIZATION Left 10/02/2022   Procedure: LEFT BREAST LUMPECTOMY WITH RADIOACTIVE SEED LOCALIZATION;  Surgeon: Ebbie Cough, MD;  Location: [facility];  Service: General;  Laterality: Left;   CHOLECYSTECTOMY     COLECTOMY WITH COLOSTOMY CREATION/HARTMANN PROCEDURE  07/03/2014   COLONOSCOPY N/A 11/25/2014   Procedure: COLONOSCOPY;  Surgeon: Rogelia Copping, MD;  Location: [facility];  Service: Gastroenterology;  Laterality: N/A;   COLOSTOMY TAKEDOWN N/A 01/31/2015   Procedure: COLOSTOMY TAKEDOWN, partial colectomy;  Surgeon: Oneil Chang, MD;  Location: [facility];  Service: General;  Laterality: N/A;   CORRECTION OVERLAPPING TOES Bilateral 2012   bilateral hammertoes #2 & #3 both feet   HERNIA REPAIR  07/02/2013   JOINT REPLACEMENT Right 2006   knee  JOINT REPLACEMENT Left 2008   knee   KIDNEY SURGERY Right    LAPAROTOMY     construct new colostomy and place wound VAC   LAPAROTOMY N/A 02/14/2015   Procedure: RE-OPEN EXPLORATORY LAPAROTOMY, COMPLEX LYSIS OF ADHESIONS, CONSTRUCTION OF LOOP ILEOSTOMY,;  Surgeon: Oneil Chang, MD;  Location: [facility];  Service: General;  Laterality: N/A;   POLYPECTOMY  11/25/2014   Procedure: POLYPECTOMY INTESTINAL;  Surgeon: Rogelia Copping, MD;  Location[facility];  Service: Gastroenterology;;   TONSILLECTOMY     Social History   Socioeconomic History   Marital status: Widowed    Spouse name: Not on file   Number of children: Not on file   Years of education: Not on file   Highest education level: Not on file  Occupational History   Not on file  Tobacco Use   Smoking status: Former    Current  packs/day: 0.00    Types: Cigarettes    Quit date: 07/03/1999    Years since quitting: 24.7   Smokeless tobacco: Never  Substance and Sexual Activity   Alcohol use: No   Drug use: No   Sexual activity: Never    Birth control/protection: None  Other Topics Concern   Not on file  Social History Narrative   Not on file   Social Drivers of Health   Financial Resource Strain: Medium Risk (02/26/2024)   Received from [facility]   Overall Financial Resource Strain (CARDIA)    Difficulty of Paying Living Expenses: Somewhat hard  Food Insecurity: No Food Insecurity (02/26/2024)   Received from [facility]   Hunger Vital Sign    Within the past 12 months, you worried that your food would run out before you got the money to buy more.: Never true    Within the past 12 months, the food you bought just didn't last and you didn't have money to get more.: Never true  Transportation Needs: No Transportation Needs (02/26/2024)   Received from [facility] - Transportation    In the past 12 months, has lack of transportation kept you from medical appointments or from getting medications?: No    Lack of Transportation (Non-Medical): No  Physical Activity: Not on file  Stress: Not on file  Social Connections: Not on file  Intimate Partner Violence: Not on file    Family History  Problem Relation Age of Onset   Hypertension Mother    COPD Mother    Heart disease Mother    Breast cancer Mother 69   Heart disease Father    COPD Father    Brain cancer Sister 7       tumor   Lung cancer Brother 74   Colon cancer Paternal Aunt        d. 61s   Juvenile Diabetes Son      Current Outpatient Medications:    allopurinol  (ZYLOPRIM ) 100 MG tablet, Take 100 mg by mouth daily. AM, Disp: , Rfl:    amLODipine (NORVASC) 5 MG tablet, Take 5 mg by mouth daily., Disp: , Rfl:    Apoaequorin (PREVAGEN EXTRA STRENGTH) 20 MG CAPS, Take by mouth.,  Disp: , Rfl:    Blood Glucose Monitoring Suppl (ONE TOUCH ULTRA 2) w/Device KIT, daily., Disp: , Rfl:    Calcium Carb-Cholecalciferol (CALCIUM 600 + D PO), Take 600 mg elemental calcium/kg/hr by mouth daily. , Disp: , Rfl:    clobetasol cream (TEMOVATE) 0.05 %, Apply 1  application topically 2 (two) times daily., Disp: , Rfl:    cyanocobalamin (VITAMIN B12) 1000 MCG tablet, Take 1,000 mcg by mouth daily., Disp: , Rfl:    diphenoxylate -atropine  (LOMOTIL ) 2.5-0.025 MG tablet, Take by mouth., Disp: , Rfl:    estradiol (ESTRACE VAGINAL) 0.1 MG/GM vaginal cream, Apply 1 finger tip full of great to urethra at night for 2 weeks then 3 times a week to prevent UTI, Disp: 42.5 g, Rfl: 12   EYSUVIS 0.25 % SUSP, Apply 1 drop to eye 4 (four) times daily., Disp: , Rfl:    famotidine  (PEPCID ) 20 MG tablet, Take 20 mg by mouth 2 (two) times daily., Disp: , Rfl:    fluticasone (FLONASE) 50 MCG/ACT nasal spray, Place 2 sprays into the nose daily., Disp: , Rfl:    furosemide  (LASIX ) 20 MG tablet, Take 20 mg by mouth every morning. AM, Disp: , Rfl:    ipratropium (ATROVENT) 0.03 % nasal spray, Place 2 sprays into both nostrils every 12 (twelve) hours., Disp: , Rfl:    Lancets (ONETOUCH DELICA PLUS LANCET30G) MISC, daily., Disp: , Rfl:    letrozole  (FEMARA ) 2.5 MG tablet, Take 1 tablet by mouth once daily, Disp: 90 tablet, Rfl: 0   levothyroxine  (SYNTHROID ) 88 MCG tablet, Take by mouth., Disp: , Rfl:    losartan (COZAAR) 100 MG tablet, Take 100 mg by mouth every morning., Disp: , Rfl:    metoprolol  (LOPRESSOR ) 50 MG tablet, Take 50 mg by mouth 2 (two) times daily., Disp: , Rfl:    ondansetron  (ZOFRAN -ODT) 4 MG disintegrating tablet, Take 1 tablet (4 mg total) by mouth every 8 (eight) hours as needed for nausea or vomiting., Disp: 20 tablet, Rfl: 0   ONETOUCH ULTRA TEST test strip, daily., Disp: , Rfl:    pantoprazole  (PROTONIX ) 20 MG tablet, Take 20 mg by mouth daily., Disp: , Rfl:    Potassium 99 MG TABS, Take 1  tablet by mouth daily., Disp: , Rfl:    diazepam (VALIUM) 5 MG tablet, Take 1 tablet (5 mg) 1 hour prior to MRI, may take second dose just before MRI if needed. (Patient not taking: Reported on 03/31/2024), Disp: , Rfl:    HYDROcodone -acetaminophen  (NORCO/VICODIN) 5-325 MG tablet, Take 1 tablet by mouth every 4 (four) hours as needed. (Patient not taking: Reported on 03/31/2024), Disp: 15 tablet, Rfl: 0   losartan (COZAAR) 50 MG tablet, Take 1 tablet by mouth at bedtime., Disp: , Rfl:    MIEBO 1.338 GM/ML SOLN, Apply 1 drop to eye 2 (two) times daily. (Patient not taking: Reported on 03/31/2024), Disp: , Rfl:    moxifloxacin (VIGAMOX) 0.5 % ophthalmic solution, Apply to eye. (Patient not taking: Reported on 03/31/2024), Disp: , Rfl:    Multiple Vitamin (MULTIVITAMIN) tablet, Take 1 tablet by mouth daily., Disp: , Rfl:    potassium chloride  (KLOR-CON ) 10 MEQ tablet, Take 10 mEq by mouth daily., Disp: , Rfl:    silver  sulfADIAZINE  (SILVADENE ) 1 % cream, Apply 1 Application topically daily. (Patient not taking: Reported on 02/05/2024), Disp: 50 g, Rfl: 1  Physical exam:  Vitals:   03/31/24 1005  BP: (!) 102/51  Pulse: (!) 58  Resp: 16  Temp: 98.2 F (36.8 C)  TempSrc: Tympanic  SpO2: 98%  Weight: 203 lb (92.1 kg)    Physical Exam Vitals reviewed.  Constitutional:      Appearance: She is not ill-appearing.  Cardiovascular:     Rate and Rhythm: Normal rate and regular rhythm.  Pulmonary:  Effort: Pulmonary effort is normal.     Breath sounds: Normal breath sounds.  Abdominal:     General: There is no distension.  Skin:    General: Skin is warm and dry.  Neurological:     Mental Status: She is alert and oriented to person, place, and time.  Psychiatric:        Mood and Affect: Mood normal.        Behavior: Behavior normal.     No labs onsite today  I have personally reviewed radiology images listed below: CLINICAL DATA:  Status post bilateral lumpectomy with radiation in April  2024. On letrozole .   EXAM: DIGITAL DIAGNOSTIC BILATERAL MAMMOGRAM WITH TOMOSYNTHESIS AND CAD   TECHNIQUE: Bilateral digital diagnostic mammography and breast tomosynthesis was performed. The images were evaluated with computer-aided detection.   COMPARISON:  Previous exam(s).   ACR Breast Density Category b: There are scattered areas of fibroglandular density.   FINDINGS: There is density and architectural distortion within the RIGHT breast, consistent with postsurgical changes. These are new in comparison to prior.   There is density and architectural distortion within the LEFT breast, consistent with postsurgical changes. These are new in comparison to prior.   No suspicious mass, distortion, or microcalcifications are identified to suggest presence of malignancy.   IMPRESSION: No mammographic evidence of malignancy bilaterally.   RECOMMENDATION: Recommend bilateral diagnostic mammogram (with RIGHT and LEFT breast ultrasound if deemed necessary) in 1 year.   I have discussed the findings and recommendations with the patient. If applicable, a reminder letter will be sent to the patient regarding the next appointment.   BI-RADS CATEGORY  2: Benign.     Electronically Signed   By: Corean Salter M.D.   On: 09/20/2023 11:49  No results found.    Assessment and plan- Patient is a 83 y.o. female who returns to clinic for routine follow-up of   Bilateral ER/PR positive HER2 negative breast cancer-status post bilateral lumpectomy with radiation in April 2024.  Given the ER/PR positivity she is now on endocrine therapy with letrozole .  She started letrozole  in late July 2024 and plan for 5 years of endocrine therapy completing in July 2029.  She will continue annual mammograms.  Last was March 2025 and reported as above as BI-RADS Category 2: Benign.  Plan to repeat in March 2026.  Clinically she is asymptomatic of recurrence today.  NED on exam.  Return to clinic in  6 months for surveillance or sooner if she has any concerning symptoms. Hypertension-at last visit she had elevated blood pressures and was requiring a third medication by her PCP.  We had discussed that letrozole  can have an adverse cardiovascular profile and cause hypertension in 6 to 8% of patients.  She held her aromatase inhibitor but blood pressures did not improve therefore, she restarted AI.  Bone health-baseline bone density scan from April 2024 showed T-score of -0.6, normal.  We reviewed that aromatase inhibitors can worsen bone density.  Recommend calcium 1200 mg and vitamin D 1000 mcg daily along with weightbearing exercise as tolerated.  Plan to repeat her bone density scan in April 2026. Dysuria- Collect UA today and culture. Plan to start antibiotics based on results. She has frequent UTI and I will start her on some estrace cream to urethra as she is likely suffering from hypoestrogen effects contributing to her frequent UTI.   Disposition: March 2026-mammogram 6 mo- Dr Melanee for breast surveillance- [place]   Visit Diagnosis 1. Encounter for  follow-up surveillance of breast cancer   2. Encounter for monitoring aromatase inhibitor therapy   3. Dysuria    Tinnie Dawn, DNP, AGNP-C, [facility] [phone] (clinic) 03/31/2024

## 2024-03-31 NOTE — Addendum Note (Signed)
 Addended by: JASMINE DELON POUR on: 03/31/2024 10:45 AM   Modules accepted: Orders

## 2024-03-31 NOTE — Progress Notes (Signed)
 Pt in for follow up, reports having UTI symptoms of burning and pain upon urination. Pt reports being fatigued and BP has been elevated.  Pt reports has taken BP meds this morning.

## 2024-04-01 ENCOUNTER — Other Ambulatory Visit: Payer: Self-pay | Admitting: Nurse Practitioner

## 2024-04-01 ENCOUNTER — Telehealth: Payer: Self-pay | Admitting: *Deleted

## 2024-04-01 MED ORDER — CEPHALEXIN 500 MG PO CAPS: 500.0000 mg | ORAL_CAPSULE | Freq: Two times a day (BID) | ORAL | 0 refills | Status: AC

## 2024-04-01 NOTE — Telephone Encounter (Signed)
 Attempted to reach patient via cell phone. No answer. Unable to leave vm- mail box is full. Left vm on 'work number'-listed as [phone].  Per Lauren-UA was consistent with UTI. Provider sent keflex script to pt's pharmacy.

## 2024-04-01 NOTE — Telephone Encounter (Signed)
 RN spoke with pt's daughter, Dorothe. I let her know that rx was sent into pharmacy for uti- [facility] on [address]. She stated that she would let her mother know.

## 2024-04-01 NOTE — Progress Notes (Signed)
 UA consistent with UTI. Awaiting sensitivity. Cephalexin 500 mg BID x 7 days sent to pharmacy. Start estrogen topical for prophylaxis. If ongoing symptoms, consider urology consultation.

## 2024-04-02 LAB — URINE CULTURE: Culture: >=100000 — AB

## 2024-04-03 ENCOUNTER — Ambulatory Visit: Payer: Self-pay | Admitting: *Deleted

## 2024-05-05 ENCOUNTER — Other Ambulatory Visit: Payer: Self-pay | Admitting: Oncology

## 2024-08-06 ENCOUNTER — Other Ambulatory Visit: Payer: Self-pay | Admitting: Oncology

## 2024-08-06 DIAGNOSIS — C50411 Malignant neoplasm of upper-outer quadrant of right female breast: Secondary | ICD-10-CM

## 2024-09-21 ENCOUNTER — Other Ambulatory Visit

## 2024-09-21 ENCOUNTER — Encounter

## 2024-09-28 ENCOUNTER — Ambulatory Visit: Admitting: Oncology

## 2024-09-29 ENCOUNTER — Ambulatory Visit: Admitting: Oncology
